# Patient Record
Sex: Female | Born: 1960 | Race: White | Hispanic: No | Marital: Married | Smoking: Current every day smoker
Health system: Southern US, Community
[De-identification: ages and names within clinical notes are randomized; demographics above are authoritative.]

## PROBLEM LIST (undated history)

## (undated) DIAGNOSIS — M47816 Spondylosis without myelopathy or radiculopathy, lumbar region: Secondary | ICD-10-CM

## (undated) DIAGNOSIS — E559 Vitamin D deficiency, unspecified: Secondary | ICD-10-CM

## (undated) DIAGNOSIS — K589 Irritable bowel syndrome without diarrhea: Secondary | ICD-10-CM

## (undated) DIAGNOSIS — E785 Hyperlipidemia, unspecified: Secondary | ICD-10-CM

## (undated) DIAGNOSIS — G589 Mononeuropathy, unspecified: Secondary | ICD-10-CM

## (undated) DIAGNOSIS — K219 Gastro-esophageal reflux disease without esophagitis: Secondary | ICD-10-CM

## (undated) DIAGNOSIS — R1031 Right lower quadrant pain: Secondary | ICD-10-CM

## (undated) DIAGNOSIS — M5430 Sciatica, unspecified side: Secondary | ICD-10-CM

## (undated) DIAGNOSIS — Z9889 Other specified postprocedural states: Secondary | ICD-10-CM

## (undated) DIAGNOSIS — S149XXA Injury of unspecified nerves of neck, initial encounter: Secondary | ICD-10-CM

## (undated) DIAGNOSIS — G43909 Migraine, unspecified, not intractable, without status migrainosus: Secondary | ICD-10-CM

## (undated) DIAGNOSIS — E7800 Pure hypercholesterolemia, unspecified: Secondary | ICD-10-CM

## (undated) DIAGNOSIS — M1990 Unspecified osteoarthritis, unspecified site: Secondary | ICD-10-CM

## (undated) DIAGNOSIS — Z8489 Family history of other specified conditions: Secondary | ICD-10-CM

## (undated) DIAGNOSIS — R112 Nausea with vomiting, unspecified: Secondary | ICD-10-CM

## (undated) HISTORY — DX: Gastro-esophageal reflux disease without esophagitis: K21.9

## (undated) HISTORY — PX: CARPAL TUNNEL RELEASE: SHX101

## (undated) HISTORY — PX: FOOT SURGERY: SHX648

## (undated) HISTORY — DX: Mononeuropathy, unspecified: G58.9

## (undated) HISTORY — PX: SKIN GRAFT: SHX250

## (undated) HISTORY — DX: Migraine, unspecified, not intractable, without status migrainosus: G43.909

## (undated) HISTORY — DX: Irritable bowel syndrome, unspecified: K58.9

## (undated) HISTORY — DX: Pure hypercholesterolemia, unspecified: E78.00

## (undated) HISTORY — DX: Spondylosis without myelopathy or radiculopathy, lumbar region: M47.816

## (undated) HISTORY — DX: Sciatica, unspecified side: M54.30

## (undated) HISTORY — DX: Vitamin D deficiency, unspecified: E55.9

## (undated) HISTORY — PX: DILATION AND CURETTAGE OF UTERUS: SHX78

## (undated) HISTORY — PX: FRACTURE SURGERY: SHX138

## (undated) HISTORY — DX: Hyperlipidemia, unspecified: E78.5

## (undated) HISTORY — DX: Right lower quadrant pain: R10.31

## (undated) HISTORY — PX: CHOLECYSTECTOMY: SHX55

---

## 1998-04-13 ENCOUNTER — Emergency Department (HOSPITAL_COMMUNITY): Admission: EM | Admit: 1998-04-13 | Discharge: 1998-04-13 | Payer: Self-pay | Admitting: Emergency Medicine

## 1998-04-14 ENCOUNTER — Encounter: Payer: Self-pay | Admitting: Emergency Medicine

## 1998-10-07 ENCOUNTER — Inpatient Hospital Stay (HOSPITAL_COMMUNITY): Admission: AD | Admit: 1998-10-07 | Discharge: 1998-10-07 | Payer: Self-pay | Admitting: Obstetrics & Gynecology

## 1998-10-14 ENCOUNTER — Inpatient Hospital Stay (HOSPITAL_COMMUNITY): Admission: AD | Admit: 1998-10-14 | Discharge: 1998-10-14 | Payer: Self-pay | Admitting: Obstetrics

## 1998-10-14 ENCOUNTER — Encounter: Payer: Self-pay | Admitting: Obstetrics

## 1998-10-25 ENCOUNTER — Emergency Department (HOSPITAL_COMMUNITY): Admission: EM | Admit: 1998-10-25 | Discharge: 1998-10-25 | Payer: Self-pay | Admitting: Emergency Medicine

## 1998-10-25 ENCOUNTER — Encounter: Payer: Self-pay | Admitting: Emergency Medicine

## 1998-12-09 ENCOUNTER — Other Ambulatory Visit: Admission: RE | Admit: 1998-12-09 | Discharge: 1998-12-09 | Payer: Self-pay | Admitting: Obstetrics

## 1998-12-16 ENCOUNTER — Encounter (INDEPENDENT_AMBULATORY_CARE_PROVIDER_SITE_OTHER): Payer: Self-pay | Admitting: Specialist

## 1998-12-16 ENCOUNTER — Encounter: Payer: Self-pay | Admitting: Obstetrics

## 1998-12-16 ENCOUNTER — Ambulatory Visit (HOSPITAL_COMMUNITY): Admission: AD | Admit: 1998-12-16 | Discharge: 1998-12-16 | Payer: Self-pay | Admitting: Internal Medicine

## 1999-11-08 ENCOUNTER — Observation Stay (HOSPITAL_COMMUNITY): Admission: RE | Admit: 1999-11-08 | Discharge: 1999-11-09 | Payer: Self-pay | Admitting: *Deleted

## 1999-11-08 ENCOUNTER — Encounter: Payer: Self-pay | Admitting: *Deleted

## 2001-02-05 ENCOUNTER — Emergency Department (HOSPITAL_COMMUNITY): Admission: EM | Admit: 2001-02-05 | Discharge: 2001-02-05 | Payer: Self-pay | Admitting: Emergency Medicine

## 2001-10-28 ENCOUNTER — Ambulatory Visit (HOSPITAL_COMMUNITY): Admission: RE | Admit: 2001-10-28 | Discharge: 2001-10-28 | Payer: Self-pay | Admitting: Obstetrics and Gynecology

## 2001-10-28 ENCOUNTER — Encounter: Payer: Self-pay | Admitting: Obstetrics and Gynecology

## 2002-05-22 ENCOUNTER — Emergency Department (HOSPITAL_COMMUNITY): Admission: EM | Admit: 2002-05-22 | Discharge: 2002-05-22 | Payer: Self-pay | Admitting: Emergency Medicine

## 2002-11-09 ENCOUNTER — Ambulatory Visit (HOSPITAL_COMMUNITY): Admission: RE | Admit: 2002-11-09 | Discharge: 2002-11-09 | Payer: Self-pay | Admitting: Obstetrics and Gynecology

## 2007-09-30 ENCOUNTER — Emergency Department (HOSPITAL_COMMUNITY): Admission: EM | Admit: 2007-09-30 | Discharge: 2007-09-30 | Payer: Self-pay | Admitting: Emergency Medicine

## 2010-08-05 ENCOUNTER — Other Ambulatory Visit: Payer: Self-pay | Admitting: Nurse Practitioner

## 2010-08-05 ENCOUNTER — Other Ambulatory Visit (HOSPITAL_COMMUNITY)
Admission: RE | Admit: 2010-08-05 | Discharge: 2010-08-05 | Disposition: A | Discharge status: Home / Self Care | Payer: Medicaid Other | Source: Ambulatory Visit | Attending: Unknown Physician Specialty | Admitting: Unknown Physician Specialty

## 2010-08-05 ENCOUNTER — Other Ambulatory Visit (HOSPITAL_COMMUNITY)
Admission: RE | Admit: 2010-08-05 | Discharge: 2010-08-05 | Disposition: A | Discharge status: Home / Self Care | Payer: Medicaid Other | Source: Ambulatory Visit | Attending: Nurse Practitioner | Admitting: Nurse Practitioner

## 2010-08-05 DIAGNOSIS — R87610 Atypical squamous cells of undetermined significance on cytologic smear of cervix (ASC-US): Secondary | ICD-10-CM | POA: Insufficient documentation

## 2010-08-05 DIAGNOSIS — N870 Mild cervical dysplasia: Secondary | ICD-10-CM | POA: Insufficient documentation

## 2011-01-15 LAB — COMPREHENSIVE METABOLIC PANEL
AST: 22
BUN: 11
CO2: 29
Calcium: 9.5
Chloride: 105
Creatinine, Ser: 0.8
GFR calc non Af Amer: >60
Glucose, Bld: 100 — ABNORMAL HIGH
Total Bilirubin: 0.7

## 2011-01-15 LAB — TYPE AND SCREEN
ABO/RH(D): A NEG
Antibody Screen: NEGATIVE

## 2011-01-15 LAB — DIFFERENTIAL
Basophils Absolute: 0.1
Eosinophils Relative: 1
Lymphocytes Relative: 21
Lymphs Abs: 2.1
Neutrophils Relative %: 70

## 2011-01-15 LAB — CBC
HCT: 43.8
Hemoglobin: 15.4 — ABNORMAL HIGH
MCHC: 35.2
MCV: 89.8
RBC: 4.89
WBC: 10

## 2013-08-30 ENCOUNTER — Telehealth: Payer: Self-pay | Admitting: *Deleted

## 2013-08-30 NOTE — Telephone Encounter (Signed)
Pt wants to schedule a colonoscopy. Please advise [phone]

## 2013-09-05 NOTE — Telephone Encounter (Signed)
Tried to call with no answer  

## 2013-09-05 NOTE — Telephone Encounter (Signed)
Gastroenterology Pre-Procedure Review  Request Date: Requesting Physician: Robterson  PATIENT REVIEW QUESTIONS: The patient responded to the following health history questions as indicated:    1. Diabetes Melitis: NO 2. Joint replacements in the past 12 months: NO 3. Major health problems in the past 3 months: NO 4. Has an artificial valve or MVP: NO 5. Has a defibrillator: NO 6. Has been advised in past to take antibiotics in advance of a procedure like teeth cleaning: NO 7.Family history: YES,father passed in 2008 from colon cancer 8.[place] :No    MEDICATIONS & ALLERGIES:     Patient reports the following regarding taking any blood thinners:   Plavix? NO Aspirin? NO Coumadin? NO  Patient confirms/reports the following medications:  No current outpatient prescriptions on file.   No current facility-administered medications for this visit.    Patient confirms/reports the following allergies:  Allergies not on file  No orders of the defined types were placed in this encounter.    AUTHORIZATION INFORMATION Primary Insurance: [employer]   ID #: [id number]  Group #:  Pre-Cert / Berkley HarveyAuth required:  Pre-Cert / Auth #:   Secondary Insurance: Medicare  ID #: [id number]: [id number]   Group #:  Pre-Cert / Auth required: Pre-Cert / Auth #:   SCHEDULE INFORMATION: Procedure has been scheduled as follows:  Date: , Time:   Location:   This Gastroenterology Pre-Precedure Review Form is being routed to the following provider(s):

## 2013-09-07 NOTE — Telephone Encounter (Signed)
I called pt to give her appt for the colonoscopy. She said she wants Dr. Karilyn Cotaehman and I gave her his phone number. I am faxing a note to PCP.

## 2013-09-27 ENCOUNTER — Encounter (INDEPENDENT_AMBULATORY_CARE_PROVIDER_SITE_OTHER): Payer: Self-pay | Admitting: *Deleted

## 2013-10-12 ENCOUNTER — Other Ambulatory Visit (INDEPENDENT_AMBULATORY_CARE_PROVIDER_SITE_OTHER): Payer: Self-pay | Admitting: *Deleted

## 2013-10-12 ENCOUNTER — Telehealth (INDEPENDENT_AMBULATORY_CARE_PROVIDER_SITE_OTHER): Payer: Self-pay | Admitting: *Deleted

## 2013-10-12 ENCOUNTER — Ambulatory Visit (INDEPENDENT_AMBULATORY_CARE_PROVIDER_SITE_OTHER): Payer: Medicare HMO | Admitting: Internal Medicine

## 2013-10-12 ENCOUNTER — Encounter (INDEPENDENT_AMBULATORY_CARE_PROVIDER_SITE_OTHER): Payer: Self-pay | Admitting: Internal Medicine

## 2013-10-12 VITALS — BP 120/64 | HR 72 | Temp 97.6°F | Ht 66.0 in | Wt 240.7 lb

## 2013-10-12 DIAGNOSIS — Z1211 Encounter for screening for malignant neoplasm of colon: Secondary | ICD-10-CM

## 2013-10-12 DIAGNOSIS — R1310 Dysphagia, unspecified: Secondary | ICD-10-CM | POA: Diagnosis not present

## 2013-10-12 DIAGNOSIS — E7800 Pure hypercholesterolemia, unspecified: Secondary | ICD-10-CM | POA: Diagnosis not present

## 2013-10-12 MED ORDER — PEG-KCL-NACL-NASULF-NA ASC-C 100 G PO SOLR: 1.0000 | Freq: Once | ORAL | Status: DC

## 2013-10-12 NOTE — Progress Notes (Signed)
Subjective:     Patient ID: Shelly Hanson, female   DOB: 09/14/1960, 53 y.o.   MRN: [id number]  HPI [facility]phagia and screening colonoscopy.  She tells me in 2008 she says she had dysphagia and was dilated by Dr. Karilyn Cotaehman. When she eats, foods are slow to go down. She is having problems swallowing. Symptoms for at least 6 months. When she is lying down, sometimes she cannot catch her breath or swallow.  Meats in particular give her trouble swallowing. She has never undergone a colonoscopy in the past. Appetite for the most part is good. She does have dysphagia. Acid reflux controlled with Omeprazole. No abdominal pain. She usually has a BM every day. No melena. Occasional see blood when she has to strain to have a BM.   05/10/2012 total bili 0.4, ALP 104, AST 18, ALT 19  EGD/ED 01/28/2007: Dr. 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 Small sliding hiatal hernia.   Review of Systems Past Medical History  Diagnosis Date  . High cholesterol     Past Surgical History  Procedure Laterality Date  . Dilation and curettage of uterus      x 2   . Foot surgery      for a fx.   . Cholecystectomy      Allergies  Allergen Reactions  . Codeine   . Flagyl [Metronidazole]   . Sulfa Antibiotics     Current Outpatient Prescriptions on File Prior to Visit  Medication Sig Dispense Refill  . gabapentin (NEURONTIN) 300 MG capsule Take 600 mg by mouth 3 (three) times daily.      . medroxyPROGESTERone (PROVERA) 5 MG tablet Take 5 mg by mouth daily.      . meloxicam (MOBIC) 15 MG tablet Take 15 mg by mouth daily.      Marland Kitchen. omeprazole (PRILOSEC) 40 MG capsule Take 40 mg by mouth daily.      . rosuvastatin (CRESTOR) 20 MG tablet Take 20 mg by mouth daily.      . Vitamin D, Ergocalciferol, (DRISDOL) 50000 UNITS CAPS capsule Take 50,000 Units by mouth every 7 (seven) days.       No current facility-administered  medications on file prior to visit.    Widowed. No children. She is disabled.     Objective:   Physical Exam  Filed Vitals:   10/12/13 0955  BP: 120/64  Pulse: 72  Temp: 97.6 F (36.4 C)  Height: 5\' 6"  (1.676 m)  Weight: 240 lb 11.2 oz (109.181 kg)   Alert and oriented. Skin warm and dry. Oral mucosa is moist.   . Sclera anicteric, conjunctivae is pink. Thyroid not enlarged. No cervical lymphadenopathy. Lungs clear. Heart regular rate and rhythm.  Abdomen is soft. Bowel sounds are positive. No hepatomegaly. No abdominal masses felt. No tenderness.  No edema to lower extremities.        Assessment:    Solid foods dysphagia. Stricture needs to be ruled out. In need of screening colonoscopy.     Plan:     EGD/ED. Screening colonoscopy.The risks and benefits such as perforation, bleeding, and infection were reviewed with the patient and is agreeable.

## 2013-10-12 NOTE — Telephone Encounter (Signed)
Patient needs movi prep 

## 2013-10-12 NOTE — Patient Instructions (Signed)
EGD/ED, Colonoscopy. -The risks and benefits such as perforation, bleeding, and infection were reviewed with the patient and is agreeable. 

## 2013-11-07 ENCOUNTER — Encounter (HOSPITAL_COMMUNITY): Payer: Self-pay | Admitting: Pharmacy Technician

## 2013-11-17 ENCOUNTER — Ambulatory Visit (HOSPITAL_COMMUNITY)
Admission: RE | Admit: 2013-11-17 | Discharge: 2013-11-17 | Disposition: A | Discharge status: Home / Self Care | Payer: Medicare HMO | Source: Ambulatory Visit | Attending: Internal Medicine | Admitting: Internal Medicine

## 2013-11-17 ENCOUNTER — Encounter (HOSPITAL_COMMUNITY)
Admission: RE | Disposition: A | Discharge status: Home / Self Care | Payer: Self-pay | Source: Ambulatory Visit | Attending: Internal Medicine

## 2013-11-17 ENCOUNTER — Encounter (HOSPITAL_COMMUNITY): Payer: Self-pay | Admitting: *Deleted

## 2013-11-17 DIAGNOSIS — Z800 Family history of malignant neoplasm of digestive organs: Secondary | ICD-10-CM | POA: Insufficient documentation

## 2013-11-17 DIAGNOSIS — Z79899 Other long term (current) drug therapy: Secondary | ICD-10-CM | POA: Diagnosis not present

## 2013-11-17 DIAGNOSIS — E7800 Pure hypercholesterolemia, unspecified: Secondary | ICD-10-CM | POA: Diagnosis not present

## 2013-11-17 DIAGNOSIS — K449 Diaphragmatic hernia without obstruction or gangrene: Secondary | ICD-10-CM | POA: Diagnosis not present

## 2013-11-17 DIAGNOSIS — K222 Esophageal obstruction: Secondary | ICD-10-CM | POA: Insufficient documentation

## 2013-11-17 DIAGNOSIS — K219 Gastro-esophageal reflux disease without esophagitis: Secondary | ICD-10-CM | POA: Insufficient documentation

## 2013-11-17 DIAGNOSIS — Z1211 Encounter for screening for malignant neoplasm of colon: Secondary | ICD-10-CM | POA: Diagnosis not present

## 2013-11-17 DIAGNOSIS — K2960 Other gastritis without bleeding: Secondary | ICD-10-CM | POA: Diagnosis not present

## 2013-11-17 DIAGNOSIS — R1310 Dysphagia, unspecified: Secondary | ICD-10-CM

## 2013-11-17 DIAGNOSIS — F17200 Nicotine dependence, unspecified, uncomplicated: Secondary | ICD-10-CM | POA: Insufficient documentation

## 2013-11-17 HISTORY — DX: Mononeuropathy, unspecified: G58.9

## 2013-11-17 HISTORY — PX: BALLOON DILATION: SHX5330

## 2013-11-17 HISTORY — PX: ESOPHAGOGASTRODUODENOSCOPY: SHX5428

## 2013-11-17 HISTORY — PX: MALONEY DILATION: SHX5535

## 2013-11-17 HISTORY — PX: SAVORY DILATION: SHX5439

## 2013-11-17 HISTORY — PX: COLONOSCOPY: SHX5424

## 2013-11-17 HISTORY — DX: Injury of unspecified nerves of neck, initial encounter: S14.9XXA

## 2013-11-17 SURGERY — COLONOSCOPY
Anesthesia: Moderate Sedation

## 2013-11-17 MED ORDER — SIMETHICONE 40 MG/0.6ML PO SUSP
ORAL | Status: DC | PRN
  Administered 2013-11-17: 10:00:00

## 2013-11-17 MED ORDER — MIDAZOLAM HCL 5 MG/5ML IJ SOLN
INTRAMUSCULAR | Status: AC
  Filled 2013-11-17: qty 10

## 2013-11-17 MED ORDER — MEPERIDINE HCL 50 MG/ML IJ SOLN
INTRAMUSCULAR | Status: DC | PRN
  Administered 2013-11-17 (×2): 25 mg via INTRAVENOUS

## 2013-11-17 MED ORDER — SODIUM CHLORIDE 0.9 % IV SOLN
INTRAVENOUS | Status: DC
  Administered 2013-11-17: 10:00:00 via INTRAVENOUS

## 2013-11-17 MED ORDER — MEPERIDINE HCL 50 MG/ML IJ SOLN
INTRAMUSCULAR | Status: AC
  Filled 2013-11-17: qty 1

## 2013-11-17 MED ORDER — MIDAZOLAM HCL 5 MG/5ML IJ SOLN
INTRAMUSCULAR | Status: DC | PRN
  Administered 2013-11-17 (×6): 2 mg via INTRAVENOUS

## 2013-11-17 MED ORDER — BUTAMBEN-TETRACAINE-BENZOCAINE 2-2-14 % EX AERO
INHALATION_SPRAY | CUTANEOUS | Status: DC | PRN
  Administered 2013-11-17: 1 via TOPICAL

## 2013-11-17 MED ORDER — MIDAZOLAM HCL 5 MG/5ML IJ SOLN
INTRAMUSCULAR | Status: AC
  Filled 2013-11-17: qty 5

## 2013-11-17 NOTE — H&P (Signed)
Shelly Hanson is an 53 y.o. female.   Chief Complaint: Patient is here for EGD, ED and colonoscopy. HPI: Patient is 53 year old Caucasian female who has history of erosive reflux esophagitis and esophageal stricture who presents with intermittent solid dysphagia. She has no difficulty with thin liquids. Heart is well controlled with her. Esophageal stricture last dilated in October 2008. She has done well until 2 months ago. She denies abdominal pain melena or rectal bleeding. Family history is positive for coronary carcinoma and father who is 16 at the time of diagnosis and died 34 years later of metastatic disease.  Past Medical History  Diagnosis Date  . High cholesterol   . GERD (gastroesophageal reflux disease)   . Pinched nerve in neck     Past Surgical History  Procedure Laterality Date  . Foot surgery      for a fx.   . Cholecystectomy    . Dilation and curettage of uterus      x 2     Family History  Problem Relation Age of Onset  . Colon cancer Father    Social History:  reports that she has been smoking Cigarettes.  She has a 5 pack-year smoking history. She does not have any smokeless tobacco history on file. She reports that she does not drink alcohol or use illicit drugs.  Allergies:  Allergies  Allergen Reactions  . Codeine   . Flagyl [Metronidazole]   . Sulfa Antibiotics     Medications Prior to Admission  Medication Sig Dispense Refill  . aspirin-acetaminophen-caffeine (EXCEDRIN MIGRAINE) 250-250-65 MG per tablet Take 1 tablet by mouth every 6 (six) hours as needed for headache.       . gabapentin (NEURONTIN) 300 MG capsule Take 600 mg by mouth 3 (three) times daily.      Marland Kitchen GINSENG PO Take 1 tablet by mouth daily.       . medroxyPROGESTERone (PROVERA) 5 MG tablet Take 5 mg by mouth daily.      . meloxicam (MOBIC) 15 MG tablet Take 15 mg by mouth daily.      . Multiple Vitamin (MULTIVITAMIN) tablet Take 1 tablet by mouth daily.      . Multiple  Vitamins-Minerals (PRESERVISION AREDS PO) Take 1 capsule by mouth daily.      . Omega-3 Fatty Acids (FISH OIL) 1000 MG CAPS Take 1 capsule by mouth daily.       Marland Kitchen omeprazole (PRILOSEC) 40 MG capsule Take 40 mg by mouth daily.      . peg 3350 powder (MOVIPREP) 100 G SOLR Take 1 kit (200 g total) by mouth once.  1 kit  0  . rosuvastatin (CRESTOR) 20 MG tablet Take 20 mg by mouth daily.      . Vitamin D, Ergocalciferol, (DRISDOL) 50000 UNITS CAPS capsule Take 50,000 Units by mouth every 7 (seven) days. Takes on Saturdays.        No results found for this or any previous visit (from the past 48 hour(s)). No results found.  ROS  Blood pressure 119/75, pulse 72, temperature 97.8 F (36.6 C), temperature source Oral, resp. rate 18, height '5\' 6"'  (1.676 m), weight 240 lb (108.863 kg), SpO2 96.00%. Physical Exam  Constitutional: She appears well-developed and well-nourished.  HENT:  Mouth/Throat: Oropharynx is clear and moist.  Eyes: Conjunctivae are normal. No scleral icterus.  Neck: No thyromegaly present.  Cardiovascular: Normal rate, regular rhythm and normal heart sounds.   No murmur heard. Respiratory: Effort normal and breath  sounds normal.  GI: Soft. She exhibits no distension and no mass. There is no tenderness.  Musculoskeletal: She exhibits no edema.  Lymphadenopathy:    She has no cervical adenopathy.  Neurological: She is alert.  Skin: Skin is warm and dry.     Assessment/Plan Solid food dysphagia in patient with chronic GERD and esophageal stricture. Family history of colon carcinoma and father at age 79. EGD with ED and screening colonoscopy.   Beckett Hickmon U 11/17/2013, 10:16 AM

## 2013-11-17 NOTE — Op Note (Signed)
EGD PROCEDURE REPORT  PATIENT:  Shelly Hanson  MR#:  [id number] Birthdate:  1960-12-17, 53 y.o., female Endoscopist:  Dr. Malissa Hippo, MD Referred By:  Mr. Quinn Axe, [facility]  Procedure Date: 11/17/2013  Procedure:   EGD, ED & Colonoscopy  Indications:  The patient is a 52 year old Caucasian female with chronic GERD and history of esophageal stricture now presents with solid dysphagia. Heartburn is well controlled with PPI. She is also undergoing screening colonoscopy. Family history significant for CRC in father at age 55 and died of metastatic disease 5 years later.            Informed Consent:  The risks, benefits, alternatives & imponderables which include, but are not limited to, bleeding, infection, perforation, drug reaction and potential missed lesion have been reviewed.  The potential for biopsy, lesion removal, esophageal dilation, etc. have also been discussed.  Questions have been answered.  All parties agreeable.  Please see history & physical in medical record for more information.  Medications:  Demerol 50 mg IV Versed 12 mg IV Cetacaine spray topically for oropharyngeal anesthesia  EGD  Description of procedure:  The endoscope was introduced through the mouth and advanced to the second portion of the duodenum without difficulty or limitations. The mucosal surfaces were surveyed very carefully during advancement of the scope and upon withdrawal.  Findings:  Esophagus:  Mucosa of the esophagus was normal. Soft stricture noted at GE junction without erosions or ulceration. GEJ:  34 cm Hiatus:  38 cm Stomach:  Stomach was empty and distended very well with insufflation. Folds in  the proximal stomach were normal. Examination of mucosa at gastric body was normal. Linear streaks of erythema and erosions noted at antrum. Pyloric channel was patent. Angularis fundus and cardia were unremarkable. Hernia was easily seen on this view. Duodenum:  Normal bulbar and post  bulbar mucosa.  Therapeutic/Diagnostic Maneuvers Performed:   Distal esophageal stricture was dilated with  balloon dilator. Balloon dilator was advanced through the scope. The guidewire was pushed into gastric lumen. Balloon dilator was positioned across the stricture and insufflated to dilator to 15, 16.5 and finally 18 mm. Pressure was maintained for about 30 seconds and then pushed distally. No mucosal disruption induced.  COLONOSCOPY Description of procedure:  After a digital rectal exam was performed, that colonoscope was advanced from the anus through the rectum and colon to the area of the cecum, ileocecal valve and appendiceal orifice. The cecum was deeply intubated. These structures were well-seen and photographed for the record. From the level of the cecum and ileocecal valve, the scope was slowly and cautiously withdrawn. The mucosal surfaces were carefully surveyed utilizing scope tip to flexion to facilitate fold flattening as needed. The scope was pulled down into the rectum where a thorough exam including retroflexion was performed.  Findings:   Prep satisfactory. Normal mucosa of cecum, ileocecal valve, ascending colon, hepatic flexure, transverse colon, splenic flexure, descending and sigmoid colon. Normal mucosa of the rectum and anorectal junction.  Therapeutic/Diagnostic Maneuvers Performed:  None  Complications:  None  Cecal Withdrawal Time:  6 minutes  Impression:   EGD findings; Soft stricture at GE junction without changes of esophagitis. This stricture was dilated with balloon dilator to 18 mm. Moderate size sliding hiatal hernia. Erosive antral gastritis  Colonoscopy findings; Normal colonoscopy.  Comment; Patient was difficult to sedate; will use propofol for subsequent exams.  Recommendations:  Continue anti-reflux measures and omeprazole as before. H. pylori serology. Consider next colonoscopy  in 5 years.  REHMAN,NAJEEB U  11/17/2013 11:15 AM  CC:  Dr. Quinn AxeOBERTSON, ANTHONY T, PA-C & Dr. Bonnetta BarryNo ref. provider found

## 2013-11-17 NOTE — Discharge Instructions (Signed)
Resume usual medications and diet. No driving for 24 hours. Physician will call with the result of blood test. Colonoscopy, Care After Refer to this sheet in the next few weeks. These instructions provide you with information on caring for yourself after your procedure. Your health care provider may also give you more specific instructions. Your treatment has been planned according to current medical practices, but problems sometimes occur. Call your health care provider if you have any problems or questions after your procedure. WHAT TO EXPECT AFTER THE PROCEDURE  After your procedure, it is typical to have the following:  A small amount of blood in your stool.  Moderate amounts of gas and mild abdominal cramping or bloating. HOME CARE INSTRUCTIONS  Do not drive, operate machinery, or sign important documents for 24 hours.  You may shower and resume your regular physical activities, but move at a slower pace for the first 24 hours.  Take frequent rest periods for the first 24 hours.  Walk around or put a warm pack on your abdomen to help reduce abdominal cramping and bloating.  Drink enough fluids to keep your urine clear or pale yellow.  You may resume your normal diet as instructed by your health care provider. Avoid heavy or fried foods that are hard to digest.  Avoid drinking alcohol for 24 hours or as instructed by your health care provider.  Only take over-the-counter or prescription medicines as directed by your health care provider.  If a tissue sample (biopsy) was taken during your procedure:  Do not take aspirin or blood thinners for 7 days, or as instructed by your health care provider.  Do not drink alcohol for 7 days, or as instructed by your health care provider.  Eat soft foods for the first 24 hours. SEEK MEDICAL CARE IF: You have persistent spotting of blood in your stool 2-3 days after the procedure. SEEK IMMEDIATE MEDICAL CARE IF:  You have more than a  small spotting of blood in your stool.  You pass large blood clots in your stool.  Your abdomen is swollen (distended).  You have nausea or vomiting.  You have a fever.  You have increasing abdominal pain that is not relieved with medicine.  Hiatal Hernia A hiatal hernia occurs when part of your stomach slides above the muscle that separates your abdomen from your chest (diaphragm). You can be born with a hiatal hernia (congenital), or it may develop over time. In almost all cases of hiatal hernia, only the top part of the stomach pushes through.  Many people have a hiatal hernia with no symptoms. The larger the hernia, the more likely that you will have symptoms. In some cases, a hiatal hernia allows stomach acid to flow back into the tube that carries food from your mouth to your stomach (esophagus). This may cause heartburn symptoms. Severe heartburn symptoms may mean you have developed a condition called gastroesophageal reflux disease (GERD).  CAUSES  Hiatal hernias are caused by a weakness in the opening (hiatus) where your esophagus passes through your diaphragm to attach to the upper part of your stomach. You may be born with a weakness in your hiatus, or a weakness can develop. RISK FACTORS Older age is a major risk factor for a hiatal hernia. Anything that increases pressure on your diaphragm can also increase your risk of a hiatal hernia. This includes:  Pregnancy.  Excess weight.  Frequent constipation. SIGNS AND SYMPTOMS  People with a hiatal hernia often have no symptoms.  If symptoms develop, they are almost always caused by GERD. They may include:  Heartburn.  Belching.  Indigestion.  Trouble swallowing.  Coughing or wheezing.  Sore throat.  Hoarseness.  Chest pain. DIAGNOSIS  A hiatal hernia is sometimes found during an exam for another problem. Your health care provider may suspect a hiatal hernia if you have symptoms of GERD. Tests may be done to  diagnose GERD. These may include:  X-rays of your stomach or chest.  An upper gastrointestinal (GI) series. This is an X-ray exam of your GI tract involving the use of a chalky liquid that you swallow. The liquid shows up clearly on the X-ray.  Endoscopy. This is a procedure to look into your stomach using a thin, flexible tube that has a tiny camera and light on the end of it. TREATMENT  If you have no symptoms, you may not need treatment. If you have symptoms, treatment may include:  Dietary and lifestyle changes to help reduce GERD symptoms.  Medicines. These may include:  Over-the-counter antacids.  Medicines that make your stomach empty more quickly.  Medicines that block the production of stomach acid (H2 blockers).  Stronger medicines to reduce stomach acid (proton pump inhibitors).  You may need surgery to repair the hernia if other treatments are not helping. HOME CARE INSTRUCTIONS   Take all medicines as directed by your health care provider.  Quit smoking, if you smoke.  Try to achieve and maintain a healthy body weight.  Eat frequent small meals instead of three large meals a day. This keeps your stomach from getting too full.  Eat slowly.  Do not lie down right after eating.  Do noteat 1-2 hours before bed.   Do not drink beverages with caffeine. These include cola, coffee, cocoa, and tea.  Do not drink alcohol.  Avoid foods that can make symptoms of GERD worse. These may include:  Fatty foods.  Citrus fruits.  Other foods and drinks that contain acid.  Avoid putting pressure on your belly. Anything that puts pressure on your belly increases the amount of acid that may be pushed up into your esophagus.   Avoid bending over, especially after eating.  Raise the head of your bed by putting blocks under the legs. This keeps your head and esophagus higher than your stomach.  Do not wear tight clothing around your chest or stomach.  Try not to  strain when having a bowel movement, when urinating, or when lifting heavy objects. SEEK MEDICAL CARE IF:  Your symptoms are not controlled with medicines or lifestyle changes.  You are having trouble swallowing.  You have coughing or wheezing that will not go away. SEEK IMMEDIATE MEDICAL CARE IF:  Your pain is getting worse.  Your pain spreads to your arms, neck, jaw, teeth, or back.  You have shortness of breath.  You sweat for no reason.  You feel sick to your stomach (nauseous) or vomit.  You vomit blood.  You have bright red blood in your stools.  You have black, tarry stools.

## 2013-11-20 LAB — H. PYLORI ANTIBODY, IGG: H Pylori IgG: <0.4 {ISR}

## 2014-02-19 ENCOUNTER — Other Ambulatory Visit (HOSPITAL_COMMUNITY): Payer: Self-pay | Admitting: Respiratory Therapy

## 2014-02-19 DIAGNOSIS — R0681 Apnea, not elsewhere classified: Secondary | ICD-10-CM

## 2014-03-22 ENCOUNTER — Ambulatory Visit: Payer: Medicare HMO | Attending: Physician Assistant | Admitting: Sleep Medicine

## 2014-03-22 DIAGNOSIS — R0681 Apnea, not elsewhere classified: Secondary | ICD-10-CM

## 2014-03-22 DIAGNOSIS — R51 Headache: Secondary | ICD-10-CM | POA: Insufficient documentation

## 2014-03-22 DIAGNOSIS — R0683 Snoring: Secondary | ICD-10-CM | POA: Diagnosis present

## 2014-03-22 DIAGNOSIS — Z7982 Long term (current) use of aspirin: Secondary | ICD-10-CM | POA: Insufficient documentation

## 2014-03-22 DIAGNOSIS — Z79899 Other long term (current) drug therapy: Secondary | ICD-10-CM | POA: Diagnosis not present

## 2014-03-22 DIAGNOSIS — G4733 Obstructive sleep apnea (adult) (pediatric): Secondary | ICD-10-CM | POA: Diagnosis not present

## 2014-03-22 DIAGNOSIS — G4710 Hypersomnia, unspecified: Secondary | ICD-10-CM | POA: Insufficient documentation

## 2014-03-24 NOTE — Sleep Study (Signed)
  [facility] Shelly Limes A. Gerilyn Pilgrimoonquah, MD     [email or web address]        NOCTURNAL POLYSOMNOGRAM    LOCATION: SLEEP LAB FACILITY: [place]   PHYSICIAN: Hula Tasso A. Gerilyn Hanson, M.D.   DATE OF STUDY: 03/22/2014.   REFERRING PHYSICIAN: Ferdie PingAnthony Robertson.   INDICATIONS: The patient is a 53 year old female who presents with hypersomnia, snoring, daytime sleepiness and headaches.  MEDICATIONS:  Prior to Admission medications   Medication Sig Start Date End Date Taking? Authorizing Provider  aspirin-acetaminophen-caffeine (EXCEDRIN MIGRAINE) [id number] MG per tablet Take 1 tablet by mouth every 6 (six) hours as needed for headache.     Historical Provider, MD  gabapentin (NEURONTIN) 300 MG capsule Take 600 mg by mouth 3 (three) times daily.    Historical Provider, MD  GINSENG PO Take 1 tablet by mouth daily.     Historical Provider, MD  medroxyPROGESTERone (PROVERA) 5 MG tablet Take 5 mg by mouth daily.    Historical Provider, MD  meloxicam (MOBIC) 15 MG tablet Take 15 mg by mouth daily.    Historical Provider, MD  Multiple Vitamin (MULTIVITAMIN) tablet Take 1 tablet by mouth daily.    Historical Provider, MD  Multiple Vitamins-Minerals (PRESERVISION AREDS PO) Take 1 capsule by mouth daily.    Historical Provider, MD  Omega-3 Fatty Acids (FISH OIL) 1000 MG CAPS Take 1 capsule by mouth daily.     Historical Provider, MD  omeprazole (PRILOSEC) 40 MG capsule Take 40 mg by mouth daily.    Historical Provider, MD  rosuvastatin (CRESTOR) 20 MG tablet Take 20 mg by mouth daily.    Historical Provider, MD  Vitamin D, Ergocalciferol, (DRISDOL) 50000 UNITS CAPS capsule Take 50,000 Units by mouth every 7 (seven) days. Takes on Saturdays.    Historical Provider, MD      EPWORTH SLEEPINESS SCALE: 9.   BMI: 40.   ARCHITECTURAL SUMMARY: Total recording time was 484 minutes. Sleep efficiency 77 %. Sleep latency 15 minutes. REM latency 98 minutes. Stage NI 5 %, N2 56 % and N3 22 % and REM sleep 17 %.      RESPIRATORY DATA:  Baseline oxygen saturation is 96 %. The lowest saturation is 82 %. The diagnostic AHI is 10. The RDI is 11. The REM AHI is 24.  LIMB MOVEMENT SUMMARY: PLM index 0.   ELECTROCARDIOGRAM SUMMARY: Average heart rate is 71 with no significant dysrhythmias observed.   IMPRESSION:  1. 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 A formal CPAP titration recording is suggested.  Thanks for this referral.  Shavonna Corella A. Gerilyn Hanson, M.D. Diplomat, Biomedical engineerAmerican Board of Sleep Medicine.

## 2014-04-05 ENCOUNTER — Encounter (INDEPENDENT_AMBULATORY_CARE_PROVIDER_SITE_OTHER): Payer: Self-pay

## 2014-05-31 ENCOUNTER — Encounter (INDEPENDENT_AMBULATORY_CARE_PROVIDER_SITE_OTHER): Payer: Self-pay | Admitting: *Deleted

## 2014-06-14 ENCOUNTER — Other Ambulatory Visit (HOSPITAL_COMMUNITY): Payer: Self-pay | Admitting: Radiology

## 2014-06-14 DIAGNOSIS — G4730 Sleep apnea, unspecified: Secondary | ICD-10-CM

## 2014-08-27 ENCOUNTER — Encounter: Payer: Self-pay | Admitting: *Deleted

## 2014-09-03 ENCOUNTER — Encounter: Payer: Self-pay | Admitting: Obstetrics and Gynecology

## 2014-09-03 ENCOUNTER — Ambulatory Visit (INDEPENDENT_AMBULATORY_CARE_PROVIDER_SITE_OTHER): Payer: Medicare HMO | Admitting: Obstetrics and Gynecology

## 2014-09-03 VITALS — BP 120/82 | Ht 65.0 in | Wt 233.0 lb

## 2014-09-03 DIAGNOSIS — R1031 Right lower quadrant pain: Secondary | ICD-10-CM

## 2014-09-03 NOTE — Progress Notes (Signed)
Patient ID: Shelly GuessDebra Holley Hanson, female   DOB: 01/30/1961, 54 y.o.   MRN: [id number] Pt here today for abdominal and pain on her right side. Pt states that the pain in her right side comes and goes. Pt states that with certain positions during sex the pain in her right side is worse. Pt also wants to discuss the medroxyprogesterone and her hot flashes. Pt wants to dicsuss what to do about the pain and what her options are.
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
## 2014-09-04 DIAGNOSIS — R1031 Right lower quadrant pain: Secondary | ICD-10-CM | POA: Insufficient documentation

## 2014-09-04 HISTORY — DX: Right lower quadrant pain: R10.31

## 2014-09-10 ENCOUNTER — Other Ambulatory Visit (HOSPITAL_COMMUNITY): Payer: Self-pay | Admitting: Physician Assistant

## 2014-09-10 DIAGNOSIS — M5440 Lumbago with sciatica, unspecified side: Secondary | ICD-10-CM

## 2014-09-19 ENCOUNTER — Ambulatory Visit (HOSPITAL_COMMUNITY)
Admission: RE | Admit: 2014-09-19 | Discharge: 2014-09-19 | Disposition: A | Discharge status: Home / Self Care | Payer: Medicare HMO | Source: Ambulatory Visit | Attending: Physician Assistant | Admitting: Physician Assistant

## 2014-09-19 DIAGNOSIS — M5440 Lumbago with sciatica, unspecified side: Secondary | ICD-10-CM | POA: Diagnosis not present

## 2014-10-04 ENCOUNTER — Ambulatory Visit (INDEPENDENT_AMBULATORY_CARE_PROVIDER_SITE_OTHER): Payer: Medicare HMO | Admitting: Obstetrics and Gynecology

## 2014-10-04 ENCOUNTER — Encounter: Payer: Self-pay | Admitting: Obstetrics and Gynecology

## 2014-10-04 VITALS — BP 112/70 | Ht 65.0 in | Wt 230.0 lb

## 2014-10-04 DIAGNOSIS — N951 Menopausal and female climacteric states: Secondary | ICD-10-CM | POA: Diagnosis not present
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
## 2014-10-04 NOTE — Patient Instructions (Signed)
PLEASE SIGN UP FOR MYCHART !!!!  [facility] really wants you to be able to see the results of tests that you have taken in their offices and facilities. YOU CAN HELP.  Please sign up for [facility], the way we nurses, doctors, and other care providers can get information to you quickly and efficiently. At each visit to a [facility] facility, you are given a password that will allow you, for 30 days, to sign up for [facility]. Please use it to sign up,( or get your favorite teenager with computer skills to help you), and then medical results can be sent to you once the doctor or nurse reviews them and release the results to you.  You will also be able to send notes to your provider offices, when questions arise.  This is a tremendous opportunity for you to BECOME MORE INVOLVED IN YOUR OWN CARE.  After all, no one can do as much for your health and well being as you can do. This is one of the best ways you can become a partner with your health care team, and feel like you are more in charge of your own health.

## 2014-10-05 ENCOUNTER — Ambulatory Visit: Payer: Medicare HMO | Admitting: Obstetrics and Gynecology

## 2014-10-05 LAB — ESTRADIOL: Estradiol: 8.6 pg/mL

## 2014-10-16 ENCOUNTER — Ambulatory Visit (INDEPENDENT_AMBULATORY_CARE_PROVIDER_SITE_OTHER): Payer: Medicare HMO | Admitting: Internal Medicine

## 2014-10-16 ENCOUNTER — Telehealth: Payer: Self-pay | Admitting: Obstetrics and Gynecology

## 2014-10-18 ENCOUNTER — Telehealth: Payer: Self-pay | Admitting: Obstetrics and Gynecology

## 2014-10-18 NOTE — Telephone Encounter (Signed)
Pt states had labs at last visit for menopause, has not heard back in regards to labs and what she needs to take for hot flashes. Please advise.

## 2014-10-19 NOTE — Telephone Encounter (Signed)
Estradiol levels low, FSH not in record yet. Plan: will ask chrystal to confirm that lab was collected.

## 2014-10-24 NOTE — Telephone Encounter (Signed)
[facility]

## 2014-10-25 ENCOUNTER — Encounter (INDEPENDENT_AMBULATORY_CARE_PROVIDER_SITE_OTHER): Payer: Self-pay | Admitting: Internal Medicine

## 2014-10-25 ENCOUNTER — Telehealth: Payer: Self-pay | Admitting: *Deleted

## 2014-10-25 ENCOUNTER — Ambulatory Visit (INDEPENDENT_AMBULATORY_CARE_PROVIDER_SITE_OTHER): Payer: Medicare HMO | Admitting: Internal Medicine

## 2014-10-25 VITALS — BP 126/64 | HR 64 | Temp 97.9°F | Ht 65.5 in | Wt 227.8 lb

## 2014-10-25 DIAGNOSIS — R1314 Dysphagia, pharyngoesophageal phase: Secondary | ICD-10-CM

## 2014-10-25 DIAGNOSIS — Z800 Family history of malignant neoplasm of digestive organs: Secondary | ICD-10-CM | POA: Diagnosis not present

## 2014-10-25 DIAGNOSIS — K648 Other hemorrhoids: Secondary | ICD-10-CM | POA: Diagnosis not present

## 2014-10-25 DIAGNOSIS — N951 Menopausal and female climacteric states: Secondary | ICD-10-CM

## 2014-10-25 MED ORDER — HYDROCORTISONE 2.5 % RE CREA: 1.0000 "application " | TOPICAL_CREAM | Freq: Two times a day (BID) | RECTAL | Status: DC

## 2014-10-25 NOTE — Progress Notes (Signed)
Subjective:    Patient ID: Shelly Hanson, female    DOB: 09/16/1960, 54 y.o.   MRN: [id number]  HPI  Here today for f/u. Hx of dysphagia and underwent and EGD/ED last year. She also underwent a colonoscopy for family hx of colon cancer in father who died at age 56.  She tells me today her swallowing is good. Occasionally her hernia gives her trouble.  She says her hemorrhoids are bothering her at times when she strains to have a BM.  Appetite is good. She has had some weight loss which was intentional. Her acid reflux is controlled with PPI.  She usually has a BM daily. She has seen some blood with her BM.          11/17/2013 EGD, ED & Colonoscopy  Indications: The patient is a 54 year old Caucasian female with chronic GERD and history of esophageal stricture now presents with solid dysphagia. Heartburn is well controlled with PPI. She is also undergoing screening colonoscopy. Family history significant for CRC in father at age 56 and died of metastatic disease 5 years later.  EGD findings; Soft stricture at GE junction without changes of esophagitis. This stricture was dilated with balloon dilator to 18 mm. Moderate size sliding hiatal hernia. Erosive antral gastritis  Colonoscopy findings; Normal colonoscopy.  Review of Systems Past Medical History  Diagnosis Date  . High cholesterol   . GERD (gastroesophageal reflux disease)   . Pinched nerve in neck   . Vitamin D deficiency disease   . Hyperlipemia   . Sciatic leg pain   . Pinched nerve     pinched nerve in back     Past Surgical History  Procedure Laterality Date  . Foot surgery      for a fx.   . Cholecystectomy    . Dilation and curettage of uterus      x 2   . Colonoscopy N/A 11/17/2013    Procedure: COLONOSCOPY;  Surgeon: Malissa HippoNajeeb U Rehman, MD;  [facility];  Service:  Endoscopy;  Laterality: N/A;  1030  . Esophagogastroduodenoscopy N/A 11/17/2013    Procedure: ESOPHAGOGASTRODUODENOSCOPY (EGD);  Surgeon: Malissa HippoNajeeb U Rehman, MD;  [facility];  Service: Endoscopy;  Laterality: N/A;  . Balloon dilation N/A 11/17/2013    Procedure: BALLOON DILATION;  Surgeon: Malissa HippoNajeeb U Rehman, MD;  [facility];  Service: Endoscopy;  Laterality: N/A;  [name]. [name] N/A 11/17/2013    Procedure: Elease HashimotoMALONEY DILATION;  Surgeon: Malissa HippoNajeeb U Rehman, MD;  [facility];  Service: Endoscopy;  Laterality: N/A;  . Savory dilation N/A 11/17/2013    Procedure: SAVORY DILATION;  Surgeon: Malissa HippoNajeeb U Rehman, MD;  [facility];  Service: Endoscopy;  Laterality: N/A;    Allergies  Allergen Reactions  . Codeine   . Flagyl [Metronidazole]   . Sulfa Antibiotics     Current Outpatient Prescriptions on File Prior to Visit  Medication Sig Dispense Refill  . aspirin-acetaminophen-caffeine (EXCEDRIN MIGRAINE) 250-250-65 MG per tablet Take 1 tablet by mouth every 6 (six) hours as needed for headache.     . docusate sodium (COLACE) 100 MG capsule Take 100 mg by mouth daily.    . fluticasone (VERAMYST) 27.5 MCG/SPRAY nasal spray Place 2 sprays into the nose daily.    Marland Kitchen. gabapentin (NEURONTIN) 300 MG capsule Take 600 mg by mouth 3 (three) times daily.    Marland Kitchen. GINSENG PO Take 1 tablet by mouth daily.     Marland Kitchen. omeprazole (  PRILOSEC) 40 MG capsule Take 40 mg by mouth daily.    . ranitidine (ZANTAC) 150 MG tablet Take 150 mg by mouth 2 (two) times daily.    . rosuvastatin (CRESTOR) 20 MG tablet Take 20 mg by mouth daily.    . Vitamin D, Ergocalciferol, (DRISDOL) 50000 UNITS CAPS capsule Take 50,000 Units by mouth every 7 (seven) days. Takes on Saturdays.     No current facility-administered medications on file prior to visit.        Objective:   Physical Exam Blood pressure 126/64, pulse 64, temperature 97.9 F (36.6 C), height 5' 5.5" (1.664 m), weight 227 lb 12.8 oz  (103.329 kg). Alert and oriented. Skin warm and dry. Oral mucosa is moist.   . Sclera anicteric, conjunctivae is pink. Thyroid not enlarged. No cervical lymphadenopathy. Lungs clear. Heart regular rate and rhythm.  Abdomen is soft. Bowel sounds are positive. No hepatomegaly. No abdominal masses felt. No tenderness.  No edema to lower extremities.          Assessment & Plan:  Dysphaigia. Continue PPI Family hx of colon cancer. Next colonoscopy 4 yrs from now. Hemorrhoids: Anusol cream twice a day. Please call with a PR report next week.  OV in 1 year.

## 2014-10-25 NOTE — Patient Instructions (Addendum)
Continue Protonix OV in1 year. Take Anusol cream twice a day for your hemorrhoids

## 2014-10-25 NOTE — Telephone Encounter (Signed)
Pt aware that she needs to go by [facility] to have FSH drawn. Pt has another appointment today and will go by there after that.

## 2014-10-26 ENCOUNTER — Encounter (INDEPENDENT_AMBULATORY_CARE_PROVIDER_SITE_OTHER): Payer: Self-pay | Admitting: Internal Medicine

## 2014-10-26 LAB — FOLLICLE STIMULATING HORMONE: FSH: 45.8 m[IU]/mL

## 2014-10-29 ENCOUNTER — Telehealth: Payer: Self-pay | Admitting: Obstetrics and Gynecology

## 2014-10-29 NOTE — Telephone Encounter (Signed)
Pt wanted blood work results. Pt aware of lab results. Pt wanted to know if Dr. Emelda FearFerguson could change her medication for her hot flashes. The pt has a follow up appointment on the 29th with Dr. Emelda FearFerguson. I advised the pt that I would send this message to D.r Emelda FearFerguson and see if he wants to go ahead and give her medication to try.

## 2014-11-12 ENCOUNTER — Telehealth: Payer: Self-pay | Admitting: *Deleted

## 2014-11-12 MED ORDER — MEDROXYPROGESTERONE ACETATE 5 MG PO TABS: 5.0000 mg | ORAL_TABLET | Freq: Every day | ORAL | Status: DC

## 2014-11-12 MED ORDER — ESTRADIOL 1 MG PO TABS: 1.0000 mg | ORAL_TABLET | Freq: Every day | ORAL | Status: DC

## 2014-11-12 NOTE — Telephone Encounter (Signed)
Rx estradiol 1 mg daily continusous ly   Rx Provera   x 14 d q 3 months.

## 2014-11-12 NOTE — Telephone Encounter (Signed)
Pt states that her hot flashes are getting worse and now she is experiencing nausea. Pt states that she was taking meloxicam and Dr. Emelda Fear told her to stop taking it because it wasn't helping. Pt wants to know what she can do. Pt has appointment on this Friday.

## 2014-11-16 ENCOUNTER — Ambulatory Visit: Payer: Medicare HMO | Admitting: Obstetrics and Gynecology

## 2014-11-22 ENCOUNTER — Encounter: Payer: Self-pay | Admitting: Obstetrics and Gynecology

## 2014-11-22 ENCOUNTER — Ambulatory Visit (INDEPENDENT_AMBULATORY_CARE_PROVIDER_SITE_OTHER): Payer: Medicare HMO | Admitting: Obstetrics and Gynecology

## 2014-11-22 VITALS — BP 110/80 | HR 76 | Ht 65.0 in | Wt 225.0 lb

## 2014-11-22 DIAGNOSIS — N951 Menopausal and female climacteric states: Secondary | ICD-10-CM

## 2014-11-22 MED ORDER — ESTRADIOL 1 MG PO TABS: 1.0000 mg | ORAL_TABLET | Freq: Every day | ORAL | Status: DC

## 2014-11-22 NOTE — Progress Notes (Signed)
Patient ID: Shelly Hanson, female   DOB: 05-Jul-1960, 54 y.o.   MRN: [id number]    [facility]  Patient name: Shelly Hanson MRN [id number]  Date of birth: 1960/12/02  CC & HPI:  Shelly Hanson is a 54 y.o. post-menopausal female presenting today for continued, intermittent hot flashes and medication consultation. Pt was seen on 6/16 for the same, was taken off Meloxicam and was started on estradiol-1 mg daily and Provera-5 mg for 14 days every 3 months. She states that her hot flashes have become worse, and are now started on nausea, since starting on the new medications. Her last menses was 4 years ago. Pt denies dyspareunia.   ROS:  A complete 10 system review of systems was obtained and all systems are negative except as noted in the HPI and PMH.   Pertinent History Reviewed:   Reviewed. Medical         Past Medical History  Diagnosis Date   High cholesterol    GERD (gastroesophageal reflux disease)    Pinched nerve in neck    Vitamin D deficiency disease    Hyperlipemia    Sciatic leg pain    Pinched nerve     pinched nerve in back                               Surgical Hx:    Past Surgical History  Procedure Laterality Date   Foot surgery      for a fx.    Cholecystectomy     Dilation and curettage of uterus      x 2    Colonoscopy N/A 11/17/2013    Procedure: COLONOSCOPY;  Surgeon: Malissa Hippo, MD;  [facility];  Service: Endoscopy;  Laterality: N/A;  1030   Esophagogastroduodenoscopy N/A 11/17/2013    Procedure: ESOPHAGOGASTRODUODENOSCOPY (EGD);  Surgeon: Malissa Hippo, MD;  [facility];  Service: Endoscopy;  Laterality: N/A;   Balloon dilation N/A 11/17/2013    Procedure: BALLOON DILATION;  Surgeon: Malissa Hippo, MD;  [facility];  Service: Endoscopy;  Laterality: N/AElease Hashimoto dilation N/A 11/17/2013    Procedure: [name] DILATION;  Surgeon: Malissa Hippo, MD;  [facility];  Service: Endoscopy;  Laterality: N/A;   Savory dilation N/A 11/17/2013    Procedure: SAVORY DILATION;  Surgeon: Malissa Hippo, MD;  [facility];  Service: Endoscopy;  Laterality: N/A;   Medications: Reviewed & Updated - see associated section                       Current outpatient prescriptions:    aspirin-acetaminophen-caffeine (EXCEDRIN MIGRAINE) 250-250-65 MG per tablet, Take 1 tablet by mouth every 6 (six) hours as needed for headache. , Disp: , Rfl:    docusate sodium (COLACE) 100 MG capsule, Take 100 mg by mouth daily., Disp: , Rfl:    estradiol (ESTRACE) 1 MG tablet, Take 1 tablet (1 mg total) by mouth daily., Disp: 30 tablet, Rfl: 11   fluticasone (VERAMYST) 27.5 MCG/SPRAY nasal spray, Place 2 sprays into the nose daily., Disp: , Rfl:    gabapentin (NEURONTIN) 300 MG capsule, Take 600 mg by mouth 3 (three) times daily., Disp: , Rfl:    GINSENG PO, Take 1 tablet by mouth daily. , Disp: , Rfl:    hydrocortisone (ANUSOL-HC) 2.5 % rectal  cream, Place 1 application rectally 2 (two) times daily., Disp: 30 g, Rfl: 1   omeprazole (PRILOSEC) 40 MG capsule, Take 40 mg by mouth daily., Disp: , Rfl:    ranitidine (ZANTAC) 150 MG tablet, Take 150 mg by mouth 2 (two) times daily., Disp: , Rfl:    rosuvastatin (CRESTOR) 20 MG tablet, Take 20 mg by mouth daily., Disp: , Rfl:    Vitamin D, Ergocalciferol, (DRISDOL) 50000 UNITS CAPS capsule, Take 50,000 Units by mouth every 7 (seven) days. Takes on Saturdays., Disp: , Rfl:    medroxyPROGESTERone (PROVERA) 5 MG tablet, Take 1 tablet (5 mg total) by mouth daily. One tablet daily x 2 weeks. Repeat as directed every third month. (Patient not taking: Reported on 11/22/2014), Disp: 14 tablet, Rfl: 3   Social History: Reviewed -  reports that she has been smoking Cigarettes.  She has a 2.5 pack-year smoking history. She has never used smokeless tobacco.  Objective Findings:  Vitals: Blood pressure 110/80, pulse 76, height 5'  5" (1.651 m), weight 225 lb (102.059 kg).  Physical Examination: General appearance - alert, well appearing, and in no distress and oriented to person, place, and time Mental status - alert, oriented to person, place, and time, normal mood, behavior, speech, dress, motor activity, and thought processes  Discussion only 20 minutes regarding treatment plan, side effects and risks.   Assessment & Plan:   A:  1. Post-menopausal female 2. Intermittent hot flashes  P:  1. Pt to continue estradiol 1 mg and Provera  5mg  x 14 d q 3months  2. Follow-up, as needed or annually.    This chart was scribed for Tilda Burrow, MD by Gwenyth Ober, Medical Scribe. This patient was seen in room 1 and the patient's care was started at 1:49 PM.   I personally performed the services described in this documentation, which was SCRIBED in my presence. The recorded information has been reviewed and considered accurate. It has been edited as necessary during review. Tilda Burrow, MD

## 2014-12-13 ENCOUNTER — Telehealth: Payer: Self-pay | Admitting: Obstetrics and Gynecology

## 2014-12-13 NOTE — Telephone Encounter (Signed)
Pt states that the new medication that she was given for hot flashes has given her migraines, and makes her nauseated. Pt states that the hot flashes are better but still there. Pt states that she has taken excedrin migraine and that has not helped.

## 2014-12-17 ENCOUNTER — Telehealth: Payer: Self-pay | Admitting: Obstetrics and Gynecology

## 2014-12-18 ENCOUNTER — Telehealth: Payer: Self-pay | Admitting: Obstetrics and Gynecology

## 2014-12-18 DIAGNOSIS — G43909 Migraine, unspecified, not intractable, without status migrainosus: Secondary | ICD-10-CM

## 2014-12-18 DIAGNOSIS — G43009 Migraine without aura, not intractable, without status migrainosus: Secondary | ICD-10-CM

## 2014-12-18 HISTORY — DX: Migraine, unspecified, not intractable, without status migrainosus: G43.909

## 2014-12-18 MED ORDER — BUTALBITAL-APAP-CAFFEINE 50-325-40 MG PO TABS: 1.0000 | ORAL_TABLET | Freq: Four times a day (QID) | ORAL | Status: DC | PRN

## 2014-12-18 NOTE — Telephone Encounter (Signed)
I spoke with the pt and she stated that she still has the same headache from last week. Pt states that she really need something for the headache and would really like for Dr. Emelda Fear to give her a call about the problems she is having. I offered the pt an appointment but the pt refused and stated that she doesn't have any money or gas until Thursday. I advised the pt that Dr.Ferguson is not in the office until this afternoon and that I would try my best to get him to call her this afternoon. Pt verbalized understanding.

## 2014-12-18 NOTE — Telephone Encounter (Signed)
Chronic persistent headache x 5day, no vision changes, + dizzy today,   Has had infrequent headache had headache "not that many, , not relieved by excedrin Migraine this time. Having photophobia, and pain in eyes.  Will send rx for Fioricet to [place] [facility].,

## 2015-01-14 ENCOUNTER — Telehealth: Payer: Self-pay | Admitting: Obstetrics and Gynecology

## 2015-01-14 NOTE — Telephone Encounter (Signed)
Pt states that she is still having the headaches and they are lasting a few days at a time. Pt states that the hot flashes have not bad anymore but the headaches are still bad. Pt was advised she would need an appointment to discuss medications. Pt verbalized understanding. Phone call was switched to front office for appointment.

## 2015-01-18 ENCOUNTER — Ambulatory Visit (INDEPENDENT_AMBULATORY_CARE_PROVIDER_SITE_OTHER): Payer: Medicare HMO | Admitting: Obstetrics and Gynecology

## 2015-01-18 ENCOUNTER — Encounter: Payer: Self-pay | Admitting: Obstetrics and Gynecology

## 2015-01-18 VITALS — BP 118/70 | Ht 65.0 in | Wt 224.0 lb

## 2015-01-18 DIAGNOSIS — G44209 Tension-type headache, unspecified, not intractable: Secondary | ICD-10-CM | POA: Diagnosis not present

## 2015-01-18 MED ORDER — TRAMADOL HCL 50 MG PO TABS: 50.0000 mg | ORAL_TABLET | Freq: Four times a day (QID) | ORAL | Status: DC | PRN

## 2015-01-18 MED ORDER — BUTALBITAL-APAP-CAFFEINE 50-325-40 MG PO CAPS: 1.0000 | ORAL_CAPSULE | Freq: Four times a day (QID) | ORAL | Status: DC | PRN

## 2015-01-18 NOTE — Progress Notes (Signed)
Patient ID: Shelly Hanson, female   DOB: 04-19-1961, 54 y.o.   MRN: [id number]   [facility] Visit  Patient name: Shelly Hanson MRN [id number]  Date of birth: [birth year] CC & HPI:  Shelly Hanson is a 54 y.o. female, with last menses 4 years ago, presenting today for f/u on Medroxyprogest (  1x daily), Butalb-Acetamin (1-2 tabs daily), and Estradiol (  daily). Pt was started on said meds to control her hot flashes. Pt reports headaches with associated photophobia after starting said meds-- pt notes, however, she has been under increased stress lately due to family issues. Pt notes pulling sensation/pain to base of neck as well as scalp. At baseline, pt reports headaches approximately 2x per month. t reports using Excedrin Migraine at home without relief. Pt denies visual changes or visual disturbance. Pt further denies sleep disturbance due to her hot flashes. Pt reports her BP is normal.   Pt also complains of an ongoing circular, red bruise to LLE.   ROS:  10 Systems reviewed and all are negative for acute change except as noted in the HPI. Pertinent History Reviewed:   Reviewed: Significant for dilation and curettage of uterus,  Medical         Past Medical History  Diagnosis Date   High cholesterol    GERD (gastroesophageal reflux disease)    Pinched nerve in neck    Vitamin D deficiency disease    Hyperlipemia    Sciatic leg pain    Pinched nerve     pinched nerve in back                               Surgical Hx:    Past Surgical History  Procedure Laterality Date   Foot surgery      for a fx.    Cholecystectomy     Dilation and curettage of uterus      x 2    Colonoscopy N/A 11/17/2013    Procedure: COLONOSCOPY;  Surgeon: Malissa Hippo, MD;  [facility];  Service: Endoscopy;  Laterality: N/A;  1030   Esophagogastroduodenoscopy N/A 11/17/2013    Procedure: ESOPHAGOGASTRODUODENOSCOPY (EGD);  Surgeon: Malissa Hippo, MD;   [facility];  Service: Endoscopy;  Laterality: N/A;   Balloon dilation N/A 11/17/2013    Procedure: BALLOON DILATION;  Surgeon: Malissa Hippo, MD;  [facility];  Service: Endoscopy;  Laterality: N/AElease Hanson dilation N/A 11/17/2013    Procedure: Shelly Hanson DILATION;  Surgeon: Malissa Hippo, MD;  [facility];  Service: Endoscopy;  Laterality: N/A;   Savory dilation N/A 11/17/2013    Procedure: SAVORY DILATION;  Surgeon: Malissa Hippo, MD;  [facility];  Service: Endoscopy;  Laterality: N/A;   Medications: Reviewed & Updated - see associated section                       Current outpatient prescriptions:    aspirin-acetaminophen-caffeine (EXCEDRIN MIGRAINE) 250-250-65 MG per tablet, Take 1 tablet by mouth every 6 (six) hours as needed for headache. , Disp: , Rfl:    butalbital-acetaminophen-caffeine (FIORICET) 50-325-40 MG per tablet, Take 1-2 tablets by mouth every 6 (six) hours as needed for headache or migraine., Disp: 40 tablet, Rfl: 0   Butalbital-APAP-Caffeine (CAPACET) 50-325-40 MG capsule, Take 1 capsule by mouth every 6 (six) hours as needed for pain  or headache., Disp: 90 capsule, Rfl: 2   docusate sodium (COLACE) 100 MG capsule, Take 100 mg by mouth daily., Disp: , Rfl:    estradiol (ESTRACE) 1 MG tablet, Take 1 tablet (1 mg total) by mouth daily., Disp: 30 tablet, Rfl: 11   fluticasone (VERAMYST) 27.5 MCG/SPRAY nasal spray, Place 2 sprays into the nose daily., Disp: , Rfl:    gabapentin (NEURONTIN) 300 MG capsule, Take 600 mg by mouth 3 (three) times daily., Disp: , Rfl:    GINSENG PO, Take 1 tablet by mouth daily. , Disp: , Rfl:    hydrocortisone (ANUSOL-HC) 2.5 % rectal cream, Place 1 application rectally 2 (two) times daily., Disp: 30 g, Rfl: 1   medroxyPROGESTERone (PROVERA) 5 MG tablet, Take 1 tablet (5 mg total) by mouth daily. One tablet daily x 2 weeks. Repeat as directed every third month. (Patient not taking:  Reported on 11/22/2014), Disp: 14 tablet, Rfl: 3   omeprazole (PRILOSEC) 40 MG capsule, Take 40 mg by mouth daily., Disp: , Rfl:    ranitidine (ZANTAC) 150 MG tablet, Take 150 mg by mouth 2 (two) times daily., Disp: , Rfl:    rosuvastatin (CRESTOR) 20 MG tablet, Take 20 mg by mouth daily., Disp: , Rfl:    traMADol (ULTRAM) 50 MG tablet, Take 1 tablet (50 mg total) by mouth every 6 (six) hours as needed for moderate pain or severe pain., Disp: 30 tablet, Rfl: 0   Vitamin D, Ergocalciferol, (DRISDOL) 50000 UNITS CAPS capsule, Take 50,000 Units by mouth every 7 (seven) days. Takes on Saturdays., Disp: , Rfl:   Social History: Reviewed -  reports that she has been smoking Cigarettes.  She has a 2.5 pack-year smoking history. She has never used smokeless tobacco.  Objective Findings:  Vitals: Blood pressure 118/70, height  (1.651 m), weight 224 lb (101.606 kg).  Physical Examination: Office Visit for Discussion Only   Assessment & Plan:   A:  1. 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   P:  1. Continue Butalb-Acetamin  2. Rx for Fioricet  3. Tramadol for severe headaches when Fioricet does not work.  4. Continue estrogen  5. F/U in three months   By signing my name below, I, Marica Otter, attest that this documentation has been prepared under the direction and in the presence of Christin Bach, MD. Electronically Signed: Marica Otter, ED Scribe. 01/18/2015. 12:47 PM.   I personally performed the services described in this documentation, which was SCRIBED in my presence. The recorded information has been reviewed and considered accurate. It has been edited as necessary during review. Tilda Burrow, MD

## 2015-01-18 NOTE — Progress Notes (Signed)
Patient ID: Shelly Hanson, female   DOB: June 03, 1960, 54 y.o.   MRN: [id number] Pt here today for follow up on medications. Pt states that she is still having headaches.

## 2015-01-26 NOTE — Progress Notes (Signed)
Patient ID: Shelly Hanson, female   DOB: 1960-05-31, 54 y.o.   MRN: [id number]   [facility] Visit  Patient name: Shelly Hanson MRN [id number]  Date of birth: Sep 16, 1960 CC & HPI:  Shelly Hanson is a 54 y.o. female, with last menses 4 years ago, presenting today for f/u on Medroxyprogest (  1x daily), Butalb-Acetamin (1-2 tabs daily), and Estradiol (  daily). Pt was started on said meds to control her hot flashes. Pt reports headaches with associated photophobia after starting said meds-- pt notes, however, she has been under increased stress lately due to family issues. Pt notes pulling sensation/pain to base of neck as well as scalp. At baseline, pt reports headaches approximately 2x per month. t reports using Excedrin Migraine at home without relief. Pt denies visual changes or visual disturbance. Pt further denies sleep disturbance due to her hot flashes. Pt reports her BP is normal.   Pt also complains of an ongoing circular, red bruise to LLE.   ROS:  10 Systems reviewed and all are negative for acute change except as noted in the HPI. Pertinent History Reviewed:   Reviewed: Significant for dilation and curettage of uterus,  Medical         Past Medical History  Diagnosis Date  . High cholesterol   . GERD (gastroesophageal reflux disease)   . Pinched nerve in neck   . Vitamin D deficiency disease   . Hyperlipemia   . Sciatic leg pain   . Pinched nerve     pinched nerve in back                               Surgical Hx:    Past Surgical History  Procedure Laterality Date  . Foot surgery      for a fx.   . Cholecystectomy    . Dilation and curettage of uterus      x 2   . Colonoscopy N/A 11/17/2013    Procedure: COLONOSCOPY;  Surgeon: Malissa Hippo, MD;  [facility];  Service: Endoscopy;  Laterality: N/A;  1030  . Esophagogastroduodenoscopy N/A 11/17/2013    Procedure: ESOPHAGOGASTRODUODENOSCOPY (EGD);  Surgeon: Malissa Hippo, MD;   [facility];  Service: Endoscopy;  Laterality: N/A;  . Balloon dilation N/A 11/17/2013    Procedure: BALLOON DILATION;  Surgeon: Malissa Hippo, MD;  [facility];  Service: Endoscopy;  Laterality: N/A;  [name] dilation N/A 11/17/2013    Procedure: [name] DILATION;  Surgeon: Malissa Hippo, MD;  [facility];  Service: Endoscopy;  Laterality: N/A;  . Savory dilation N/A 11/17/2013    Procedure: SAVORY DILATION;  Surgeon: Malissa Hippo, MD;  [facility];  Service: Endoscopy;  Laterality: N/A;   Medications: Reviewed & Updated - see associated section                       Current outpatient prescriptions:  .  aspirin-acetaminophen-caffeine (EXCEDRIN MIGRAINE) 250-250-65 MG per tablet, Take 1 tablet by mouth every 6 (six) hours as needed for headache. , Disp: , Rfl:  .  butalbital-acetaminophen-caffeine (FIORICET) 50-325-40 MG per tablet, Take 1-2 tablets by mouth every 6 (six) hours as needed for headache or migraine., Disp: 40 tablet, Rfl: 0 .  Butalbital-APAP-Caffeine (CAPACET) 50-325-40 MG capsule, Take 1 capsule by mouth every 6 (six) hours as needed for pain  or headache., Disp: 90 capsule, Rfl: 2 °•  docusate sodium (COLACE) 100 MG capsule, Take 100 mg by mouth daily., Disp: , Rfl:  °•  estradiol (ESTRACE) 1 MG tablet, Take 1 tablet (1 mg total) by mouth daily., Disp: 30 tablet, Rfl: 11 °•  fluticasone (VERAMYST) 27.5 MCG/SPRAY nasal spray, Place 2 sprays into the nose daily., Disp: , Rfl:  °•  gabapentin (NEURONTIN) 300 MG capsule, Take 600 mg by mouth 3 (three) times daily., Disp: , Rfl:  °•  GINSENG PO, Take 1 tablet by mouth daily. , Disp: , Rfl:  °•  hydrocortisone (ANUSOL-HC) 2.5 % rectal cream, Place 1 application rectally 2 (two) times daily., Disp: 30 g, Rfl: 1 °•  medroxyPROGESTERone (PROVERA) 5 MG tablet, Take 1 tablet (5 mg total) by mouth daily. One tablet daily x 2 weeks. Repeat as directed every third month. (Patient not taking:  Reported on 11/22/2014), Disp: 14 tablet, Rfl: 3 °•  omeprazole (PRILOSEC) 40 MG capsule, Take 40 mg by mouth daily., Disp: , Rfl:  °•  ranitidine (ZANTAC) 150 MG tablet, Take 150 mg by mouth 2 (two) times daily., Disp: , Rfl:  °•  rosuvastatin (CRESTOR) 20 MG tablet, Take 20 mg by mouth daily., Disp: , Rfl:  °•  traMADol (ULTRAM) 50 MG tablet, Take 1 tablet (50 mg total) by mouth every 6 (six) hours as needed for moderate pain or severe pain., Disp: 30 tablet, Rfl: 0 °•  Vitamin D, Ergocalciferol, (DRISDOL) 50000 UNITS CAPS capsule, Take 50,000 Units by mouth every 7 (seven) days. Takes on Saturdays., Disp: , Rfl:  ° °Social History: Reviewed -  reports that she has been smoking Cigarettes.  She has a 2.5 pack-year smoking history. She has never used smokeless tobacco. ° °Objective Findings:  °Vitals: Blood pressure 118/70, height 5' 5" (1.651 m), weight 224 lb (101.606 kg). ° °Physical Examination: Office Visit for Discussion Only   °Assessment & Plan:  ° °A:  °1. 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  ° °P:  °1. Continue Butalb-Acetamin  °2. Rx for Fioricet  °3. Tramadol for severe headaches when Fioricet does not work.  °4. Continue estrogen  °5. F/U in three months  ° °By signing my name below, I, Nusrat Rahman, attest that this documentation has been prepared under the direction and in the presence of Suhey Radford, MD. °Electronically Signed: Nusrat Rahman, ED Scribe. 01/18/2015. 12:47 PM. ° ° °I personally performed the services described in this documentation, which was SCRIBED in my presence. The recorded information has been reviewed and considered accurate. It has been edited as necessary during review. °Jeanpierre Thebeau V, MD ° °  ° °

## 2015-02-04 ENCOUNTER — Other Ambulatory Visit: Payer: Self-pay | Admitting: Obstetrics and Gynecology

## 2015-02-04 MED ORDER — ESTRADIOL 1 MG PO TABS
1.0000 mg | ORAL_TABLET | Freq: Every day | ORAL | Status: DC
Start: 2015-02-04 — End: 2015-10-18

## 2015-02-04 MED ORDER — BUTALBITAL-APAP-CAFFEINE 50-325-40 MG PO CAPS: 1.0000 | ORAL_CAPSULE | Freq: Four times a day (QID) | ORAL | Status: DC | PRN

## 2015-02-04 MED ORDER — TRAMADOL HCL 50 MG PO TABS: 50.0000 mg | ORAL_TABLET | Freq: Four times a day (QID) | ORAL | Status: DC | PRN

## 2015-04-24 ENCOUNTER — Ambulatory Visit: Payer: Medicare HMO | Admitting: Obstetrics and Gynecology

## 2015-05-27 ENCOUNTER — Other Ambulatory Visit: Payer: Self-pay | Admitting: Obstetrics and Gynecology

## 2015-05-27 DIAGNOSIS — Z1231 Encounter for screening mammogram for malignant neoplasm of breast: Secondary | ICD-10-CM

## 2015-05-29 ENCOUNTER — Ambulatory Visit (INDEPENDENT_AMBULATORY_CARE_PROVIDER_SITE_OTHER): Payer: Medicare HMO | Admitting: Obstetrics and Gynecology

## 2015-05-29 ENCOUNTER — Encounter: Payer: Self-pay | Admitting: Obstetrics and Gynecology

## 2015-05-29 ENCOUNTER — Other Ambulatory Visit (HOSPITAL_COMMUNITY)
Admission: RE | Admit: 2015-05-29 | Discharge: 2015-05-29 | Disposition: A | Discharge status: Home / Self Care | Payer: Medicare HMO | Source: Ambulatory Visit | Attending: Obstetrics and Gynecology | Admitting: Obstetrics and Gynecology

## 2015-05-29 VITALS — BP 110/76 | Ht 66.0 in | Wt 219.5 lb

## 2015-05-29 DIAGNOSIS — Z1151 Encounter for screening for human papillomavirus (HPV): Secondary | ICD-10-CM | POA: Insufficient documentation

## 2015-05-29 DIAGNOSIS — Z01419 Encounter for gynecological examination (general) (routine) without abnormal findings: Secondary | ICD-10-CM | POA: Diagnosis present

## 2015-05-29 DIAGNOSIS — Z124 Encounter for screening for malignant neoplasm of cervix: Secondary | ICD-10-CM

## 2015-05-29 NOTE — Progress Notes (Signed)
Patient ID: Shelly Hanson, female   DOB: 28-Mar-1961, 55 y.o.   MRN: [id number] Pt here today for pap only.

## 2015-05-29 NOTE — Progress Notes (Signed)
Patient ID: Shelly Hanson, female   DOB: 06-14-1960, 55 y.o.   MRN: [id number]  Assessment:  Annual Gyn Exam 1. Normal GYN exam   Plan:  1. pap smear done, next pap due in 3 years.  2. return annually or prn 3    Annual mammogram advised Subjective:  Shelly Hanson is a 55 y.o. female G2P0020 who presents for annual exam. No LMP recorded. Patient is postmenopausal. The patient complains of intermittent episodes of frequent urge to urinate which is worsened with intercourse. Pt denies any other Sx at this time.   The date of pt's last pap smear is unknown.The patient reports she is also having a mammogram completed on Monday.   The following portions of the patient's history were reviewed and updated as appropriate: allergies, current medications, past family history, past medical history, past social history, past surgical history and problem list. Past Medical History  Diagnosis Date  . High cholesterol   . GERD (gastroesophageal reflux disease)   . Pinched nerve in neck   . Vitamin D deficiency disease   . Hyperlipemia   . Sciatic leg pain   . Pinched nerve     pinched nerve in back     Past Surgical History  Procedure Laterality Date  . Foot surgery      for a fx.   . Cholecystectomy    . Dilation and curettage of uterus      x 2   . Colonoscopy N/A 11/17/2013    Procedure: COLONOSCOPY;  Surgeon: Malissa Hippo, MD;  [facility];  Service: Endoscopy;  Laterality: N/A;  1030  . Esophagogastroduodenoscopy N/A 11/17/2013    Procedure: ESOPHAGOGASTRODUODENOSCOPY (EGD);  Surgeon: Malissa Hippo, MD;  [facility];  Service: Endoscopy;  Laterality: N/A;  . Balloon dilation N/A 11/17/2013    Procedure: BALLOON DILATION;  Surgeon: Malissa Hippo, MD;  [facility];  Service: Endoscopy;  Laterality: N/A;  [name] dilation N/A 11/17/2013    Procedure: [name] DILATION;  Surgeon: Malissa Hippo, MD;  [facility];  Service:  Endoscopy;  Laterality: N/A;  . Savory dilation N/A 11/17/2013    Procedure: SAVORY DILATION;  Surgeon: Malissa Hippo, MD;  [facility];  Service: Endoscopy;  Laterality: N/A;     Current outpatient prescriptions:  .  Butalbital-APAP-Caffeine (CAPACET) 50-325-40 MG capsule, Take 1 capsule by mouth every 6 (six) hours as needed for pain or headache., Disp: 90 capsule, Rfl: 2 .  docusate sodium (COLACE) 100 MG capsule, Take 100 mg by mouth daily., Disp: , Rfl:  .  estradiol (ESTRACE) 1 MG tablet, Take 1 tablet (1 mg total) by mouth daily., Disp: 30 tablet, Rfl: 11 .  fluticasone (VERAMYST) 27.5 MCG/SPRAY nasal spray, Place 2 sprays into the nose daily., Disp: , Rfl:  .  gabapentin (NEURONTIN) 300 MG capsule, Take 600 mg by mouth 3 (three) times daily., Disp: , Rfl:  .  GINSENG PO, Take 1 tablet by mouth daily. , Disp: , Rfl:  .  hydrocortisone (ANUSOL-HC) 2.5 % rectal cream, Place 1 application rectally 2 (two) times daily., Disp: 30 g, Rfl: 1 .  medroxyPROGESTERone (PROVERA) 5 MG tablet, Take 1 tablet (5 mg total) by mouth daily. One tablet daily x 2 weeks. Repeat as directed every third month., Disp: 14 tablet, Rfl: 3 .  omeprazole (PRILOSEC) 40 MG capsule, Take 40 mg by mouth daily., Disp: , Rfl:  .  ranitidine (ZANTAC)  150 MG tablet, Take 150 mg by mouth 2 (two) times daily., Disp: , Rfl:  .  rosuvastatin (CRESTOR) 20 MG tablet, Take 20 mg by mouth daily., Disp: , Rfl:  .  traMADol (ULTRAM) 50 MG tablet, Take 1 tablet (50 mg total) by mouth every 6 (six) hours as needed for moderate pain or severe pain., Disp: 30 tablet, Rfl: 0 .  Vitamin D, Ergocalciferol, (DRISDOL) 50000 UNITS CAPS capsule, Take 50,000 Units by mouth every 7 (seven) days. Takes on Saturdays., Disp: , Rfl:  .  aspirin-acetaminophen-caffeine (EXCEDRIN MIGRAINE) 250-250-65 MG per tablet, Take 1 tablet by mouth every 6 (six) hours as needed for headache. , Disp: , Rfl:  .  meloxicam (MOBIC) 15 MG tablet, , Disp: ,  Rfl:   Review of Systems Constitutional: negative  Gastrointestinal: negative Genitourinary: frequent urge to urinate   Objective:  BP 110/76 mmHg  Ht  (1.676 m)  Wt 219 lb 8 oz (99.565 kg)  BMI 35.45 kg/m2   BMI: Body mass index is 35.45 kg/(m^2).  General Appearance: Alert, appropriate appearance for age. No acute distress HEENT: Grossly normal Neck / Thyroid:  Cardiovascular: RRR; normal S1, S2, no murmur Lungs: CTA bilaterally Back: No CVAT Gastrointestinal: Soft, non-tender, no masses or organomegaly Pelvic Exam: Vulva and vagina appear normal. Bimanual exam reveals normal uterus and adnexa. External genitalia: normal general appearance Urinary system: urethral meatus normal Vaginal: normal mucosa without prolapse or lesions, normal without tenderness, induration or masses and normal rugae Cervix: normal appearance Adnexa: normal bimanual exam Uterus: normal single, nontender, mid-position and tiny and well supported Rectal: good sphincter tone, no masses and guaiac negative Rectovaginal: normal rectal, no masses Lymphatic Exam: Non-palpable nodes in neck, clavicular, axillary, or inguinal regions  Skin: no rash or abnormalities Neurologic: Normal gait and speech, no tremor  Psychiatric: Alert and oriented, appropriate affect.  Urinalysis:Not done  Christin Bach. MD Pgr [phone] 2:29 PM    By signing my name below, I, Marica Otter, attest that this documentation has been prepared under the direction and in the presence of Christin Bach, MD. Electronically Signed: Marica Otter, ED Scribe. 05/29/2015. 2:29 PM.   I personally performed the services described in this documentation, which was SCRIBED in my presence. The recorded information has been reviewed and considered accurate. It has been edited as necessary during review. Tilda Burrow, MD

## 2015-06-03 ENCOUNTER — Telehealth: Payer: Self-pay | Admitting: *Deleted

## 2015-06-03 ENCOUNTER — Ambulatory Visit (HOSPITAL_COMMUNITY)
Admission: RE | Admit: 2015-06-03 | Discharge: 2015-06-03 | Disposition: A | Discharge status: Home / Self Care | Payer: Medicare HMO | Source: Ambulatory Visit | Attending: Obstetrics and Gynecology | Admitting: Obstetrics and Gynecology

## 2015-06-03 DIAGNOSIS — Z1231 Encounter for screening mammogram for malignant neoplasm of breast: Secondary | ICD-10-CM | POA: Insufficient documentation

## 2015-06-03 LAB — CYTOLOGY - PAP

## 2015-06-03 NOTE — Telephone Encounter (Signed)
Pt aware of her results 

## 2015-06-10 ENCOUNTER — Telehealth: Payer: Self-pay | Admitting: Obstetrics and Gynecology

## 2015-06-11 NOTE — Telephone Encounter (Signed)
Pt informed of normal mammogram results

## 2015-07-12 ENCOUNTER — Emergency Department (HOSPITAL_COMMUNITY): Payer: Medicare HMO

## 2015-07-12 ENCOUNTER — Encounter (HOSPITAL_COMMUNITY): Payer: Self-pay | Admitting: Emergency Medicine

## 2015-07-12 ENCOUNTER — Emergency Department (HOSPITAL_COMMUNITY)
Admission: EM | Admit: 2015-07-12 | Discharge: 2015-07-12 | Disposition: A | Discharge status: Home / Self Care | Payer: Medicare HMO | Attending: Emergency Medicine | Admitting: Emergency Medicine

## 2015-07-12 DIAGNOSIS — R197 Diarrhea, unspecified: Secondary | ICD-10-CM | POA: Insufficient documentation

## 2015-07-12 DIAGNOSIS — F17210 Nicotine dependence, cigarettes, uncomplicated: Secondary | ICD-10-CM | POA: Insufficient documentation

## 2015-07-12 DIAGNOSIS — K2970 Gastritis, unspecified, without bleeding: Secondary | ICD-10-CM | POA: Insufficient documentation

## 2015-07-12 DIAGNOSIS — Z79899 Other long term (current) drug therapy: Secondary | ICD-10-CM | POA: Diagnosis not present

## 2015-07-12 DIAGNOSIS — R1013 Epigastric pain: Secondary | ICD-10-CM | POA: Diagnosis present

## 2015-07-12 DIAGNOSIS — E785 Hyperlipidemia, unspecified: Secondary | ICD-10-CM | POA: Diagnosis not present

## 2015-07-12 LAB — COMPREHENSIVE METABOLIC PANEL WITH GFR
ALT: 21 U/L (ref 14–54)
AST: 26 U/L (ref 15–41)
Albumin: 4.4 g/dL (ref 3.5–5.0)
Alkaline Phosphatase: 83 U/L (ref 38–126)
Anion gap: 9 (ref 5–15)
BUN: 24 mg/dL — ABNORMAL HIGH (ref 6–20)
CO2: 26 mmol/L (ref 22–32)
Calcium: 9.1 mg/dL (ref 8.9–10.3)
Chloride: 104 mmol/L (ref 101–111)
Creatinine, Ser: 0.68 mg/dL (ref 0.44–1.00)
GFR calc Af Amer: >60 mL/min
GFR calc non Af Amer: >60 mL/min
Glucose, Bld: 109 mg/dL — ABNORMAL HIGH (ref 65–99)
Potassium: 4 mmol/L (ref 3.5–5.1)
Sodium: 139 mmol/L (ref 135–145)
Total Bilirubin: 0.8 mg/dL (ref 0.3–1.2)
Total Protein: 7.4 g/dL (ref 6.5–8.1)

## 2015-07-12 LAB — CBC WITH DIFFERENTIAL/PLATELET
Basophils Absolute: 0 10*3/uL (ref 0.0–0.1)
Basophils Relative: 1 %
Eosinophils Absolute: 0.1 10*3/uL (ref 0.0–0.7)
Eosinophils Relative: 2 %
HCT: 45.4 % (ref 36.0–46.0)
Hemoglobin: 15.5 g/dL — ABNORMAL HIGH (ref 12.0–15.0)
Lymphocytes Relative: 27 %
Lymphs Abs: 2 10*3/uL (ref 0.7–4.0)
MCH: 31.6 pg (ref 26.0–34.0)
MCHC: 34.1 g/dL (ref 30.0–36.0)
MCV: 92.5 fL (ref 78.0–100.0)
Monocytes Absolute: 0.5 10*3/uL (ref 0.1–1.0)
Monocytes Relative: 7 %
Neutro Abs: 4.9 10*3/uL (ref 1.7–7.7)
Neutrophils Relative %: 63 %
Platelets: 193 10*3/uL (ref 150–400)
RBC: 4.91 MIL/uL (ref 3.87–5.11)
RDW: 13 % (ref 11.5–15.5)
WBC: 7.6 10*3/uL (ref 4.0–10.5)

## 2015-07-12 LAB — URINALYSIS, ROUTINE W REFLEX MICROSCOPIC
BILIRUBIN URINE: NEGATIVE
Glucose, UA: NEGATIVE mg/dL
HGB URINE DIPSTICK: NEGATIVE
Ketones, ur: 15 mg/dL — AB
Leukocytes, UA: NEGATIVE
NITRITE: NEGATIVE
Protein, ur: NEGATIVE mg/dL
SPECIFIC GRAVITY, URINE: 1.01 (ref 1.005–1.030)
pH: 8.5 — ABNORMAL HIGH (ref 5.0–8.0)

## 2015-07-12 LAB — LIPASE, BLOOD: LIPASE: 20 U/L (ref 11–51)

## 2015-07-12 MED ORDER — IOHEXOL 300 MG/ML  SOLN
100.0000 mL | Freq: Once | INTRAMUSCULAR | Status: AC | PRN
  Administered 2015-07-12: 100 mL via INTRAVENOUS

## 2015-07-12 MED ORDER — IOHEXOL 300 MG/ML  SOLN
25.0000 mL | Freq: Once | INTRAMUSCULAR | Status: AC | PRN
  Administered 2015-07-12: 25 mL via ORAL

## 2015-07-12 MED ORDER — SODIUM CHLORIDE 0.9 % IV BOLUS (SEPSIS)
1000.0000 mL | Freq: Once | INTRAVENOUS | Status: AC
  Administered 2015-07-12: 1000 mL via INTRAVENOUS

## 2015-07-12 MED ORDER — PANTOPRAZOLE SODIUM 40 MG IV SOLR
40.0000 mg | Freq: Once | INTRAVENOUS | Status: AC
  Administered 2015-07-12: 40 mg via INTRAVENOUS
  Filled 2015-07-12: qty 40

## 2015-07-12 MED ORDER — ONDANSETRON HCL 4 MG/2ML IJ SOLN
4.0000 mg | Freq: Once | INTRAMUSCULAR | Status: AC
  Administered 2015-07-12: 4 mg via INTRAVENOUS
  Filled 2015-07-12: qty 2

## 2015-07-12 MED ORDER — HYDROMORPHONE HCL 1 MG/ML IJ SOLN
1.0000 mg | Freq: Once | INTRAMUSCULAR | Status: AC
  Administered 2015-07-12: 1 mg via INTRAVENOUS
  Filled 2015-07-12: qty 1

## 2015-07-12 NOTE — Discharge Instructions (Signed)
Take your Ultram for pain for your belly as needed. Increase her Prilosec to twice a day. Follow-up with her family doctor next week

## 2015-07-12 NOTE — ED Provider Notes (Addendum)
CSN: [id number]     Arrival date & time 07/12/15  1102 History  By signing my name below, I, Shelly Hanson, attest that this documentation has been prepared under the direction and in the presence of Shelly Berkshire, MD. Electronically Signed: [name], ED Scribe. 07/12/2015. 12:12 PM.   Chief Complaint  Patient presents with  . Abdominal Pain   Patient is a 55 y.o. female presenting with abdominal pain. The history is provided by the patient. No language interpreter was used.  Abdominal Pain Pain location:  Epigastric and LUQ Pain quality: aching and sharp   Pain severity:  Severe Timing:  Intermittent Progression:  Worsening Chronicity:  Chronic Associated symptoms: diarrhea   Associated symptoms: no chest pain, no cough, no fatigue and no hematuria    PCP: [facility] HPI Comments: Shelly Hanson is a 55 y.o. female, with  Hx of hiatal hernia, who presents to the Emergency Department complaining of abd pain. Associated Sx include diarrhea and urinary frequency onset three days ago.  Past Medical History  Diagnosis Date  . High cholesterol   . GERD (gastroesophageal reflux disease)   . Pinched nerve in neck   . Vitamin D deficiency disease   . Hyperlipemia   . Sciatic leg pain   . Pinched nerve     pinched nerve in back    Past Surgical History  Procedure Laterality Date  . Foot surgery      for a fx.   . Cholecystectomy    . Dilation and curettage of uterus      x 2   . Colonoscopy N/A 11/17/2013    Procedure: COLONOSCOPY;  Surgeon: Shelly Hippo, MD;  [facility];  Service: Endoscopy;  Laterality: N/A;  1030  . Esophagogastroduodenoscopy N/A 11/17/2013    Procedure: ESOPHAGOGASTRODUODENOSCOPY (EGD);  Surgeon: Shelly Hippo, MD;  [facility];  Service: Endoscopy;  Laterality: N/A;  . Balloon dilation N/A 11/17/2013    Procedure: BALLOON DILATION;  Surgeon: Shelly Hippo, MD;  [facility];  Service:  Endoscopy;  Laterality: N/A;  Shelly Hanson dilation N/A 11/17/2013    Procedure: Shelly Hanson DILATION;  Surgeon: Shelly Hippo, MD;  [facility];  Service: Endoscopy;  Laterality: N/A;  . Savory dilation N/A 11/17/2013    Procedure: SAVORY DILATION;  Surgeon: Shelly Hippo, MD;  [facility];  Service: Endoscopy;  Laterality: N/A;   Family History  Problem Relation Age of Onset  . Colon cancer Father   . Hypertension Father   . Seizures Father   . Cancer Father   . Depression Mother   . Diabetes Mother   . Hyperlipidemia Mother   . Hyperlipidemia Brother   . Heart disease Brother   . Hyperlipidemia Brother    Social History  Substance Use Topics  . Smoking status: Current Every Day Smoker -- 0.25 packs/day for 10 years    Types: Cigarettes  . Smokeless tobacco: Never Used     Comment: 1 pack every 3 days greater than 10 yrs  . Alcohol Use: No   OB History    Gravida Para Term Preterm AB TAB SAB Ectopic Multiple Living   Review of Systems  Constitutional: Negative for appetite change and fatigue.  HENT: Negative for congestion, ear discharge and sinus pressure.   Eyes: Negative for discharge.  Respiratory: Negative for  cough.   Cardiovascular: Negative for chest pain.  Gastrointestinal: Positive for abdominal pain and diarrhea.  Genitourinary: Positive for frequency. Negative for hematuria.  Musculoskeletal: Negative for back pain.  Skin: Negative for rash.  Neurological: Negative for seizures and headaches.  Psychiatric/Behavioral: Negative for hallucinations.   Allergies  Codeine; Flagyl; Naproxen; and Sulfa antibiotics  Home Medications   Prior to Admission medications   Medication Sig Start Date End Date Taking? Authorizing Provider  aspirin-acetaminophen-caffeine (EXCEDRIN MIGRAINE) [id number] MG per tablet Take 1 tablet by mouth every 6 (six) hours as needed for headache.     Historical Provider, MD  Butalbital-APAP-Caffeine  (CAPACET) 50-325-40 MG capsule Take 1 capsule by mouth every 6 (six) hours as needed for pain or headache. 02/04/15   Shelly BurrowJohn Hanson V, MD  docusate sodium (COLACE) 100 MG capsule Take 100 mg by mouth daily.    Historical Provider, MD  estradiol (ESTRACE) 1 MG tablet Take 1 tablet (1 mg total) by mouth daily. 02/04/15 02/04/16  Shelly BurrowJohn Hanson V, MD  fluticasone (VERAMYST) 27.5 MCG/SPRAY nasal spray Place 2 sprays into the nose daily.    Historical Provider, MD  gabapentin (NEURONTIN) 300 MG capsule Take 600 mg by mouth 3 (three) times daily.    Historical Provider, MD  GINSENG PO Take 1 tablet by mouth daily.     Historical Provider, MD  hydrocortisone (ANUSOL-HC) 2.5 % rectal cream Place 1 application rectally 2 (two) times daily. 10/25/14   Shelly Blalockerri L Setzer, NP  medroxyPROGESTERone (PROVERA) 5 MG tablet Take 1 tablet (5 mg total) by mouth daily. One tablet daily x 2 weeks. Repeat as directed every third month. 11/12/14   Shelly BurrowJohn Hanson V, MD  meloxicam [facility](MOBIC) 15 MG tablet  05/16/15   Historical Provider, MD  omeprazole (PRILOSEC) 40 MG capsule Take 40 mg by mouth daily.    Historical Provider, MD  ranitidine (ZANTAC) 150 MG tablet Take 150 mg by mouth 2 (two) times daily.    Historical Provider, MD  rosuvastatin (CRESTOR) 20 MG tablet Take 20 mg by mouth daily.    Historical Provider, MD  traMADol (ULTRAM) 50 MG tablet Take 1 tablet (50 mg total) by mouth every 6 (six) hours as needed for moderate pain or severe pain. 02/04/15   Shelly BurrowJohn Hanson V, MD  Vitamin D, Ergocalciferol, (DRISDOL) 50000 UNITS CAPS capsule Take 50,000 Units by mouth every 7 (seven) days. Takes on Saturdays.    Historical Provider, MD   Triage Vitals: BP 112/64 mmHg  Pulse 60  Temp(Src) 97.7 F (36.5 C) (Oral)  Resp 18  Ht 5\' 5"  (1.651 m)  Wt 217 lb (98.431 kg)  BMI 36.11 kg/m2  SpO2 100% Physical Exam  Constitutional: She is oriented to person, place, and time. She appears well-developed.  HENT:  Head: Normocephalic.  Eyes:  Conjunctivae and EOM are normal. No scleral icterus.  Neck: Neck supple. No thyromegaly present.  Cardiovascular: Normal rate and regular rhythm.  Exam reveals no gallop and no friction rub.   No murmur heard. Pulmonary/Chest: No stridor. She has no wheezes. She has no rales. She exhibits no tenderness.  Abdominal: She exhibits no distension. There is tenderness in the epigastric area. There is no rebound.  Musculoskeletal: Normal range of motion. She exhibits no edema.  Lymphadenopathy:    She has no cervical adenopathy.  Neurological: She is oriented to person, place, and time. She exhibits normal muscle tone. Coordination normal.  Skin: No rash noted. No erythema.  Psychiatric: She has a normal mood and  affect. Her behavior is normal.    ED Course  Procedures (including critical care time) DIAGNOSTIC STUDIES: Oxygen Saturation is 100% on ra, nl by my interpretation.    COORDINATION OF CARE: 12:11 PM: Discussed treatment plan which includes labs with pt at bedside; patient verbalizes understanding and agrees with treatment plan.  Labs Review Labs Reviewed  CBC WITH DIFFERENTIAL/PLATELET - Abnormal; Notable for the following:    Hemoglobin 15.5 (*)    All other components within normal limits  COMPREHENSIVE METABOLIC PANEL - Abnormal; Notable for the following:    Glucose, Bld 109 (*)    BUN 24 (*)    All other components within normal limits  URINALYSIS, ROUTINE W REFLEX MICROSCOPIC (NOT AT [facility]) - Abnormal; Notable for the following:    pH 8.5 (*)    Ketones, ur 15 (*)    All other components within normal limits  LIPASE, BLOOD  I have personally reviewed and evaluated these lab results as part of my medical decision-making.  MDM   Final diagnoses:  None    Patient with abdominal pain. She has a history of a hiatal hernia. CT scan unremarkable labs unremarkable. Will increase Prilosec to twice a day have her take her Ultram if needed occasionally for abdominal pain and  follow-up with her PCP next week  The chart was scribed for me under my direct supervision.  I personally performed the history, physical, and medical decision making and all procedures in the evaluation of this patient.Shelly Berkshire, MD 07/12/15 1410  Shelly Berkshire, MD 07/12/15 [phone]

## 2015-07-12 NOTE — ED Notes (Signed)
PT c/o upper abdominal pain and states "I think I have a hernia." PT also c/o nausea and and some diarrhea x3 days as well, and also increased urinary frequency.

## 2015-07-15 ENCOUNTER — Telehealth: Payer: Self-pay | Admitting: Obstetrics and Gynecology

## 2015-07-15 NOTE — Telephone Encounter (Signed)
Pt states that she had to go to the ED the other night. Pt states that she is having a lot of hot flashes. Pt wants to know if there is a medication that she can be given other than the estradiol 1mg . Pt states that when she takes the provera sex is not as painful, when she is not on the provera she has more pain with sex. Pt would like to know Dr. Rayna SextonFerguson's opinion of what she should take and do.

## 2015-07-15 NOTE — Telephone Encounter (Signed)
This is an appropriate appt issue

## 2015-07-17 NOTE — Telephone Encounter (Signed)
LMOM for pt to call back and make an appointment.

## 2015-07-22 ENCOUNTER — Encounter: Payer: Self-pay | Admitting: Obstetrics and Gynecology

## 2015-07-22 ENCOUNTER — Ambulatory Visit (INDEPENDENT_AMBULATORY_CARE_PROVIDER_SITE_OTHER): Payer: Medicare HMO | Admitting: Obstetrics and Gynecology

## 2015-07-22 VITALS — BP 100/60 | Ht 65.0 in | Wt 213.0 lb

## 2015-07-22 DIAGNOSIS — N951 Menopausal and female climacteric states: Secondary | ICD-10-CM

## 2015-07-22 MED ORDER — VENLAFAXINE HCL ER 37.5 MG PO CP24: 75.0000 mg | ORAL_CAPSULE | Freq: Every day | ORAL | Status: DC

## 2015-07-22 NOTE — Progress Notes (Signed)
Patient ID: Shelly Hanson, female   DOB: September 22, 1960, 55 y.o.   MRN: [id number]   [facility] Visit  Patient name: Shelly Hanson MRN [id number]  Date of birth: 07/23/1960  CC & HPI:  [name] is a 55 y.o. female presenting today for chronic, worsening hot flashes onset one year ago. Pt reports taking daily estradiol  and provera for 2 weeks every three months. Pt reports the meds are not helping her hot flashes. Pt denies any periods. Pt denies any sleep disturbances.    ROS:  Review of Systems  Constitutional:       Positive for hot flashes   All other systems reviewed and are negative.  Pertinent History Reviewed:   Reviewed: Significant for  Medical         Past Medical History  Diagnosis Date  . High cholesterol   . GERD (gastroesophageal reflux disease)   . Pinched nerve in neck   . Vitamin D deficiency disease   . Hyperlipemia   . Sciatic leg pain   . Pinched nerve     pinched nerve in back                               Surgical Hx:    Past Surgical History  Procedure Laterality Date  . Foot surgery      for a fx.   . Cholecystectomy    . Dilation and curettage of uterus      x 2   . Colonoscopy N/A 11/17/2013    Procedure: COLONOSCOPY;  Surgeon: Malissa Hippo, MD;  [facility];  Service: Endoscopy;  Laterality: N/A;  1030  . Esophagogastroduodenoscopy N/A 11/17/2013    Procedure: ESOPHAGOGASTRODUODENOSCOPY (EGD);  Surgeon: Malissa Hippo, MD;  [facility];  Service: Endoscopy;  Laterality: N/A;  . Balloon dilation N/A 11/17/2013    Procedure: BALLOON DILATION;  Surgeon: Malissa Hippo, MD;  [facility];  Service: Endoscopy;  Laterality: N/A;  [name] dilation N/A 11/17/2013    Procedure: [name] DILATION;  Surgeon: Malissa Hippo, MD;  [facility];  Service: Endoscopy;  Laterality: N/A;  . Savory dilation N/A 11/17/2013    Procedure: SAVORY DILATION;  Surgeon: Malissa Hippo, MD;   [facility];  Service: Endoscopy;  Laterality: N/A;   Medications: Reviewed & Updated - see associated section                       Current outpatient prescriptions:  .  Butalbital-APAP-Caffeine (CAPACET) 50-325-40 MG capsule, Take 1 capsule by mouth every 6 (six) hours as needed for pain or headache., Disp: 90 capsule, Rfl: 2 .  docusate sodium (COLACE) 100 MG capsule, Take 100 mg by mouth daily., Disp: , Rfl:  .  estradiol (ESTRACE) 1 MG tablet, Take 1 tablet (1 mg total) by mouth daily., Disp: 30 tablet, Rfl: 11 .  fluticasone (VERAMYST) 27.5 MCG/SPRAY nasal spray, Place 2 sprays into the nose daily., Disp: , Rfl:  .  gabapentin (NEURONTIN) 300 MG capsule, Take 600 mg by mouth 3 (three) times daily., Disp: , Rfl:  .  medroxyPROGESTERone (PROVERA) 5 MG tablet, Take 1 tablet (5 mg total) by mouth daily. One tablet daily x 2 weeks. Repeat as directed every third month., Disp: 14 tablet, Rfl: 3 .  meloxicam (MOBIC) 15 MG tablet, , Disp: , Rfl:  .  omeprazole (PRILOSEC) 40 MG capsule, Take 40 mg by mouth daily., Disp: , Rfl:  .  ranitidine (ZANTAC) 150 MG tablet, Take 150 mg by mouth 2 (two) times daily., Disp: , Rfl:  .  rosuvastatin (CRESTOR) 20 MG tablet, Take 20 mg by mouth daily., Disp: , Rfl:  .  traMADol (ULTRAM) 50 MG tablet, Take 1 tablet (50 mg total) by mouth every 6 (six) hours as needed for moderate pain or severe pain., Disp: 30 tablet, Rfl: 0 .  Vitamin D, Ergocalciferol, (DRISDOL) 50000 UNITS CAPS capsule, Take 50,000 Units by mouth every 7 (seven) days. Takes on Saturdays., Disp: , Rfl:  .  hydrocortisone (ANUSOL-HC) 2.5 % rectal cream, Place 1 application rectally 2 (two) times daily. (Patient not taking: Reported on 07/22/2015), Disp: 30 g, Rfl: 1   Social History: Reviewed -  reports that she has been smoking Cigarettes.  She has a 2.5 pack-year smoking history. She has never used smokeless tobacco.  Objective Findings:  Vitals: Blood pressure 100/60, height 5\' 5"   (1.651 m), weight 213 lb (96.616 kg).  Physical Examination: Presents for discussion only.  Discussed with pt medicine changes to address worsening hot flashes. At end of discussion, pt had opportunity to ask questions and has no further questions at this time.  Greater than 50% was spent in counseling and coordination of care with the patient. Total time greater than: 10 minutes   Assessment & Plan:   A:  1. 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   P:  1. Rx for effexor.37.5 mg. 2. Continue daily estradiol 1 mg  3. Continue provera 5 mg /d x 2weeks every 3 months.      By signing my name below, I, Shelly Hanson, attest that this documentation has been prepared under the direction and in the presence of Christin BachJohn Malkie Wille, MD. Electronically Signed: Marica OtterNusrat Hanson, ED Scribe. 07/22/2015. 2:59 PM.   I personally performed the services described in this documentation, which was SCRIBED in my presence. The recorded information has been reviewed and considered accurate. It has been edited as necessary during review. Tilda BurrowFERGUSON,Liahm Grivas V, MD

## 2015-08-16 ENCOUNTER — Encounter (INDEPENDENT_AMBULATORY_CARE_PROVIDER_SITE_OTHER): Payer: Self-pay | Admitting: *Deleted

## 2015-09-20 ENCOUNTER — Other Ambulatory Visit: Payer: Self-pay | Admitting: Obstetrics and Gynecology

## 2015-10-07 ENCOUNTER — Telehealth (INDEPENDENT_AMBULATORY_CARE_PROVIDER_SITE_OTHER): Payer: Self-pay | Admitting: Internal Medicine

## 2015-10-07 ENCOUNTER — Other Ambulatory Visit (INDEPENDENT_AMBULATORY_CARE_PROVIDER_SITE_OTHER): Payer: Self-pay | Admitting: *Deleted

## 2015-10-07 DIAGNOSIS — R110 Nausea: Secondary | ICD-10-CM

## 2015-10-07 MED ORDER — ONDANSETRON HCL 4 MG PO TABS: 4.0000 mg | ORAL_TABLET | Freq: Two times a day (BID) | ORAL | Status: DC

## 2015-10-07 NOTE — Telephone Encounter (Signed)
I have tried calling the patient at the number provided, the line is continuously busy. Rx was sent to the local Pharmacy on the patients's chart.

## 2015-10-07 NOTE — Telephone Encounter (Signed)
Per Dr.Rehman - May call in Ondansetron 4 mg  - Take 1 by mouth Twice daily prn Nausea and vomiting. #20 no refills . Patient was called and made aware. This was sent to the patient's pharmacy.

## 2015-10-07 NOTE — Telephone Encounter (Signed)
Dr.Rehman gave an order to call in the Ondansetron to pharmacy. This was sent by [facility].

## 2015-10-07 NOTE — Telephone Encounter (Signed)
Patient called, has an appointment 11/01/15.  Needing something for nausea and would like to talk to someone.  [phone]

## 2015-10-10 ENCOUNTER — Encounter: Payer: Self-pay | Admitting: Obstetrics and Gynecology

## 2015-10-10 ENCOUNTER — Ambulatory Visit (INDEPENDENT_AMBULATORY_CARE_PROVIDER_SITE_OTHER): Payer: Medicare HMO | Admitting: Obstetrics and Gynecology

## 2015-10-10 VITALS — BP 120/80 | Ht 65.0 in

## 2015-10-10 DIAGNOSIS — G8929 Other chronic pain: Secondary | ICD-10-CM | POA: Diagnosis not present

## 2015-10-10 DIAGNOSIS — N951 Menopausal and female climacteric states: Secondary | ICD-10-CM | POA: Diagnosis not present

## 2015-10-10 DIAGNOSIS — R1031 Right lower quadrant pain: Secondary | ICD-10-CM | POA: Diagnosis not present

## 2015-10-10 NOTE — Progress Notes (Signed)
Patient ID: Shelly GuessDebra Holley Laabs, female   DOB: 05/23/1960, 55 y.o.   MRN: [id number]   [facility]  @DATE @            Patient name: Shelly Hanson MRN [id number]  Date of birth: 11/19/1960  CC & HPI:  Shelly Hanson is a 55 y.o. female presenting today for heavy PMB onset 3 days ago with associated RLQ throbbing pain. Pt was seen in the office on 07/22/15 for evaluation of vasomotor menopausal symptoms, unrelieved on provera. She was started on 37.5 mg effexor at this time, instructed to continue qd 1 mg estradiol and qd 5 mg provera x 2 weeks q3 months. Pt states that she tried 2 doses of effexor and discontinued the medication because it caused nausea. Pt states she has been taking provera and estradiol at night and finished taking the most recent course of provera yesterday.   Pt states her throbbing RLQ pain has been ongoing and intermittent since age 55 and has worsened in the past 2 weeks to be severe. She also reports many life stressors contributing to her overall well being, including her mother's recent decline in health. She and siblings collaborate in care for mom, who's now in [place] [facility]    ROS:  Review of Systems  Gastrointestinal: Positive for abdominal pain (RLQ).  Genitourinary:       +PMB  All other systems reviewed and are negative.    Pertinent History Reviewed:   Reviewed: Significant for D&C Medical         Past Medical History  Diagnosis Date  . High cholesterol   . GERD (gastroesophageal reflux disease)   . Pinched nerve in neck   . Vitamin D deficiency disease   . Hyperlipemia   . Sciatic leg pain   . Pinched nerve     pinched nerve in back                               Surgical Hx:    Past Surgical History  Procedure Laterality Date  . Foot surgery      for a fx.   . Cholecystectomy    . Dilation and curettage of uterus      x 2   . Colonoscopy N/A 11/17/2013    Procedure: COLONOSCOPY;  Surgeon: Malissa HippoNajeeb U Rehman, MD;   [facility];  Service: Endoscopy;  Laterality: N/A;  1030  . Esophagogastroduodenoscopy N/A 11/17/2013    Procedure: ESOPHAGOGASTRODUODENOSCOPY (EGD);  Surgeon: Malissa HippoNajeeb U Rehman, MD;  [facility];  Service: Endoscopy;  Laterality: N/A;  . Balloon dilation N/A 11/17/2013    Procedure: BALLOON DILATION;  Surgeon: Malissa HippoNajeeb U Rehman, MD;  [facility];  Service: Endoscopy;  Laterality: N/A;  [name]. [name] N/A 11/17/2013    Procedure: Elease HashimotoMALONEY DILATION;  Surgeon: Malissa HippoNajeeb U Rehman, MD;  [facility];  Service: Endoscopy;  Laterality: N/A;  . Savory dilation N/A 11/17/2013    Procedure: SAVORY DILATION;  Surgeon: Malissa HippoNajeeb U Rehman, MD;  [facility];  Service: Endoscopy;  Laterality: N/A;   Medications: Reviewed & Updated - see associated section                       Current outpatient prescriptions:  .  Butalbital-APAP-Caffeine (CAPACET) 50-325-40 MG capsule, Take 1 capsule by mouth every 6 (six) hours as needed for pain or  headache., Disp: 90 capsule, Rfl: 2 .  docusate sodium (COLACE) 100 MG capsule, Take 100 mg by mouth daily., Disp: , Rfl:  .  estradiol (ESTRACE) 1 MG tablet, Take 1 tablet (1 mg total) by mouth daily., Disp: 30 tablet, Rfl: 11 .  fluticasone (VERAMYST) 27.5 MCG/SPRAY nasal spray, Place 2 sprays into the nose daily., Disp: , Rfl:  .  gabapentin (NEURONTIN) 300 MG capsule, Take 600 mg by mouth 3 (three) times daily., Disp: , Rfl:  .  medroxyPROGESTERone (PROVERA) 5 MG tablet, TAKE 1 TABLET DAILY FOR 2 WEEKS. REPEAT AS DIRECTED EVERY THIRD MONTH., Disp: 14 tablet, Rfl: 3 .  meloxicam (MOBIC) 15 MG tablet, , Disp: , Rfl:  .  omeprazole (PRILOSEC) 40 MG capsule, Take 40 mg by mouth daily., Disp: , Rfl:  .  ranitidine (ZANTAC) 150 MG tablet, Take 150 mg by mouth 2 (two) times daily., Disp: , Rfl:  .  rosuvastatin (CRESTOR) 20 MG tablet, Take 20 mg by mouth daily., Disp: , Rfl:  .  traMADol (ULTRAM) 50 MG tablet, Take 1 tablet (50 mg  total) by mouth every 6 (six) hours as needed for moderate pain or severe pain., Disp: 30 tablet, Rfl: 0 .  venlafaxine XR (EFFEXOR-XR) 37.5 MG 24 hr capsule, Take 2 capsules (75 mg total) by mouth daily., Disp: 30 capsule, Rfl: 11 .  Vitamin D, Ergocalciferol, (DRISDOL) 50000 UNITS CAPS capsule, Take 50,000 Units by mouth every 7 (seven) days. Takes on Saturdays., Disp: , Rfl:    Social History: Reviewed -  reports that she has been smoking Cigarettes.  She has a 2.5 pack-year smoking history. She has never used smokeless tobacco.  Objective Findings:  Vitals: Blood pressure 120/80, height 5\' 5"  (1.651 m).  Physical Examination: discussion only   Discussed with pt hormone use and ongoing RLQ pain. At end of discussion, pt had opportunity to ask questions and has no further questions at this time.   Greater than 50% was spent in counseling and coordination of care with the patient. Total time greater than: 15 minutes    Assessment & Plan:   A:  1. Heavy PMB for 3 days  2. Ongoing RLQ pain for years, significantly worsened in the past 2 weeks   P:  1. F/u for US in 1 week for further evaluation of RLQ pain  2. Continue estradiol and  Restart effexor at night     By signing my name below, I, Doreatha MartinEva Mathews, attest that this documentation has been prepared under the direction and in the presence of Tilda BurrowJohn V Gaylene Moylan, MD. Electronically Signed: Doreatha MartinEva Mathews, ED Scribe. 10/10/2015. 3:58 PM.  I personally performed the services described in this documentation, which was SCRIBED in my presence. The recorded information has been reviewed and considered accurate. It has been edited as necessary during review. Tilda BurrowFERGUSON,Tevis Conger V, MD

## 2015-10-16 ENCOUNTER — Other Ambulatory Visit: Payer: Self-pay | Admitting: Obstetrics and Gynecology

## 2015-10-16 DIAGNOSIS — R1031 Right lower quadrant pain: Secondary | ICD-10-CM

## 2015-10-16 DIAGNOSIS — N950 Postmenopausal bleeding: Secondary | ICD-10-CM

## 2015-10-18 ENCOUNTER — Ambulatory Visit (INDEPENDENT_AMBULATORY_CARE_PROVIDER_SITE_OTHER): Payer: Medicare HMO

## 2015-10-18 ENCOUNTER — Encounter: Payer: Self-pay | Admitting: Obstetrics and Gynecology

## 2015-10-18 ENCOUNTER — Ambulatory Visit (INDEPENDENT_AMBULATORY_CARE_PROVIDER_SITE_OTHER): Payer: Medicare HMO | Admitting: Obstetrics and Gynecology

## 2015-10-18 VITALS — BP 122/80 | Wt 214.0 lb

## 2015-10-18 DIAGNOSIS — N938 Other specified abnormal uterine and vaginal bleeding: Secondary | ICD-10-CM

## 2015-10-18 DIAGNOSIS — N950 Postmenopausal bleeding: Secondary | ICD-10-CM

## 2015-10-18 DIAGNOSIS — R1031 Right lower quadrant pain: Secondary | ICD-10-CM | POA: Diagnosis not present

## 2015-10-18 DIAGNOSIS — N951 Menopausal and female climacteric states: Secondary | ICD-10-CM | POA: Diagnosis not present

## 2015-10-18 MED ORDER — ESTRADIOL 0.5 MG PO TABS: 0.5000 mg | ORAL_TABLET | Freq: Every day | ORAL | Status: DC

## 2015-10-18 MED ORDER — ONDANSETRON 4 MG PO TBDP: 4.0000 mg | ORAL_TABLET | Freq: Four times a day (QID) | ORAL | Status: DC | PRN

## 2015-10-18 NOTE — Progress Notes (Signed)
PELVIC US TA/TV: homogenous anteverted uterus, anterior non shadowing echogenic foci w/in the myometrium 2.7 x 2.46mm ,normal ov's bilat (mobile),EEC 1.9 mm,no free fluid seen,bilat adnexal pain during ultrasound

## 2015-10-18 NOTE — Progress Notes (Signed)
[facility] Visit  10/18/2015          Patient name: Shelly Hanson MRN [id number]  Date of birth: 02/10/1961  CC & HPI:  Shelly Hanson is a 55 y.o. female presenting today for follow up of lab results and imaging results. Pt reports that she had a transvaginal and pelvic US completed today due to her RLQ pain. Pt notes that she was seen 8 days ago for PMB. Pt reports that on her visit to the office on 07/22/15 she was placed on provera x 2 weeks q 3 months. Pt reports that her first episode with vaginal bleeding while on provera. Pt states that her bleeding episode ended 5 days ago. Pt has associated symptoms of RLQ pain. Denies vaginal bleeding and any other symptoms.    ROS:  ROS -vaginal bleeding +RLQ pain A complete 10 system review of systems was obtained and all systems are negative except as noted in the HPI and PMH.   Pertinent History Reviewed:   Reviewed: Significant for  Medical         Past Medical History  Diagnosis Date  . High cholesterol   . GERD (gastroesophageal reflux disease)   . Pinched nerve in neck   . Vitamin D deficiency disease   . Hyperlipemia   . Sciatic leg pain   . Pinched nerve     pinched nerve in back                               Surgical Hx:    Past Surgical History  Procedure Laterality Date  . Foot surgery      for a fx.   . Cholecystectomy    . Dilation and curettage of uterus      x 2   . Colonoscopy N/A 11/17/2013    Procedure: COLONOSCOPY;  Surgeon: Malissa HippoNajeeb U Rehman, MD;  [facility];  Service: Endoscopy;  Laterality: N/A;  1030  . Esophagogastroduodenoscopy N/A 11/17/2013    Procedure: ESOPHAGOGASTRODUODENOSCOPY (EGD);  Surgeon: Malissa HippoNajeeb U Rehman, MD;  [facility];  Service: Endoscopy;  Laterality: N/A;  . Balloon dilation N/A 11/17/2013    Procedure: BALLOON DILATION;  Surgeon: Malissa HippoNajeeb U Rehman, MD;  [facility];  Service: Endoscopy;  Laterality: N/A;  [name]. [name] N/A  11/17/2013    Procedure: Elease HashimotoMALONEY DILATION;  Surgeon: Malissa HippoNajeeb U Rehman, MD;  [facility];  Service: Endoscopy;  Laterality: N/A;  . Savory dilation N/A 11/17/2013    Procedure: SAVORY DILATION;  Surgeon: Malissa HippoNajeeb U Rehman, MD;  [facility];  Service: Endoscopy;  Laterality: N/A;   Medications: Reviewed & Updated - see associated section                       Current outpatient prescriptions:  .  Butalbital-APAP-Caffeine (CAPACET) 50-325-40 MG capsule, Take 1 capsule by mouth every 6 (six) hours as needed for pain or headache., Disp: 90 capsule, Rfl: 2 .  docusate sodium (COLACE) 100 MG capsule, Take 100 mg by mouth daily., Disp: , Rfl:  .  estradiol (ESTRACE) 1 MG tablet, Take 1 tablet (1 mg total) by mouth daily., Disp: 30 tablet, Rfl: 11 .  fluticasone (VERAMYST) 27.5 MCG/SPRAY nasal spray, Place 2 sprays into the nose daily., Disp: , Rfl:  .  gabapentin (NEURONTIN) 300 MG capsule, Take 600 mg by mouth 3 (three)  times daily., Disp: , Rfl:  .  medroxyPROGESTERone (PROVERA) 5 MG tablet, TAKE 1 TABLET DAILY FOR 2 WEEKS. REPEAT AS DIRECTED EVERY THIRD MONTH., Disp: 14 tablet, Rfl: 3 .  meloxicam (MOBIC) 15 MG tablet, , Disp: , Rfl:  .  omeprazole (PRILOSEC) 40 MG capsule, Take 40 mg by mouth daily., Disp: , Rfl:  .  ranitidine (ZANTAC) 150 MG tablet, Take 150 mg by mouth 2 (two) times daily., Disp: , Rfl:  .  rosuvastatin (CRESTOR) 20 MG tablet, Take 20 mg by mouth daily., Disp: , Rfl:  .  traMADol (ULTRAM) 50 MG tablet, Take 1 tablet (50 mg total) by mouth every 6 (six) hours as needed for moderate pain or severe pain., Disp: 30 tablet, Rfl: 0 .  venlafaxine XR (EFFEXOR-XR) 37.5 MG 24 hr capsule, Take 2 capsules (75 mg total) by mouth daily., Disp: 30 capsule, Rfl: 11 .  Vitamin D, Ergocalciferol, (DRISDOL) 50000 UNITS CAPS capsule, Take 50,000 Units by mouth every 7 (seven) days. Takes on Saturdays., Disp: , Rfl:    Social History: Reviewed -  reports that she has been  smoking Cigarettes.  She has a 2.5 pack-year smoking history. She has never used smokeless tobacco.  Objective Findings:  Vitals: Blood pressure 122/80, weight 214 lb (97.07 kg).  Physical Examination: discussion only Results of ultrasound reviewed with the patient completely normal ultrasound Discussed with pt risks and benefits of lab/imaging results. At end of discussion, pt had opportunity to ask questions and has no further questions at this time.   Greater than 50% was spent in counseling and coordination of care with the patient. Total time greater than: 15 minutes   Assessment & Plan:   A:  1. Withdrawal bleeding with hormone replacement therapy  P:  1. Reduced estrace to 0.5 mg  2. Continue effexor 3. zofran OTD Rx PRN due to nausea from effexor   By signing my name below, I, Soijett Blue, attest that this documentation has been prepared under the direction and in the presence of Tilda BurrowJohn V Jolita Haefner, MD. Electronically Signed: Soijett Blue, ED Scribe. 10/18/2015. 11:26 AM.  I personally performed the services described in this documentation, which was SCRIBED in my presence. The recorded information has been reviewed and considered accurate. It has been edited as necessary during review. Tilda BurrowFERGUSON,Suvan Stcyr V, MD

## 2015-10-18 NOTE — Progress Notes (Signed)
Patient ID: Shelly GuessDebra Holley Adderly, female   DOB: 05/19/1960, 55 y.o.   MRN: [id number] Pt here today for results and discuss with Dr. Emelda FearFerguson.

## 2015-11-01 ENCOUNTER — Ambulatory Visit (INDEPENDENT_AMBULATORY_CARE_PROVIDER_SITE_OTHER): Payer: Medicare HMO | Admitting: Internal Medicine

## 2015-11-01 ENCOUNTER — Encounter (INDEPENDENT_AMBULATORY_CARE_PROVIDER_SITE_OTHER): Payer: Self-pay | Admitting: Internal Medicine

## 2015-11-01 VITALS — BP 110/74 | HR 80 | Temp 97.5°F | Ht 65.5 in | Wt 206.0 lb

## 2015-11-01 DIAGNOSIS — Z8719 Personal history of other diseases of the digestive system: Secondary | ICD-10-CM

## 2015-11-01 DIAGNOSIS — K921 Melena: Secondary | ICD-10-CM | POA: Diagnosis not present

## 2015-11-01 LAB — CBC
HCT: 42.9 % (ref 35.0–45.0)
Hemoglobin: 14.3 g/dL (ref 11.7–15.5)
MCH: 30.8 pg (ref 27.0–33.0)
MCHC: 33.3 g/dL (ref 32.0–36.0)
MCV: 92.5 fL (ref 80.0–100.0)
MPV: 9.9 fL (ref 7.5–12.5)
PLATELETS: 222 10*3/uL (ref 140–400)
RBC: 4.64 MIL/uL (ref 3.80–5.10)
RDW: 13.7 % (ref 11.0–15.0)
WBC: 7.2 10*3/uL (ref 3.8–10.8)

## 2015-11-01 MED ORDER — DICYCLOMINE HCL 10 MG PO CAPS: 10.0000 mg | ORAL_CAPSULE | Freq: Three times a day (TID) | ORAL | Status: DC

## 2015-11-01 NOTE — Progress Notes (Signed)
Subjective:    Patient ID: Shelly Hanson, female    DOB: Nov 11, 1960, 55 y.o.   MRN: [id number]  HPIHPI Here today for f/u. Hx of dysphagia and underwent and EGD/ED 2015r. She also underwent a colonoscopy for family hx of colon cancer in father who died at age 43.   Today she tells me she has hiccups, nausea and vomiting. She says everything smell bad.  She will have symptoms for 3-4 days.  She says she eats a couple of times a day. She says she has had some black stools but not in last few weeks. She has been taking Pepto Bismol.  No diarrhea. Weight loss has been intentional.  Weight loss of 21 pounds. No NSAIDS.  Stools softener every M-W-F.  Hx of cholecystectomy.  No recent diarrhea.  Exercises daily by walking.  No NSAIDS.           11/17/2013 EGD, ED & Colonoscopy  Indications: The patient is a 55 year old Caucasian female with chronic GERD and history of esophageal stricture now presents with solid dysphagia. Heartburn is well controlled with PPI. She is also undergoing screening colonoscopy. Family history significant for CRC in father at age 90 and died of metastatic disease 5 years later.  EGD findings; Soft stricture at GE junction without changes of esophagitis. This stricture was dilated with balloon dilator to 18 mm. Moderate size sliding hiatal hernia. Erosive antral gastritis  Colonoscopy findings; Normal colonoscopy.  Review of Systems Past Medical History  Diagnosis Date  . High cholesterol   . GERD (gastroesophageal reflux disease)   . Pinched nerve in neck   . Vitamin D deficiency disease   . Hyperlipemia   . Sciatic leg pain   . Pinched nerve     pinched nerve in back     Past Surgical History  Procedure Laterality Date  . Foot surgery      for a fx.   . Cholecystectomy    . Dilation and curettage of uterus      x  2   . Colonoscopy N/A 11/17/2013    Procedure: COLONOSCOPY;  Surgeon: Malissa Hippo, MD;  [facility];  Service: Endoscopy;  Laterality: N/A;  1030  . Esophagogastroduodenoscopy N/A 11/17/2013    Procedure: ESOPHAGOGASTRODUODENOSCOPY (EGD);  Surgeon: Malissa Hippo, MD;  [facility];  Service: Endoscopy;  Laterality: N/A;  . Balloon dilation N/A 11/17/2013    Procedure: BALLOON DILATION;  Surgeon: Malissa Hippo, MD;  [facility];  Service: Endoscopy;  Laterality: N/A;  [name] dilation N/A 11/17/2013    Procedure: [name] DILATION;  Surgeon: Malissa Hippo, MD;  [facility];  Service: Endoscopy;  Laterality: N/A;  . Savory dilation N/A 11/17/2013    Procedure: SAVORY DILATION;  Surgeon: Malissa Hippo, MD;  [facility];  Service: Endoscopy;  Laterality: N/A;    Allergies  Allergen Reactions  . Codeine   . Flagyl [Metronidazole]   . Naproxen   . Sulfa Antibiotics     Current Outpatient Prescriptions on File Prior to Visit  Medication Sig Dispense Refill  . Butalbital-APAP-Caffeine (CAPACET) 50-325-40 MG capsule Take 1 capsule by mouth every 6 (six) hours as needed for pain or headache. 90 capsule 2  . docusate sodium (COLACE) 100 MG capsule Take 100 mg by mouth daily.    Marland Kitchen estradiol (ESTRACE) 0.5 MG tablet Take 1 tablet (0.5 mg total) by mouth daily. 30 tablet 11  . fluticasone (  VERAMYST) 27.5 MCG/SPRAY nasal spray Place 2 sprays into the nose daily.    Marland Kitchen. gabapentin (NEURONTIN) 300 MG capsule Take 600 mg by mouth 3 (three) times daily.    . medroxyPROGESTERone (PROVERA) 5 MG tablet TAKE 1 TABLET DAILY FOR 2 WEEKS. REPEAT AS DIRECTED EVERY THIRD MONTH. 14 tablet 3  . meloxicam (MOBIC) 15 MG tablet     . omeprazole (PRILOSEC) 40 MG capsule Take 40 mg by mouth daily.    . ondansetron (ZOFRAN ODT) 4 MG disintegrating tablet Take 1 tablet (4 mg total) by mouth every 6 (six) hours as needed for nausea. 20 tablet 0  . ranitidine  (ZANTAC) 150 MG tablet Take 150 mg by mouth 2 (two) times daily.    . rosuvastatin (CRESTOR) 20 MG tablet Take 20 mg by mouth daily.    . traMADol (ULTRAM) 50 MG tablet Take 1 tablet (50 mg total) by mouth every 6 (six) hours as needed for moderate pain or severe pain. 30 tablet 0  . venlafaxine XR (EFFEXOR-XR) 37.5 MG 24 hr capsule Take 2 capsules (75 mg total) by mouth daily. 30 capsule 11  . Vitamin D, Ergocalciferol, (DRISDOL) 50000 UNITS CAPS capsule Take 50,000 Units by mouth every 7 (seven) days. Takes on Saturdays.     No current facility-administered medications on file prior to visit.        Objective:   Physical ExamBlood pressure 110/74, pulse 80, temperature 97.5 F (36.4 C), height 5' 5.5" (1.664 m), weight 206 lb (93.441 kg). Alert and oriented. Skin warm and dry. Oral mucosa is moist.   . Sclera anicteric, conjunctivae is pink. Thyroid not enlarged. No cervical lymphadenopathy. Lungs clear. Heart regular rate and rhythm.  Abdomen is soft. Bowel sounds are positive. No hepatomegaly. No abdominal masses felt. No tenderness.  No edema to lower extremities. Stool brown and guaiac negative.        Assessment & Plan:  Possible melena. Stool brown today.  Plan, CBC, 3 stool cards sent home with patient.   Possible IBS: Dicyclomine 10mg  TID

## 2015-11-01 NOTE — Patient Instructions (Signed)
Rx for Dicyclomine 10mg TID OV in 1 year 

## 2015-11-04 ENCOUNTER — Other Ambulatory Visit: Payer: Self-pay | Admitting: Obstetrics and Gynecology

## 2015-11-04 NOTE — Telephone Encounter (Signed)
Refill tramadol 30 tablets, refills

## 2015-11-05 ENCOUNTER — Other Ambulatory Visit: Payer: Self-pay | Admitting: *Deleted

## 2015-11-26 ENCOUNTER — Other Ambulatory Visit: Payer: Self-pay | Admitting: Obstetrics and Gynecology

## 2015-12-02 ENCOUNTER — Telehealth: Payer: Self-pay | Admitting: Obstetrics and Gynecology

## 2015-12-02 MED ORDER — ESTRADIOL 0.5 MG PO TABS: 0.5000 mg | ORAL_TABLET | Freq: Every day | ORAL | 11 refills | Status: DC

## 2015-12-02 NOTE — Telephone Encounter (Signed)
Pt aware that the Rx would [facility] and to call them back and let them know we have sent it in.

## 2015-12-06 ENCOUNTER — Telehealth: Payer: Self-pay | Admitting: Obstetrics and Gynecology

## 2015-12-06 NOTE — Telephone Encounter (Signed)
Estradiol 0.5 mg clarification completed by Dr. Emelda FearFerguson.

## 2015-12-19 ENCOUNTER — Ambulatory Visit: Payer: Medicare HMO | Admitting: Pain Medicine

## 2015-12-19 DIAGNOSIS — M47816 Spondylosis without myelopathy or radiculopathy, lumbar region: Secondary | ICD-10-CM | POA: Insufficient documentation

## 2015-12-19 DIAGNOSIS — G8929 Other chronic pain: Secondary | ICD-10-CM | POA: Insufficient documentation

## 2015-12-19 DIAGNOSIS — M542 Cervicalgia: Secondary | ICD-10-CM

## 2015-12-19 DIAGNOSIS — F1190 Opioid use, unspecified, uncomplicated: Secondary | ICD-10-CM | POA: Insufficient documentation

## 2015-12-19 DIAGNOSIS — Z0189 Encounter for other specified special examinations: Secondary | ICD-10-CM | POA: Insufficient documentation

## 2015-12-19 DIAGNOSIS — M48061 Spinal stenosis, lumbar region without neurogenic claudication: Secondary | ICD-10-CM | POA: Insufficient documentation

## 2015-12-19 DIAGNOSIS — M5442 Lumbago with sciatica, left side: Secondary | ICD-10-CM

## 2015-12-19 DIAGNOSIS — K5730 Diverticulosis of large intestine without perforation or abscess without bleeding: Secondary | ICD-10-CM | POA: Insufficient documentation

## 2015-12-19 DIAGNOSIS — Z79891 Long term (current) use of opiate analgesic: Secondary | ICD-10-CM | POA: Insufficient documentation

## 2015-12-19 DIAGNOSIS — M79603 Pain in arm, unspecified: Secondary | ICD-10-CM

## 2015-12-19 DIAGNOSIS — Z5181 Encounter for therapeutic drug level monitoring: Secondary | ICD-10-CM | POA: Insufficient documentation

## 2015-12-19 DIAGNOSIS — M5441 Lumbago with sciatica, right side: Secondary | ICD-10-CM

## 2015-12-19 DIAGNOSIS — M4310 Spondylolisthesis, site unspecified: Secondary | ICD-10-CM | POA: Insufficient documentation

## 2015-12-19 DIAGNOSIS — M79604 Pain in right leg: Secondary | ICD-10-CM

## 2015-12-31 ENCOUNTER — Other Ambulatory Visit (INDEPENDENT_AMBULATORY_CARE_PROVIDER_SITE_OTHER): Payer: Self-pay | Admitting: Internal Medicine

## 2015-12-31 ENCOUNTER — Telehealth (INDEPENDENT_AMBULATORY_CARE_PROVIDER_SITE_OTHER): Payer: Self-pay | Admitting: Internal Medicine

## 2015-12-31 ENCOUNTER — Other Ambulatory Visit: Payer: Self-pay | Admitting: Obstetrics and Gynecology

## 2015-12-31 DIAGNOSIS — Z8719 Personal history of other diseases of the digestive system: Secondary | ICD-10-CM

## 2015-12-31 MED ORDER — DICYCLOMINE HCL 10 MG PO CAPS
10.0000 mg | ORAL_CAPSULE | Freq: Three times a day (TID) | ORAL | 5 refills | Status: DC
Start: 2015-12-31 — End: 2016-05-04

## 2015-12-31 NOTE — Telephone Encounter (Signed)
error 

## 2015-12-31 NOTE — Telephone Encounter (Signed)
Rx for Dicyclomine reordered 

## 2015-12-31 NOTE — Telephone Encounter (Signed)
Rx for Dicyclomine 10mg  QID faxed to her pharmacy;

## 2016-01-01 ENCOUNTER — Telehealth (INDEPENDENT_AMBULATORY_CARE_PROVIDER_SITE_OTHER): Payer: Self-pay | Admitting: Internal Medicine

## 2016-01-01 NOTE — Telephone Encounter (Signed)
Patient called, stated that you had prescribed a something for IBS.  Stated that [facility] was faxing a request to have it filled.  She wants to know if we have gotten it.  [phone]

## 2016-01-01 NOTE — Telephone Encounter (Signed)
I have not seen anything on this patient from [facility].

## 2016-01-02 NOTE — Telephone Encounter (Signed)
I called the patient and lmoam °

## 2016-01-02 NOTE — Telephone Encounter (Signed)
Let patient know I have reordered

## 2016-01-02 NOTE — Telephone Encounter (Signed)
Patient was called and a message was left that ,Shelly Hanson had reordered her medication.

## 2016-01-06 ENCOUNTER — Other Ambulatory Visit: Payer: Self-pay | Admitting: Obstetrics and Gynecology

## 2016-01-07 ENCOUNTER — Other Ambulatory Visit: Payer: Self-pay | Admitting: Obstetrics & Gynecology

## 2016-01-07 ENCOUNTER — Telehealth: Payer: Self-pay | Admitting: Obstetrics and Gynecology

## 2016-01-07 ENCOUNTER — Telehealth: Payer: Self-pay | Admitting: *Deleted

## 2016-01-07 MED ORDER — ONDANSETRON 8 MG PO TBDP: 8.0000 mg | ORAL_TABLET | Freq: Three times a day (TID) | ORAL | 0 refills | Status: DC | PRN

## 2016-01-07 MED ORDER — TRAMADOL HCL 50 MG PO TABS: 50.0000 mg | ORAL_TABLET | Freq: Four times a day (QID) | ORAL | 0 refills | Status: DC | PRN

## 2016-01-07 NOTE — Telephone Encounter (Signed)
Pt requesting a refill on Tr[facility]ad[facility]scribed to [facility]

## 2016-01-09 ENCOUNTER — Telehealth: Payer: Self-pay | Admitting: Obstetrics and Gynecology

## 2016-01-09 NOTE — Telephone Encounter (Signed)
Pharmacy changed in pt chart per her request [facility]

## 2016-01-13 ENCOUNTER — Telehealth: Payer: Self-pay | Admitting: Obstetrics and Gynecology

## 2016-01-13 NOTE — Telephone Encounter (Signed)
Pt states she needs a new Rx for Tramad[facility]. Pt states [facility] is now closed and that was were the original Rx for Tramadol was sent.

## 2016-01-15 ENCOUNTER — Telehealth (INDEPENDENT_AMBULATORY_CARE_PROVIDER_SITE_OTHER): Payer: Self-pay | Admitting: Internal Medicine

## 2016-01-15 NOTE — Telephone Encounter (Signed)
Written Rx for Dicyclomine sent to her pharmacy

## 2016-01-17 ENCOUNTER — Telehealth: Payer: Self-pay | Admitting: Obstetrics and Gynecology

## 2016-01-17 NOTE — Telephone Encounter (Signed)
Spoke with Pharmacist from [facility] and [facility] has closed and Rx have been transferred. [name], pharmacy tech states they do not have the Rx for Tramadol. Gave a verbal order per Rx for Tramadol in [facility] from 01/07/2016. Pt informed.

## 2016-02-03 NOTE — Telephone Encounter (Signed)
PT will have f/u appt and can have this addressed at those appts.

## 2016-02-19 ENCOUNTER — Other Ambulatory Visit: Payer: Self-pay | Admitting: Obstetrics and Gynecology

## 2016-03-16 ENCOUNTER — Telehealth: Payer: Self-pay | Admitting: Obstetrics and Gynecology

## 2016-03-16 ENCOUNTER — Telehealth: Payer: Self-pay | Admitting: *Deleted

## 2016-03-16 ENCOUNTER — Other Ambulatory Visit: Payer: Self-pay | Admitting: Obstetrics & Gynecology

## 2016-03-16 NOTE — Telephone Encounter (Signed)
Informed patient she needs to make appt to have prescription refilled.

## 2016-03-16 NOTE — Telephone Encounter (Signed)
Patient would like tramadol prescription refilled.

## 2016-03-25 ENCOUNTER — Encounter: Payer: Self-pay | Admitting: Obstetrics and Gynecology

## 2016-03-25 ENCOUNTER — Ambulatory Visit (INDEPENDENT_AMBULATORY_CARE_PROVIDER_SITE_OTHER): Payer: Medicare HMO | Admitting: Obstetrics and Gynecology

## 2016-03-25 VITALS — BP 110/86 | HR 81 | Ht 65.0 in | Wt 218.4 lb

## 2016-03-25 DIAGNOSIS — G44229 Chronic tension-type headache, not intractable: Secondary | ICD-10-CM

## 2016-03-25 MED ORDER — ONDANSETRON 8 MG PO TBDP: 8.0000 mg | ORAL_TABLET | Freq: Three times a day (TID) | ORAL | 2 refills | Status: DC | PRN

## 2016-03-25 MED ORDER — TRAMADOL HCL 50 MG PO TABS: 50.0000 mg | ORAL_TABLET | Freq: Four times a day (QID) | ORAL | 0 refills | Status: DC | PRN

## 2016-03-25 NOTE — Progress Notes (Signed)
[facility] Visit  03/25/2016         Patient name: Shelly Hanson MRN [id number]  Date of birth: 05/16/1960  CC & HPI:  Shelly Hanson is a 55 y.o. female presenting today for medication refill; requesting Rx for Tramadol and Zofran. She was started on the tramadol as supplemental medication for migraine HA. She takes it when her Fioricet is not providing enough relief. Pt uses 20 pills in a 6 month period. She has also been taking Zofran for her nausea with relief. At this time pt has no acute complaints.   ROS:  ROS  Otherwise negative for acute change except as noted in the HPI.  Pertinent History Reviewed:   Reviewed: Significant for Migraines Medical         Past Medical History:  Diagnosis Date  . GERD (gastroesophageal reflux disease)   . High cholesterol   . Hyperlipemia   . IBS (irritable bowel syndrome)   . Pinched nerve    pinched nerve in back   . Pinched nerve in neck   . Sciatic leg pain   . Vitamin D deficiency disease                               Surgical Hx:    Past Surgical History:  Procedure Laterality Date  . BALLOON DILATION N/A 11/17/2013   Procedure: BALLOON DILATION;  Surgeon: Malissa HippoNajeeb U Rehman, MD;  [facility];  Service: Endoscopy;  Laterality: N/A;  . CHOLECYSTECTOMY    . COLONOSCOPY N/A 11/17/2013   Procedure: COLONOSCOPY;  Surgeon: Malissa HippoNajeeb U Rehman, MD;  [facility];  Service: Endoscopy;  Laterality: N/A;  1030  . DILATION AND CURETTAGE OF UTERUS     x 2   . ESOPHAGOGASTRODUODENOSCOPY N/A 11/17/2013   Procedure: ESOPHAGOGASTRODUODENOSCOPY (EGD);  Surgeon: Malissa HippoNajeeb U Rehman, MD;  [facility];  Service: Endoscopy;  Laterality: N/A;  . FOOT SURGERY     for a fx.   Marland Kitchen. [name] N/A 11/17/2013   Procedure: Elease HashimotoMALONEY DILATION;  Surgeon: Malissa HippoNajeeb U Rehman, MD;  [facility];  Service: Endoscopy;  Laterality: N/A;  . SAVORY DILATION N/A 11/17/2013   Procedure: SAVORY DILATION;  Surgeon:  Malissa HippoNajeeb U Rehman, MD;  [facility];  Service: Endoscopy;  Laterality: N/A;   Medications: Reviewed & Updated - see associated section                       Current Outpatient Prescriptions:  .  Butalbital-APAP-Caffeine (CAPACET) 50-325-40 MG capsule, Take 1 capsule by mouth every 6 (six) hours as needed for pain or headache., Disp: 90 capsule, Rfl: 2 .  dicyclomine (BENTYL) 10 MG capsule, Take 1 capsule (10 mg total) by mouth 4 (four) times daily -  before meals and at bedtime., Disp: 120 capsule, Rfl: 5 .  docusate sodium (COLACE) 100 MG capsule, Take 100 mg by mouth daily., Disp: , Rfl:  .  estradiol (ESTRACE) 0.5 MG tablet, Take 1 tablet (0.5 mg total) by mouth daily., Disp: 30 tablet, Rfl: 11 .  fluticasone (VERAMYST) 27.5 MCG/SPRAY nasal spray, Place 2 sprays into the nose daily., Disp: , Rfl:  .  gabapentin (NEURONTIN) 300 MG capsule, Take 600 mg by mouth 3 (three) times daily., Disp: , Rfl:  .  medroxyPROGESTERone (PROVERA) 5 MG tablet, TAKE 1 TABLET DAILY FOR 2 WEEKS. REPEAT AS  DIRECTED EVERY THIRD MONTH., Disp: 14 tablet, Rfl: 3 .  meloxicam (MOBIC) 15 MG tablet, , Disp: , Rfl:  .  Omega-3 Fatty Acids (FISH OIL) 1000 MG CAPS, Take by mouth., Disp: , Rfl:  .  omeprazole (PRILOSEC) 40 MG capsule, Take 40 mg by mouth daily., Disp: , Rfl:  .  ondansetron (ZOFRAN ODT) 4 MG disintegrating tablet, Take 1 tablet (4 mg total) by mouth every 6 (six) hours as needed for nausea., Disp: 20 tablet, Rfl: 0 .  ondansetron (ZOFRAN ODT) 8 MG disintegrating tablet, Take 1 tablet (8 mg total) by mouth every 8 (eight) hours as needed for nausea or vomiting., Disp: 20 tablet, Rfl: 0 .  ranitidine (ZANTAC) 150 MG tablet, Take 150 mg by mouth 2 (two) times daily., Disp: , Rfl:  .  rosuvastatin (CRESTOR) 20 MG tablet, Take 20 mg by mouth daily., Disp: , Rfl:  .  traMADol (ULTRAM) 50 MG tablet, TAKE 1 TABLET EVERY 6 HOURS AS NEEDED FOR MODERATE OR SEVERE PAIN, Disp: 30 tablet, Rfl: 0 .  traMADol  (ULTRAM) 50 MG tablet, Take 1 tablet (50 mg total) by mouth every 6 (six) hours as needed., Disp: 20 tablet, Rfl: 0 .  venlafaxine XR (EFFEXOR-XR) 37.5 MG 24 hr capsule, Take 2 capsules (75 mg total) by mouth daily., Disp: 30 capsule, Rfl: 11 .  Vitamin D, Ergocalciferol, (DRISDOL) 50000 UNITS CAPS capsule, Take 50,000 Units by mouth every 7 (seven) days. Takes on Saturdays., Disp: , Rfl:    Social History: Reviewed -  reports that she has been smoking Cigarettes.  She has a 2.50 pack-year smoking history. She has never used smokeless tobacco.  Objective Findings:  Vitals: Blood pressure 110/86, pulse 81, height  (1.651 m), weight 218 lb 6.4 oz (99.1 kg).  Physical Examination: General appearance - alert, well appearing, and in no distress Mental status - alert, oriented to person, place, and time Pelvic - examination not indicated   Assessment & Plan:   A:  1.  Migraine HA's stable on fioricet and occasional tramadol, zofran prn   P:  1. Refill tramadol and Zofran    By signing my name below, I, Freida Busman, attest that this documentation has been prepared under the direction and in the presence of Tilda Burrow, MD . Electronically Signed: Freida Busman, Scribe. 03/25/2016. 3:26 PM. I personally performed the services described in this documentation, which was SCRIBED in my presence. The recorded information has been reviewed and considered accurate. It has been edited as necessary during review. Tilda Burrow, MD

## 2016-03-25 NOTE — Patient Instructions (Signed)
Continue to use the Butalbital as your main medication for headaches. Save the Tramadol for severe headaches unresponsive to butalbital.

## 2016-03-31 DIAGNOSIS — G44229 Chronic tension-type headache, not intractable: Secondary | ICD-10-CM | POA: Insufficient documentation

## 2016-05-04 ENCOUNTER — Other Ambulatory Visit (INDEPENDENT_AMBULATORY_CARE_PROVIDER_SITE_OTHER): Payer: Self-pay | Admitting: Internal Medicine

## 2016-05-04 DIAGNOSIS — Z8719 Personal history of other diseases of the digestive system: Secondary | ICD-10-CM

## 2016-05-11 DIAGNOSIS — G894 Chronic pain syndrome: Secondary | ICD-10-CM | POA: Insufficient documentation

## 2016-05-11 NOTE — Progress Notes (Signed)
Patient's Name: Shelly Hanson  MRN: [id number]  Referring Provider: Vesta Mixer  DOB: 1960/04/29  PCP: [facility]  DOS: 05/12/2016  Note by: Kathlen Brunswick. Dossie Arbour, MD  Service setting: Ambulatory outpatient  Specialty: Interventional Pain Management  Location: [facility] (AMB) Pain Management Facility    Patient type: New Patient   Primary Reason(s) for Visit: Initial Patient Evaluation CC: Back Pain (lower) and Neck Pain  HPI  Shelly Hanson is a 56 y.o. year old, female patient, who comes today for an initial evaluation. She has Dysphagia, unspecified(787.20); High cholesterol; Perimenopausal vasomotor symptoms; Long term current use of opiate analgesic; Long term prescription opiate use; Opiate use; Encounter for therapeutic drug level monitoring; Encounter for pain management planning; Chronic low back pain (Location of Primary Source of Pain) (Midline) (Bilateral) (R>L); Chronic lower extremity pain (Location of Secondary source of pain) (Right); Chronic neck pain (Location of Tertiary source of pain) (Right); Chronic upper extremity pain (Bilateral) (R>L); Diverticulosis of colon; Anterolisthesis (L3 over L4 and L4 over L5); Lumbar facet hypertrophy (multilevel); Lumbar facet syndrome (Bilateral) (R>L); Lumbar foraminal stenosis (L4) (Left); Chronic tension-type headache, not intractable; Chronic pain syndrome; and Chronic lumbar radicular pain (Location of Secondary source of pain) (Right) (L5) on her problem list.. Her primarily concern today is the Back Pain (lower) and Neck Pain  Pain Assessment: Self-Reported Pain Score: 5 /10             Reported level is compatible with observation.       Pain Type: Chronic pain Pain Location: Back Pain Orientation: Lower Pain Descriptors / Indicators: Dull Pain Frequency: Constant  Onset and Duration: Gradual Cause of pain: Unknown Severity: Getting worse Timing: Morning, Afternoon, Evening and After activity or  exercise Aggravating Factors: Intercourse (sex), Kneeling, Lifiting, Nerve blocks, Prolonged sitting, Prolonged standing, Twisting and Walking Alleviating Factors: Medications Associated Problems: Nausea, Sweating, Pain that wakes patient up and Pain that does not allow patient to sleep Quality of Pain: Agonizing, Cramping, Sharp and Stabbing Previous Examinations or Tests: CT scan, MRI scan and X-rays Previous Treatments: Epidural steroid injections  The patient comes into the clinics today for the first time for a chronic pain management evaluation. According to the patient the worst pain is that of the lower back starting in the midline and going towards the right side. The patient denies any prior surgeries physical therapy but does admit to having had some lumbar epidural steroid injections done by Dr. Francesco Runner in [facility]. Her next worst pain is that of the right lower extremity. Again she denies any prior surgeries, nerve blocks, or physical therapy. The pain is described to go down from the buttocks area to the top of the foot and the big toe following an L5 dermatomal distribution. She does admit to weakness and numbness as well as the pain. Next is her neck pain which is located in the posterior aspect of the neck and going towards the right side. Again she denies any surgeries, nerve blocks, or physical therapy. This neck pain is associated with bilateral upper extremity pain and numbness most down into all of her fingers especially when she raises her arms above her head. The patient indicates currently taking gabapentin 2 tablets by mouth 3 times a day, which does seem to help but it occasionally will give her blurred vision.  Today I took the time to provide the patient with information regarding my pain practice. The patient was informed that my practice is  divided into two sections: an interventional pain management section, as well as a completely separate and distinct medication  management section. The interventional portion of my practice takes place on Tuesdays and Thursdays, while the medication management is conducted on Mondays and Wednesdays. Because of the amount of documentation required on both them, they are kept separated. This means that there is the possibility that the patient may be scheduled for a procedure on Tuesday, while also having a medication management appointment on Wednesday. I have also informed the patient that because of current staffing and facility limitations, I no longer take patients for medication management only. To illustrate the reasons for this, I gave the patient the example of a surgeon and how inappropriate it would be to refer a patient to his/her practice so that they write for the post-procedure antibiotics on a surgery done by someone else.   The patient was informed that joining my practice means that they are open to any and all interventional therapies. I clarified for the patient that this does not mean that they will be forced to have any procedures done. What it means is that patients looking for a practitioner to simply write for their pain medications and not take advantage of other interventional techniques will be better served by a different practitioner, other than myself. I made it clear that I prefer to spend my time providing those services that I specialize in.  The patient was also made aware of my Comprehensive Pain Management Safety Guidelines where by joining my practice, they limit all of their nerve blocks and joint injections to those done by our practice, for as long as we are retained to manage their care.   Historic Controlled Substance Pharmacotherapy Review  PMP and historical list of controlled substances: Tramadol 50 mg 4 times a day, butalbital/APAP/caffeine 50/325/40 Highest analgesic regimen found: Tramadol 50 mg 1 tablet by mouth 4 times a day. (200 mg/day) Most recent analgesic: Tramadol 50 mg 1 tablet  by mouth 4 times a day. (200 mg/day) Highest recorded MME/day: 20 mg/day MME/day: 20 mg/day Medications: The patient did not bring the medication(s) to the appointment, as requested in our "New Patient Package" Pharmacodynamics: Desired effects: Analgesia: The patient reports >50% benefit. Reported improvement in function: The patient reports medication allows her to accomplish basic ADLs. Clinically meaningful improvement in function (CMIF): Sustained CMIF goals met Perceived effectiveness: Described as relatively effective, allowing for increase in activities of daily living (ADL) Undesirable effects: Side-effects or Adverse reactions: None reported Historical Monitoring: The patient  reports that she does not use drugs.. No results found for: MDMA, COCAINSCRNUR, PCPSCRNUR, THCU, ETH Historical Background Evaluation: [place] PDMP: Six (6) year initial data search conducted.             [place] Department of public safety, offender search: Editor, commissioning Information) Non-contributory Risk Assessment Profile: Aberrant behavior: None observed or detected today Risk factors for fatal opioid overdose: None identified today Fatal overdose hazard ratio (HR): Calculation deferred Non-fatal overdose hazard ratio (HR): Calculation deferred Risk of opioid abuse or dependence: 0.7-3.0% with doses ? 36 MME/day and 6.1-26% with doses ? 120 MME/day. Substance use disorder (SUD) risk level: Pending results of Medical Psychology Evaluation for SUD Opioid risk tool (ORT) (Total Score): 1  ORT Scoring interpretation table:  Score <3 = Low Risk for SUD  Score between 4-7 = Moderate Risk for SUD  Score >8 = High Risk for Opioid Abuse   PHQ-2 Depression Scale:  Total score: 0  PHQ-2 Scoring interpretation table: (Score and probability of major depressive disorder)  Score 0 = No depression  Score 1 = 15.4% Probability  Score 2 = 21.1% Probability  Score 3 = 38.4% Probability  Score 4 = 45.5% Probability  Score 5 =  56.4% Probability  Score 6 = 78.6% Probability   PHQ-9 Depression Scale:  Total score: 0  PHQ-9 Scoring interpretation table:  Score 0-4 = No depression  Score 5-9 = Mild depression  Score 10-14 = Moderate depression  Score 15-19 = Moderately severe depression  Score 20-27 = Severe depression (2.4 times higher risk of SUD and 2.89 times higher risk of overuse)   Pharmacologic Plan: Pending ordered tests and/or consults  Meds  The patient has a current medication list which includes the following prescription(s): butalbital-apap-caffeine, dicyclomine, docusate sodium, estradiol, fluticasone, gabapentin, medroxyprogesterone, meloxicam, fish oil, omeprazole, ondansetron, ranitidine, rosuvastatin, tramadol, venlafaxine xr, and vitamin d (ergocalciferol).  Current Outpatient Prescriptions on File Prior to Visit  Medication Sig  . Butalbital-APAP-Caffeine (CAPACET) 50-325-40 MG capsule Take 1 capsule by mouth every 6 (six) hours as needed for pain or headache.  . dicyclomine (BENTYL) 10 MG capsule TAKE 1 CAPSULE FOUR TIMES DAILY BEFORE MEALS  AND AT BEDTIME  . docusate sodium (COLACE) 100 MG capsule Take 100 mg by mouth daily.  Marland Kitchen estradiol (ESTRACE) 0.5 MG tablet Take 1 tablet (0.5 mg total) by mouth daily.  . fluticasone (VERAMYST) 27.5 MCG/SPRAY nasal spray Place 2 sprays into the nose daily.  Marland Kitchen gabapentin (NEURONTIN) 300 MG capsule Take 600 mg by mouth 3 (three) times daily.  . medroxyPROGESTERone (PROVERA) 5 MG tablet TAKE 1 TABLET DAILY FOR 2 WEEKS. REPEAT AS DIRECTED EVERY THIRD MONTH.  . meloxicam (MOBIC) 15 MG tablet   . Omega-3 Fatty Acids (FISH OIL) 1000 MG CAPS Take by mouth.  Marland Kitchen omeprazole (PRILOSEC) 40 MG capsule Take 40 mg by mouth daily.  . ondansetron (ZOFRAN ODT) 4 MG disintegrating tablet Take 1 tablet (4 mg total) by mouth every 6 (six) hours as needed for nausea.  . ranitidine (ZANTAC) 150 MG tablet Take 150 mg by mouth 2 (two) times daily.  . rosuvastatin (CRESTOR) 20 MG  tablet Take 20 mg by mouth daily.  . traMADol (ULTRAM) 50 MG tablet TAKE 1 TABLET EVERY 6 HOURS AS NEEDED FOR MODERATE OR SEVERE PAIN  . venlafaxine XR (EFFEXOR-XR) 37.5 MG 24 hr capsule Take 2 capsules (75 mg total) by mouth daily.  . Vitamin D, Ergocalciferol, (DRISDOL) 50000 UNITS CAPS capsule Take 50,000 Units by mouth every 7 (seven) days. Takes on Saturdays.   No current facility-administered medications on file prior to visit.    Imaging Review  Lumbosacral Imaging: Lumbar MR wo contrast:  Results for orders placed during the hospital encounter of 09/19/14  MR Lumbar Spine Wo Contrast   Narrative CLINICAL DATA:  56 year old female with lumbar back pain radiating to both lower extremities. Right greater than left extremity numbness. Intermittent symptoms for 2 years. Initial encounter.  EXAM: MRI LUMBAR SPINE WITHOUT CONTRAST  TECHNIQUE: Multiplanar, multisequence MR imaging of the lumbar spine was performed. No intravenous contrast was administered.  COMPARISON:  Abdomen radiographs 09/30/2007.  FINDINGS: Normal lumbar segmentation demonstrated on the comparison. Trace anterolisthesis at L3-L4 and L4-L5. Otherwise normal vertebral height and alignment. No marrow edema or evidence of acute osseous abnormality.  Visualized lower thoracic spinal cord is normal with conus medularis at L1.  Visualized abdominal viscera and paraspinal soft tissues are within normal limits.  T11-T12:  Negative.  T12-L1:  Negative.  L1-L2:  Negative.  L2-L3: Mild disc desiccation, disc space loss, and circumferential disc bulge. Mild facet hypertrophy. Trace facet joint fluid. No stenosis.  L3-L4: Trace anterolisthesis. Moderate facet hypertrophy. Trace facet joint fluid. Negative disc. No stenosis.  L4-L5: Trace anterolisthesis. Mild disc desiccation and circumferential disc bulge. Small left foraminal level annular fissure (series 6, image 29). Moderate facet and ligament  flavum hypertrophy. Mild left L4 foraminal stenosis.  L5-S1: Disc space loss. Circumferential (mostly far lateral and anterior) disc osteophyte complex. Chronic degenerative endplate changes. No stenosis.  IMPRESSION: 1. Advanced appearing chronic disc degeneration at L5-S1, but without associated stenosis. 2. Otherwise the dominant finding is moderate lumbar facet arthropathy at L3-L4 and L4-L5 in this setting of trace anterolisthesis. Still, no significant spinal stenosis but there is mild left L4 foraminal stenosis.   Electronically Signed   By: Genevie Ann M.D.   On: 09/19/2014 15:57    Note: Available results from prior imaging studies were reviewed.        ROS  Cardiovascular History: Negative for hypertension, coronary artery diseas, myocardial infraction, anticoagulant therapy or heart failure Pulmonary or Respiratory History: Smoker Neurological History: Negative for epilepsy, stroke, urinary or fecal inontinence, spina bifida or tethered cord syndrome Review of Past Neurological Studies: No results found for this or any previous visit. Psychological-Psychiatric History: Panic Attacks Gastrointestinal History: Hiatal hernia, Reflux or heatburn and Irritable Bowel Syndrome (IBS) Genitourinary History: Negative for nephrolithiasis, hematuria, renal failure or chronic kidney disease Hematological History: Brusing easily Endocrine History: Negative for diabetes or thyroid disease Rheumatologic History: Negative for lupus, osteoarthritis, rheumatoid arthritis, myositis, polymyositis or fibromyagia Musculoskeletal History: Negative for myasthenia gravis, muscular dystrophy, multiple sclerosis or malignant hyperthermia Work History: Disabled  Allergies  Ms. Treptow is allergic to codeine; flagyl [metronidazole]; naproxen; and sulfa antibiotics.  Laboratory Chemistry  Inflammation Markers No results found for: ESRSEDRATE, CRP Renal Function Lab Results  Component Value Date    BUN 24 (H) 07/12/2015   CREATININE 0.68 07/12/2015   GFRAA >60 07/12/2015   GFRNONAA >60 07/12/2015   Hepatic Function Lab Results  Component Value Date   AST 26 07/12/2015   ALT 21 07/12/2015   ALBUMIN 4.4 07/12/2015   Electrolytes Lab Results  Component Value Date   NA 139 07/12/2015   K 4.0 07/12/2015   CL 104 07/12/2015   CALCIUM 9.1 07/12/2015   Pain Modulating Vitamins No results found for: Maralyn Sago [id number], [id number], [id number], 25OHVITD1, 25OHVITD2, 25OHVITD3, VITAMINB12 Coagulation Parameters Lab Results  Component Value Date   PLT 222 11/01/2015   Cardiovascular Lab Results  Component Value Date   HGB 14.3 11/01/2015   HCT 42.9 11/01/2015   Note: Lab results reviewed.  [place]  Drug: Ms. Verley  reports that she does not use drugs. Alcohol:  reports that she does not drink alcohol. Tobacco:  reports that she has been smoking Cigarettes.  She has a 2.50 pack-year smoking history. She has never used smokeless tobacco. Medical:  has a past medical history of Abdominal wall pain in right lower quadrant (09/04/2014); GERD (gastroesophageal reflux disease); High cholesterol; Hyperlipemia; IBS (irritable bowel syndrome); Migraine headache (12/18/2014); Pinched nerve; Pinched nerve in neck; Sciatic leg pain; and Vitamin D deficiency disease. Family: family history includes Cancer in her father; Colon cancer in her father; Depression in her mother; Diabetes in her mother; Heart disease in her brother; Hyperlipidemia in her brother, brother, and mother; Hypertension in her father; Seizures in her father.  Past Surgical  History:  Procedure Laterality Date  . BALLOON DILATION N/A 11/17/2013   Procedure: BALLOON DILATION;  Surgeon: Rogene Houston, MD;  [facility]: [facility];  Service: Endoscopy;  Laterality: N/A;  . CHOLECYSTECTOMY    . COLONOSCOPY N/A 11/17/2013   Procedure: COLONOSCOPY;  Surgeon: Rogene Houston, MD;  Location: [facility];  Service: Endoscopy;   Laterality: N/A;  1030  . DILATION AND CURETTAGE OF UTERUS     x 2   . ESOPHAGOGASTRODUODENOSCOPY N/A 11/17/2013   Procedure: ESOPHAGOGASTRODUODENOSCOPY (EGD);  Surgeon: Rogene Houston, MD;  Location: [facility];  Service: Endoscopy;  Laterality: N/A;  . FOOT SURGERY     for a fx.   Marland Kitchen [name] N/A 11/17/2013   Procedure: Venia Minks DILATION;  Surgeon: Rogene Houston, MD;  Location: [facility];  Service: Endoscopy;  Laterality: N/A;  . SAVORY DILATION N/A 11/17/2013   Procedure: SAVORY DILATION;  Surgeon: Rogene Houston, MD;  Location: [facility];  Service: Endoscopy;  Laterality: N/A;   Active Ambulatory Problems    Diagnosis Date Noted  . Dysphagia, unspecified(787.20) 10/12/2013  . High cholesterol 10/12/2013  . Perimenopausal vasomotor symptoms 07/22/2015  . Long term current use of opiate analgesic 12/19/2015  . Long term prescription opiate use 12/19/2015  . Opiate use 12/19/2015  . Encounter for therapeutic drug level monitoring 12/19/2015  . Encounter for pain management planning 12/19/2015  . Chronic low back pain (Location of Primary Source of Pain) (Midline) (Bilateral) (R>L) 12/19/2015  . Chronic lower extremity pain (Location of Secondary source of pain) (Right) 12/19/2015  . Chronic neck pain (Location of Tertiary source of pain) (Right) 12/19/2015  . Chronic upper extremity pain (Bilateral) (R>L) 12/19/2015  . Diverticulosis of colon 12/19/2015  . Anterolisthesis (L3 over L4 and L4 over L5) 12/19/2015  . Lumbar facet hypertrophy (multilevel) 12/19/2015  . Lumbar facet syndrome (Bilateral) (R>L) 12/19/2015  . Lumbar foraminal stenosis (L4) (Left) 12/19/2015  . Chronic tension-type headache, not intractable 03/31/2016  . Chronic pain syndrome 05/11/2016  . Chronic lumbar radicular pain (Location of Secondary source of pain) (Right) (L5) 05/12/2016   Resolved Ambulatory Problems    Diagnosis Date Noted  . Abdominal wall pain in right lower quadrant  09/04/2014   Past Medical History:  Diagnosis Date  . Abdominal wall pain in right lower quadrant 09/04/2014  . GERD (gastroesophageal reflux disease)   . High cholesterol   . Hyperlipemia   . IBS (irritable bowel syndrome)   . Migraine headache 12/18/2014  . Pinched nerve   . Pinched nerve in neck   . Sciatic leg pain   . Vitamin D deficiency disease    Constitutional Exam  General appearance: Well nourished, well developed, and well hydrated. In no apparent acute distress Vitals:   05/12/16 1308  BP: 118/78  Pulse: 83  Resp: 16  Temp: 98.4 F (36.9 C)  TempSrc: Oral  SpO2: 96%  Weight: 210 lb (95.3 kg)  Height: '5\' 5"'  (1.651 m)   BMI Assessment: Estimated body mass index is 34.95 kg/m as calculated from the following:   Height as of this encounter: '5\' 5"'  (1.651 m).   Weight as of this encounter: 210 lb (95.3 kg).  BMI interpretation table: BMI level Category Range association with higher incidence of chronic pain  <18 kg/m2 Underweight   18.5-24.9 kg/m2 Ideal body weight   25-29.9 kg/m2 Overweight Increased incidence by 20%  30-34.9 kg/m2 Obese (Class I) Increased incidence by 68%  35-39.9 kg/m2  Severe obesity (Class II) Increased incidence by 136%  >40 kg/m2 Extreme obesity (Class III) Increased incidence by 254%   BMI Readings from Last 4 Encounters:  05/12/16 34.95 kg/m  03/25/16 36.34 kg/m  11/01/15 33.76 kg/m  10/18/15 35.61 kg/m   Wt Readings from Last 4 Encounters:  05/12/16 210 lb (95.3 kg)  03/25/16 218 lb 6.4 oz (99.1 kg)  11/01/15 206 lb (93.4 kg)  10/18/15 214 lb (97.1 kg)  Psych/Mental status: Alert, oriented x 3 (person, place, & time)       Eyes: PERLA Respiratory: No evidence of acute respiratory distress  Cervical Spine Exam  Inspection: No masses, redness, or swelling Alignment: Symmetrical Functional ROM: Unrestricted ROM Stability: No instability detected Muscle strength & Tone: Functionally intact Sensory: Unimpaired Palpation:  Non-contributory  Upper Extremity (UE) Exam    Side: Right upper extremity  Side: Left upper extremity  Inspection: No masses, redness, swelling, or asymmetry  Inspection: No masses, redness, swelling, or asymmetry  Functional ROM: Unrestricted ROM          Functional ROM: Unrestricted ROM          Muscle strength & Tone: Functionally intact  Muscle strength & Tone: Functionally intact  Sensory: Unimpaired  Sensory: Unimpaired  Palpation: Non-contributory  Palpation: Non-contributory   Thoracic Spine Exam  Inspection: No masses, redness, or swelling Alignment: Symmetrical Functional ROM: Unrestricted ROM Stability: No instability detected Sensory: Unimpaired Muscle strength & Tone: Functionally intact Palpation: Non-contributory  Lumbar Spine Exam  Inspection: No masses, redness, or swelling Alignment: Symmetrical Functional ROM: Unrestricted ROM Stability: No instability detected Muscle strength & Tone: Functionally intact Sensory: Unimpaired Palpation: Non-contributory Provocative Tests: Lumbar Hyperextension and rotation test: evaluation deferred today       Patrick's Maneuver: evaluation deferred today              Gait & Posture Assessment  Ambulation: Unassisted Gait: Relatively normal for age and body habitus Posture: WNL   Lower Extremity Exam    Side: Right lower extremity  Side: Left lower extremity  Inspection: No masses, redness, swelling, or asymmetry  Inspection: No masses, redness, swelling, or asymmetry  Functional ROM: Unrestricted ROM          Functional ROM: Unrestricted ROM          Muscle strength & Tone: Functionally intact  Muscle strength & Tone: Functionally intact  Sensory: Unimpaired  Sensory: Unimpaired  Palpation: Non-contributory  Palpation: Non-contributory   Assessment  Primary Diagnosis & Pertinent Problem List: The primary encounter diagnosis was Chronic pain syndrome. Diagnoses of Chronic low back pain (Bilateral) (R>L), Chronic lower  extremity pain (Bilateral) (R>L), Chronic upper extremity pain, unspecified laterality, Lumbar facet syndrome (Bilateral), Lumbar facet hypertrophy (multilevel), Lumbar foraminal stenosis (L4) (Left), Anterolisthesis (L3 over L4 and L4 over L5), Chronic neck pain, Long term current use of opiate analgesic, Opiate use, and Chronic lumbar radicular pain (Location of Secondary source of pain) (Right) (L5) were also pertinent to this visit.  Visit Diagnosis: 1. Chronic pain syndrome   2. Chronic low back pain (Bilateral) (R>L)   3. Chronic lower extremity pain (Bilateral) (R>L)   4. Chronic upper extremity pain, unspecified laterality   5. Lumbar facet syndrome (Bilateral)   6. Lumbar facet hypertrophy (multilevel)   7. Lumbar foraminal stenosis (L4) (Left)   8. Anterolisthesis (L3 over L4 and L4 over L5)   9. Chronic neck pain   10. Long term current use of opiate analgesic   11. Opiate use  12. Chronic lumbar radicular pain (Location of Secondary source of pain) (Right) (L5)    Plan of Care  Initial treatment plan:  Please be advised that as per protocol, today's visit has been an evaluation only. We have not taken over the patient's controlled substance management.  Problem-specific plan: No problem-specific Assessment & Plan notes found for this encounter.  Ordered Lab-work, Procedure(s), Referral(s), & Consult(s): Orders Placed This Encounter  Procedures  . DG Cervical Spine Complete  . DG Lumbar Spine Complete W/Bend  . Compliance Drug Analysis, Ur  . Comprehensive metabolic panel  . C-reactive protein  . Magnesium  . Sedimentation rate  . Vitamin B12  . 25-Hydroxyvitamin D Lcms D2+D3  . Ambulatory referral to Psychology   Pharmacotherapy: Medications ordered:  No orders of the defined types were placed in this encounter.  Medications administered during this visit: Ms. Colton had no medications administered during this visit.   Pharmacotherapy under consideration:   Opioid Analgesics: The patient was informed that there is no guarantee that she would be a candidate for opioid analgesics. The decision will be made following CDC guidelines. This decision will be based on the results of diagnostic studies, as well as Ms. Debold's risk profile.  Membrane stabilizer: To be determined at a later time Muscle relaxant: To be determined at a later time NSAID: To be determined at a later time Other analgesic(s): To be determined at a later time   Interventional therapies under consideration: Ms. Shaheed was informed that there is no guarantee that she would be a candidate for interventional therapies. The decision will be based on the results of diagnostic studies, as well as Ms. Barajas's risk profile.  Possible procedure(s): Diagnostic right-sided lumbar facet block under fluoroscopic guidance and IV sedation  Possible right-sided lumbar facet RFA  Right L4-5 lumbar epidural steroid injection under fluoroscopic guidance  Right cervical epidural steroid injection under fluoroscopic guidance    Provider-requested follow-up: Return for 2nd Visit, after [facility] evaluation.  No future appointments.  Primary Care Physician: [facility] Location: [facility] Note by: Kathlen Brunswick. Dossie Arbour, M.D, DABA, DABAPM, DABPM, DABIPP, FIPP Date: 05/12/2016; Time: 3:07 PM  Pain Score Disclaimer: We use the NRS-11 scale. This is a self-reported, subjective measurement of pain severity with only modest accuracy. It is used primarily to identify changes within a particular patient. It must be understood that outpatient pain scales are significantly less accurate that those used for research, where they can be applied under ideal controlled circumstances with minimal exposure to variables. In reality, the score is likely to be a combination of pain intensity and pain affect, where pain affect describes the degree of emotional  arousal or changes in action readiness caused by the sensory experience of pain. Factors such as social and work situation, setting, emotional state, anxiety levels, expectation, and prior pain experience may influence pain perception and show large inter-individual differences that may also be affected by time variables.  Patient instructions provided during this appointment: Patient Instructions  Pain Management Discharge Instructions  General Discharge Instructions :  If you need to reach your doctor call: Monday-Friday 8:00 am - 4:00 pm at [phone] or toll free [phone].  After clinic hours [phone] to have operator reach doctor.  Bring all of your medication bottles to all your appointments in the pain clinic.  To cancel or reschedule your appointment with Pain Management please remember to call 24 hours in advance to avoid a fee.  Refer  to the educational materials which you have been given on: General Risks, I had my Procedure. Discharge Instructions, Post Sedation.  Post Procedure Instructions:  The drugs you were given will stay in your system until tomorrow, so for the next 24 hours you should not drive, make any legal decisions or drink any alcoholic beverages.  You may eat anything you prefer, but it is better to start with liquids then soups and crackers, and gradually work up to solid foods.  Please notify your doctor immediately if you have any unusual bleeding, trouble breathing or pain that is not related to your normal pain.  Depending on the type of procedure that was done, some parts of your body may feel week and/or numb.  This usually clears up by tonight or the next day.  Walk with the use of an assistive device or accompanied by an adult for the 24 hours.  You may use ice on the affected area for the first 24 hours.  Put ice in a Ziploc bag and cover with a towel and place against area 15 minutes on 15 minutes off.  You may switch to heat after 24  hours.

## 2016-05-12 ENCOUNTER — Ambulatory Visit: Payer: Medicare HMO | Attending: Pain Medicine | Admitting: Pain Medicine

## 2016-05-12 ENCOUNTER — Other Ambulatory Visit
Admission: RE | Admit: 2016-05-12 | Discharge: 2016-05-12 | Disposition: A | Discharge status: Home / Self Care | Payer: Medicare HMO | Source: Ambulatory Visit | Attending: Pain Medicine | Admitting: Pain Medicine

## 2016-05-12 ENCOUNTER — Encounter: Payer: Self-pay | Admitting: Pain Medicine

## 2016-05-12 ENCOUNTER — Ambulatory Visit
Admission: RE | Admit: 2016-05-12 | Discharge: 2016-05-12 | Disposition: A | Discharge status: Home / Self Care | Payer: Medicare HMO | Source: Ambulatory Visit | Attending: Pain Medicine | Admitting: Pain Medicine

## 2016-05-12 VITALS — BP 118/78 | HR 83 | Temp 98.4°F | Resp 16 | Ht 65.0 in | Wt 210.0 lb

## 2016-05-12 DIAGNOSIS — K219 Gastro-esophageal reflux disease without esophagitis: Secondary | ICD-10-CM | POA: Diagnosis not present

## 2016-05-12 DIAGNOSIS — M5116 Intervertebral disc disorders with radiculopathy, lumbar region: Secondary | ICD-10-CM | POA: Diagnosis not present

## 2016-05-12 DIAGNOSIS — M1288 Other specific arthropathies, not elsewhere classified, other specified site: Secondary | ICD-10-CM

## 2016-05-12 DIAGNOSIS — M9983 Other biomechanical lesions of lumbar region: Secondary | ICD-10-CM | POA: Insufficient documentation

## 2016-05-12 DIAGNOSIS — M4802 Spinal stenosis, cervical region: Secondary | ICD-10-CM | POA: Insufficient documentation

## 2016-05-12 DIAGNOSIS — H538 Other visual disturbances: Secondary | ICD-10-CM | POA: Insufficient documentation

## 2016-05-12 DIAGNOSIS — K589 Irritable bowel syndrome without diarrhea: Secondary | ICD-10-CM | POA: Diagnosis not present

## 2016-05-12 DIAGNOSIS — M79605 Pain in left leg: Secondary | ICD-10-CM

## 2016-05-12 DIAGNOSIS — G8929 Other chronic pain: Secondary | ICD-10-CM

## 2016-05-12 DIAGNOSIS — M47896 Other spondylosis, lumbar region: Secondary | ICD-10-CM | POA: Diagnosis not present

## 2016-05-12 DIAGNOSIS — M4316 Spondylolisthesis, lumbar region: Secondary | ICD-10-CM | POA: Insufficient documentation

## 2016-05-12 DIAGNOSIS — Z79899 Other long term (current) drug therapy: Secondary | ICD-10-CM | POA: Insufficient documentation

## 2016-05-12 DIAGNOSIS — M5442 Lumbago with sciatica, left side: Secondary | ICD-10-CM

## 2016-05-12 DIAGNOSIS — M79603 Pain in arm, unspecified: Secondary | ICD-10-CM | POA: Insufficient documentation

## 2016-05-12 DIAGNOSIS — F17210 Nicotine dependence, cigarettes, uncomplicated: Secondary | ICD-10-CM | POA: Insufficient documentation

## 2016-05-12 DIAGNOSIS — M50322 Other cervical disc degeneration at C5-C6 level: Secondary | ICD-10-CM | POA: Insufficient documentation

## 2016-05-12 DIAGNOSIS — M79601 Pain in right arm: Secondary | ICD-10-CM | POA: Diagnosis not present

## 2016-05-12 DIAGNOSIS — M5410 Radiculopathy, site unspecified: Secondary | ICD-10-CM

## 2016-05-12 DIAGNOSIS — M79604 Pain in right leg: Secondary | ICD-10-CM

## 2016-05-12 DIAGNOSIS — R110 Nausea: Secondary | ICD-10-CM | POA: Diagnosis not present

## 2016-05-12 DIAGNOSIS — Z79891 Long term (current) use of opiate analgesic: Secondary | ICD-10-CM

## 2016-05-12 DIAGNOSIS — M48061 Spinal stenosis, lumbar region without neurogenic claudication: Secondary | ICD-10-CM | POA: Insufficient documentation

## 2016-05-12 DIAGNOSIS — M542 Cervicalgia: Secondary | ICD-10-CM | POA: Diagnosis not present

## 2016-05-12 DIAGNOSIS — G44229 Chronic tension-type headache, not intractable: Secondary | ICD-10-CM | POA: Diagnosis not present

## 2016-05-12 DIAGNOSIS — M5137 Other intervertebral disc degeneration, lumbosacral region: Secondary | ICD-10-CM | POA: Diagnosis not present

## 2016-05-12 DIAGNOSIS — G894 Chronic pain syndrome: Secondary | ICD-10-CM | POA: Insufficient documentation

## 2016-05-12 DIAGNOSIS — M79602 Pain in left arm: Secondary | ICD-10-CM | POA: Diagnosis not present

## 2016-05-12 DIAGNOSIS — R531 Weakness: Secondary | ICD-10-CM | POA: Diagnosis not present

## 2016-05-12 DIAGNOSIS — M4310 Spondylolisthesis, site unspecified: Secondary | ICD-10-CM | POA: Insufficient documentation

## 2016-05-12 DIAGNOSIS — Z9049 Acquired absence of other specified parts of digestive tract: Secondary | ICD-10-CM | POA: Insufficient documentation

## 2016-05-12 DIAGNOSIS — M5441 Lumbago with sciatica, right side: Secondary | ICD-10-CM | POA: Insufficient documentation

## 2016-05-12 DIAGNOSIS — R200 Anesthesia of skin: Secondary | ICD-10-CM | POA: Insufficient documentation

## 2016-05-12 DIAGNOSIS — R1310 Dysphagia, unspecified: Secondary | ICD-10-CM | POA: Diagnosis not present

## 2016-05-12 DIAGNOSIS — F1190 Opioid use, unspecified, uncomplicated: Secondary | ICD-10-CM

## 2016-05-12 DIAGNOSIS — M47816 Spondylosis without myelopathy or radiculopathy, lumbar region: Secondary | ICD-10-CM

## 2016-05-12 DIAGNOSIS — K449 Diaphragmatic hernia without obstruction or gangrene: Secondary | ICD-10-CM | POA: Diagnosis not present

## 2016-05-12 DIAGNOSIS — E7800 Pure hypercholesterolemia, unspecified: Secondary | ICD-10-CM | POA: Diagnosis not present

## 2016-05-12 DIAGNOSIS — K5730 Diverticulosis of large intestine without perforation or abscess without bleeding: Secondary | ICD-10-CM | POA: Insufficient documentation

## 2016-05-12 DIAGNOSIS — Z9889 Other specified postprocedural states: Secondary | ICD-10-CM | POA: Insufficient documentation

## 2016-05-12 LAB — COMPREHENSIVE METABOLIC PANEL
ALBUMIN: 4.4 g/dL (ref 3.5–5.0)
ALK PHOS: 71 U/L (ref 38–126)
ALT: 16 U/L (ref 14–54)
ANION GAP: 6 (ref 5–15)
AST: 19 U/L (ref 15–41)
BILIRUBIN TOTAL: 0.4 mg/dL (ref 0.3–1.2)
BUN: 22 mg/dL — AB (ref 6–20)
CALCIUM: 9 mg/dL (ref 8.9–10.3)
CO2: 30 mmol/L (ref 22–32)
CREATININE: 0.78 mg/dL (ref 0.44–1.00)
Chloride: 102 mmol/L (ref 101–111)
GFR calc Af Amer: >60 mL/min (ref >60)
GFR calc non Af Amer: >60 mL/min (ref >60)
GLUCOSE: 97 mg/dL (ref 65–99)
Potassium: 3.8 mmol/L (ref 3.5–5.1)
SODIUM: 138 mmol/L (ref 135–145)
TOTAL PROTEIN: 7.4 g/dL (ref 6.5–8.1)

## 2016-05-12 LAB — C-REACTIVE PROTEIN: CRP: <0.8 mg/dL (ref <1.0)

## 2016-05-12 LAB — VITAMIN B12: Vitamin B-12: 381 pg/mL (ref 180–914)

## 2016-05-12 LAB — MAGNESIUM: Magnesium: 2 mg/dL (ref 1.7–2.4)

## 2016-05-12 LAB — SEDIMENTATION RATE: SED RATE: 4 mm/h (ref 0–30)

## 2016-05-12 NOTE — Patient Instructions (Signed)

## 2016-05-15 LAB — 25-HYDROXY VITAMIN D LCMS D2+D3
25-Hydroxy, Vitamin D-2: 51 ng/mL
25-Hydroxy, Vitamin D: 55 ng/mL

## 2016-05-15 LAB — 25-HYDROXYVITAMIN D LCMS D2+D3: 25-HYDROXY, VITAMIN D-3: 3.7 ng/mL

## 2016-05-17 LAB — COMPLIANCE DRUG ANALYSIS, UR

## 2016-05-18 ENCOUNTER — Other Ambulatory Visit: Payer: Self-pay | Admitting: Obstetrics and Gynecology

## 2016-05-26 ENCOUNTER — Encounter: Payer: Self-pay | Admitting: Pain Medicine

## 2016-05-26 DIAGNOSIS — F1290 Cannabis use, unspecified, uncomplicated: Secondary | ICD-10-CM | POA: Insufficient documentation

## 2016-05-26 NOTE — Progress Notes (Signed)
NOTE: This forensic urine drug screen (UDS) test was conducted using a state-of-the-art ultra high performance liquid chromatography and mass spectrometry system (UPLC/MS-MS), the most sophisticated and accurate method available. UPLC/MS-MS is 1,000 times more precise and accurate than standard gas chromatography and mass spectrometry (GC/MS). This system can analyze 26 drug categories and 180 drug compounds.  Positive, undisclosed use of illicit substance (Cannabinoids). Our program has a "Zero Tolerance" for the use of illicit substances. As a consequence of the results obtained on this test, we will no longer offer controlled substances as a therapeutic option for this patient.

## 2016-06-30 ENCOUNTER — Other Ambulatory Visit: Payer: Self-pay | Admitting: Obstetrics and Gynecology

## 2016-07-27 ENCOUNTER — Ambulatory Visit: Payer: Medicare HMO | Attending: Pain Medicine | Admitting: Pain Medicine

## 2016-07-27 ENCOUNTER — Encounter: Payer: Self-pay | Admitting: Pain Medicine

## 2016-07-27 VITALS — BP 101/76 | HR 93 | Temp 98.4°F | Resp 18 | Ht 65.0 in | Wt 218.0 lb

## 2016-07-27 DIAGNOSIS — F17210 Nicotine dependence, cigarettes, uncomplicated: Secondary | ICD-10-CM | POA: Insufficient documentation

## 2016-07-27 DIAGNOSIS — M792 Neuralgia and neuritis, unspecified: Secondary | ICD-10-CM | POA: Diagnosis not present

## 2016-07-27 DIAGNOSIS — K5730 Diverticulosis of large intestine without perforation or abscess without bleeding: Secondary | ICD-10-CM | POA: Diagnosis not present

## 2016-07-27 DIAGNOSIS — K219 Gastro-esophageal reflux disease without esophagitis: Secondary | ICD-10-CM | POA: Diagnosis not present

## 2016-07-27 DIAGNOSIS — Z79891 Long term (current) use of opiate analgesic: Secondary | ICD-10-CM

## 2016-07-27 DIAGNOSIS — M79604 Pain in right leg: Secondary | ICD-10-CM | POA: Diagnosis not present

## 2016-07-27 DIAGNOSIS — M5116 Intervertebral disc disorders with radiculopathy, lumbar region: Secondary | ICD-10-CM | POA: Insufficient documentation

## 2016-07-27 DIAGNOSIS — G894 Chronic pain syndrome: Secondary | ICD-10-CM | POA: Insufficient documentation

## 2016-07-27 DIAGNOSIS — M5442 Lumbago with sciatica, left side: Secondary | ICD-10-CM | POA: Diagnosis not present

## 2016-07-27 DIAGNOSIS — Z9049 Acquired absence of other specified parts of digestive tract: Secondary | ICD-10-CM | POA: Insufficient documentation

## 2016-07-27 DIAGNOSIS — M47896 Other spondylosis, lumbar region: Secondary | ICD-10-CM | POA: Diagnosis not present

## 2016-07-27 DIAGNOSIS — M5410 Radiculopathy, site unspecified: Secondary | ICD-10-CM

## 2016-07-27 DIAGNOSIS — E559 Vitamin D deficiency, unspecified: Secondary | ICD-10-CM | POA: Diagnosis not present

## 2016-07-27 DIAGNOSIS — Z9889 Other specified postprocedural states: Secondary | ICD-10-CM | POA: Diagnosis not present

## 2016-07-27 DIAGNOSIS — M47816 Spondylosis without myelopathy or radiculopathy, lumbar region: Secondary | ICD-10-CM

## 2016-07-27 DIAGNOSIS — M1288 Other specific arthropathies, not elsewhere classified, other specified site: Secondary | ICD-10-CM

## 2016-07-27 DIAGNOSIS — M5117 Intervertebral disc disorders with radiculopathy, lumbosacral region: Secondary | ICD-10-CM | POA: Diagnosis not present

## 2016-07-27 DIAGNOSIS — R1310 Dysphagia, unspecified: Secondary | ICD-10-CM | POA: Diagnosis not present

## 2016-07-27 DIAGNOSIS — K589 Irritable bowel syndrome without diarrhea: Secondary | ICD-10-CM | POA: Diagnosis not present

## 2016-07-27 DIAGNOSIS — Z885 Allergy status to narcotic agent status: Secondary | ICD-10-CM | POA: Diagnosis not present

## 2016-07-27 DIAGNOSIS — M159 Polyosteoarthritis, unspecified: Secondary | ICD-10-CM | POA: Insufficient documentation

## 2016-07-27 DIAGNOSIS — G8929 Other chronic pain: Secondary | ICD-10-CM

## 2016-07-27 DIAGNOSIS — E7800 Pure hypercholesterolemia, unspecified: Secondary | ICD-10-CM | POA: Diagnosis not present

## 2016-07-27 DIAGNOSIS — F1190 Opioid use, unspecified, uncomplicated: Secondary | ICD-10-CM | POA: Diagnosis not present

## 2016-07-27 DIAGNOSIS — M150 Primary generalized (osteo)arthritis: Secondary | ICD-10-CM | POA: Diagnosis not present

## 2016-07-27 DIAGNOSIS — M5441 Lumbago with sciatica, right side: Secondary | ICD-10-CM

## 2016-07-27 DIAGNOSIS — Z8249 Family history of ischemic heart disease and other diseases of the circulatory system: Secondary | ICD-10-CM | POA: Diagnosis not present

## 2016-07-27 DIAGNOSIS — M542 Cervicalgia: Secondary | ICD-10-CM

## 2016-07-27 DIAGNOSIS — Z800 Family history of malignant neoplasm of digestive organs: Secondary | ICD-10-CM | POA: Diagnosis not present

## 2016-07-27 DIAGNOSIS — M4310 Spondylolisthesis, site unspecified: Secondary | ICD-10-CM | POA: Diagnosis not present

## 2016-07-27 DIAGNOSIS — F1290 Cannabis use, unspecified, uncomplicated: Secondary | ICD-10-CM | POA: Diagnosis not present

## 2016-07-27 DIAGNOSIS — M4316 Spondylolisthesis, lumbar region: Secondary | ICD-10-CM | POA: Insufficient documentation

## 2016-07-27 DIAGNOSIS — Z79899 Other long term (current) drug therapy: Secondary | ICD-10-CM | POA: Insufficient documentation

## 2016-07-27 DIAGNOSIS — Z818 Family history of other mental and behavioral disorders: Secondary | ICD-10-CM | POA: Insufficient documentation

## 2016-07-27 DIAGNOSIS — M791 Myalgia: Secondary | ICD-10-CM | POA: Insufficient documentation

## 2016-07-27 DIAGNOSIS — M48061 Spinal stenosis, lumbar region without neurogenic claudication: Secondary | ICD-10-CM | POA: Insufficient documentation

## 2016-07-27 DIAGNOSIS — M7918 Myalgia, other site: Secondary | ICD-10-CM

## 2016-07-27 DIAGNOSIS — Z888 Allergy status to other drugs, medicaments and biological substances status: Secondary | ICD-10-CM | POA: Insufficient documentation

## 2016-07-27 DIAGNOSIS — Z833 Family history of diabetes mellitus: Secondary | ICD-10-CM | POA: Diagnosis not present

## 2016-07-27 MED ORDER — MELOXICAM 15 MG PO TABS: 15.0000 mg | ORAL_TABLET | Freq: Every day | ORAL | 2 refills | Status: DC

## 2016-07-27 MED ORDER — GABAPENTIN 300 MG PO CAPS: 300.0000 mg | ORAL_CAPSULE | Freq: Three times a day (TID) | ORAL | 2 refills | Status: DC

## 2016-07-27 MED ORDER — CYCLOBENZAPRINE HCL 10 MG PO TABS: 10.0000 mg | ORAL_TABLET | Freq: Three times a day (TID) | ORAL | 0 refills | Status: DC | PRN

## 2016-07-27 MED ORDER — MAGNESIUM OXIDE -MG SUPPLEMENT 500 MG PO CAPS: 1.0000 | ORAL_CAPSULE | Freq: Two times a day (BID) | ORAL | 0 refills | Status: AC

## 2016-07-27 NOTE — Patient Instructions (Addendum)
You have medications to be picked up at your pharmacy.   Pain Score  Introduction: The pain score used by this practice is the Verbal Numerical Rating Scale (VNRS-11). This is an 11-point scale. It is for adults and children 10 years or older. There are significant differences in how the pain score is reported, used, and applied. Forget everything you learned in the past and learn this scoring system.  General Information: The scale should reflect your current level of pain. Unless you are specifically asked for the level of your worst pain, or your average pain. If you are asked for one of these two, then it should be understood that it is over the past 24 hours.  Basic Activities of Daily Living (ADL): Personal hygiene, dressing, eating, transferring, and using restroom.  Instructions: Most patients tend to report their level of pain as a combination of two factors, their physical pain and their psychosocial pain. This last one is also known as "suffering" and it is reflection of how physical pain affects you socially and psychologically. From now on, report them separately. From this point on, when asked to report your pain level, report only your physical pain. Use the following table for reference.  Pain Clinic Pain Levels (0-5/10)  Pain Level Score Description  No Pain 0   Mild pain 1 Nagging, annoying, but does not interfere with basic activities of daily living (ADL). Patients are able to eat, bathe, get dressed, toileting (being able to get on and off the toilet and perform personal hygiene functions), transfer (move in and out of bed or a chair without assistance), and maintain continence (able to control bladder and bowel functions). Blood pressure and heart rate are unaffected. A normal heart rate for a healthy adult ranges from 60 to 100 bpm (beats per minute).   Mild to moderate pain 2 Noticeable and distracting. Impossible to hide from other people. More frequent flare-ups. Still  possible to adapt and function close to normal. It can be very annoying and may have occasional stronger flare-ups. With discipline, patients may get used to it and adapt.   Moderate pain 3 Interferes significantly with activities of daily living (ADL). It becomes difficult to feed, bathe, get dressed, get on and off the toilet or to perform personal hygiene functions. Difficult to get in and out of bed or a chair without assistance. Very distracting. With effort, it can be ignored when deeply involved in activities.   Moderately severe pain 4 Impossible to ignore for more than a few minutes. With effort, patients may still be able to manage work or participate in some social activities. Very difficult to concentrate. Signs of autonomic nervous system discharge are evident: dilated pupils (mydriasis); mild sweating (diaphoresis); sleep interference. Heart rate becomes elevated (>115 bpm). Diastolic blood pressure (lower number) rises above 100 mmHg. Patients find relief in laying down and not moving.   Severe pain 5 Intense and extremely unpleasant. Associated with frowning face and frequent crying. Pain overwhelms the senses.  Ability to do any activity or maintain social relationships becomes significantly limited. Conversation becomes difficult. Pacing back and forth is common, as getting into a comfortable position is nearly impossible. Pain wakes you up from deep sleep. Physical signs will be obvious: pupillary dilation; increased sweating; goosebumps; brisk reflexes; cold, clammy hands and feet; nausea, vomiting or dry heaves; loss of appetite; significant sleep disturbance with inability to fall asleep or to remain asleep. When persistent, significant weight loss is observed due to the  complete loss of appetite and sleep deprivation.  Blood pressure and heart rate becomes significantly elevated. Caution: If elevated blood pressure triggers a pounding headache associated with blurred vision, then the  patient should immediately seek attention at an urgent or emergency care unit, as these may be signs of an impending stroke.    Emergency Department Pain Levels (6-10/10)  Emergency Room Pain 6 Severely limiting. Requires emergency care and should not be seen or managed at an outpatient pain management facility. Communication becomes difficult and requires great effort. Assistance to reach the emergency department may be required. Facial flushing and profuse sweating along with potentially dangerous increases in heart rate and blood pressure will be evident.   Distressing pain 7 Self-care is very difficult. Assistance is required to transport, or use restroom. Assistance to reach the emergency department will be required. Tasks requiring coordination, such as bathing and getting dressed become very difficult.   Disabling pain 8 Self-care is no longer possible. At this level, pain is disabling. The individual is unable to do even the most "basic" activities such as walking, eating, bathing, dressing, transferring to a bed, or toileting. Fine motor skills are lost. It is difficult to think clearly.   Incapacitating pain 9 Pain becomes incapacitating. Thought processing is no longer possible. Difficult to remember your own name. Control of movement and coordination are lost.   The worst pain imaginable 10 At this level, most patients pass out from pain. When this level is reached, collapse of the autonomic nervous system occurs, leading to a sudden drop in blood pressure and heart rate. This in turn results in a temporary and dramatic drop in blood flow to the brain, leading to a loss of consciousness. Fainting is one of the body's self defense mechanisms. Passing out puts the brain in a calmed state and causes it to shut down for a while, in order to begin the healing process.    Summary: 1. Refer to this scale when providing [place] with your pain level. 2. Be accurate and careful when reporting your pain  level. This will help with your care. 3. Over-reporting your pain level will lead to loss of credibility. 4. Even a level of 1/10 means that there is pain and will be treated at our facility. 5. High, inaccurate reporting will be documented as "Symptom Exaggeration", leading to loss of credibility and suspicions of possible secondary gains such as obtaining more narcotics, or wanting to appear disabled, for fraudulent reasons. 6. Only pain levels of 5 or below will be seen at our facility. 7. Pain levels of 6 and above will be sent to the Emergency Department and the appointment cancelled. _____________________________________________________________________________________________  Gabapentin Titration  Medication used: Gabapentin (Generic Name) or Neurontin (Brand Name) 300 mg tablets/capsules  Reasons to stop increasing the dose:  Reason 1: You get good relief of symptoms, in which case there is no need to increase the daily dose any further.    Reason 2: You develop some side effects, such as sleeping all of the time, difficulty concentrating, or becoming disoriented, in which case you need to go down on the dose, to the prior level, where you were not experiencing any side effects. Stay on that dose longer, to allow more time for your body to get use it, before attempting to increase it again.   Reasons to stop increasing the dose: Reason 1: You get good relief of symptoms, in which case there is no need to increase the daily dose any  further.  Reason 2: You develop some side effects, such as sleeping all of the time, difficulty concentrating, or becoming disoriented, in which case you need to go down on the dose, to the prior level, where you were not experiencing any side effects. Stay on that dose longer, to allow more time for your body to get use it, before attempting to increase it again.  Steps to increase medication: Step 1: Start by taking 3 (three) tablet at bedtime, begin  taking 1 (one) tablet at noon with lunch. Stay on this dose x another 7 (seven) days.  Step 2: After 7 (seven) days of taking 3 (three) tablet at bedtime, and 1 (one) tablet at noon, then begin taking 1 (one) tablet in the afternoon with dinner. Stay on this dose x another 7 (seven) days.  Step 3: After 7 (seven) days of taking 3 (three) tablet at bedtime, 1 (one) tablet at noon, and 1 (one) tablet in the afternoon, then begin taking 1 (one) tablet in the morning with breakfast. Stay on this dose x another 7 (seven) days. At this point you should be taking the medicine 4 (four) times a day, or about every 6 (six) hours. This daily regimen of taking the medicine 4 (four) times a day, will be maintained from now on. You should not take any doses any sooner than every 6 (six) hours.  Step 4: After 7 (seven) days of taking 3 (three) tablet at bedtime, 1 (one) tablet at noon, 1 (one) tablet in the afternoon, and 1 (one) tablet in the morning, begin taking 2 (two) tablets at noon with lunch. Stay on this dose x another 7 (seven) days.   Step 5: After 7 (seven) days of taking 3 (three) tablet at bedtime, 2 (two) tablets at noon, 1 (one) tablet in the afternoon, and 1 (one) tablet in the morning, begin taking 2 (two) tablets in the afternoon with dinner. Stay on this dose x another 7 (seven) days.   Step 6: After 7 (seven) days of taking 3 (three) tablet at bedtime, 2 (two) tablets at noon, 2 (two) tablets in the afternoon, and 1 (one) tablet in the morning, begin taking 2 (two) tablets in the morning with breakfast. Stay on this dose x another 7 (seven) days. At this point you should be taking the medicine 4 (four) times a day, or about every 6 (six) hours. This daily regimen of taking the medicine 4 (four) times a day, will be maintained from now on. You should not take any doses any sooner than every 6 (six) hours.  Step 7: After 7 (seven) days of taking 3 (three) tablet at bedtime, 2 (two) tablets at noon, 2  (two) tablets in the afternoon, and 2 (two) tablets in the morning, begin taking 3 (three) tablets at noon with lunch. Stay on this dose x another 7 (seven) days.   Step 8: After 7 (seven) days of taking 3 (three) tablet at bedtime, 3 (three) tablets at noon, 2 (two) tablets in the afternoon, and 2 (two) tablets in the morning, begin taking 3 (three) tablets in the afternoon with dinner. Stay on this dose x another 7 (seven) days.   Step 9: After 7 (seven) days of taking 3 (three) tablet at bedtime, 3 (three) tablets at noon, 3 (three) tablets in the afternoon, and 2 (two) tablet in the morning, begin taking 3 (three) tablets in the morning with breakfast. Stay on this dose x another 7 (seven) days. At this point  you should be taking the medicine 4 (four) times a day, or about every 6 (six) hours. This daily regimen of taking the medicine 4 (four) times a day, will be maintained from now on.   Endpoint: Once you have reached the maximum dose you can tolerate without side-effects, contact your physician so as to evaluate the results of the regimen.   Questions: Feel free to contact us for any questions or problems at [phone]  Preparing for Procedure with Sedation Instructions: . Oral Intake: Do not eat or drink anything for at least 8 hours prior to your procedure. . Transportation: Public transportation is not allowed. Bring an adult driver. The driver must be physically present in our waiting room before any procedure can be started. Marland Kitchen Physical Assistance: Bring an adult physically capable of assisting you, in the event you need help. This adult should keep you company at home for at least 6 hours after the procedure. . Blood Pressure Medicine: Take your blood pressure medicine with a sip of water the morning of the procedure. . Blood thinners:  . Diabetics on insulin: Notify the staff so that you can be scheduled 1st case in the morning. If your diabetes requires high dose insulin, take  only  of your normal insulin dose the morning of the procedure and notify the staff that you have done so. . Preventing infections: Shower with an antibacterial soap the morning of your procedure. . Build-up your immune system: Take 1000 mg of Vitamin C with every meal (3 times a day) the day prior to your procedure. Marland Kitchen Antibiotics: Inform the staff if you have a condition or reason that requires you to take antibiotics before dental procedures. . Pregnancy: If you are pregnant, call and cancel the procedure. . Sickness: If you have a cold, fever, or any active infections, call and cancel the procedure. . Arrival: You must be in the facility at least 30 minutes prior to your scheduled procedure. . Children: Do not bring children with you. . Dress appropriately: Bring dark clothing that you would not mind if they get stained. . Valuables: Do not bring any jewelry or valuables. Procedure appointments are reserved for interventional treatments only. Marland Kitchen No Prescription Refills. . No medication changes will be discussed during procedure appointments. . No disability issues will be discussed.  ____________________________________________________________________________________________  Preparing for Procedure with Sedation Instructions: . Oral Intake: Do not eat or drink anything for at least 8 hours prior to your procedure. . Transportation: Public transportation is not allowed. Bring an adult driver. The driver must be physically present in our waiting room before any procedure can be started. Marland Kitchen Physical Assistance: Bring an adult capable of physically assisting you, in the event you need help. . Blood Pressure Medicine: Take your blood pressure medicine with a sip of water the morning of the procedure. . Insulin: Take only  of your normal insulin dose. . Preventing infections: Shower with an antibacterial soap the morning of your procedure. . Build-up your immune system: Take 1000 mg of Vitamin  C with every meal (3 times a day) the day prior to your procedure. . Pregnancy: If you are pregnant, call and cancel the procedure. . Sickness: If you have a cold, fever, or any active infections, call and cancel the procedure. . Arrival: You must be in the facility at least 30 minutes prior to your scheduled procedure. . Children: Do not bring children with you. . Dress appropriately: Bring dark clothing that you would not mind  if they get stained. . Valuables: Do not bring any jewelry or valuables. Procedure appointments are reserved for interventional treatments only. Marland Kitchen No Prescription Refills. . No medication changes will be discussed during procedure appointments. No disability issues will be discussed.Facet Blocks Patient Information  Description: The facets are joints in the spine between the vertebrae.  Like any joints in the body, facets can become irritated and painful.  Arthritis can also effect the facets.  By injecting steroids and local anesthetic in and around these joints, we can temporarily block the nerve supply to them.  Steroids act directly on irritated nerves and tissues to reduce selling and inflammation which often leads to decreased pain.  Facet blocks may be done anywhere along the spine from the neck to the low back depending upon the location of your pain.   After numbing the skin with local anesthetic (like Novocaine), a small needle is passed onto the facet joints under x-ray guidance.  You may experience a sensation of pressure while this is being done.  The entire block usually lasts about 15-25 minutes.   Conditions which may be treated by facet blocks:  Low back/buttock pain Neck/shoulder pain Certain types of headaches  Preparation for the injection:  Do not eat any solid food or dairy products within 8 hours of your appointment. You may drink clear liquid up to 3 hours before appointment.  Clear liquids include water, black coffee, juice or soda.  No milk  or cream please. You may take your regular medication, including pain medications, with a sip of water before your appointment.  Diabetics should hold regular insulin (if taken separately) and take 1/2 normal NPH dose the morning of the procedure.  Carry some sugar containing items with you to your appointment. A driver must accompany you and be prepared to drive you home after your procedure. Bring all your current medications with you. An IV may be inserted and sedation may be given at the discretion of the physician. A blood pressure cuff, EKG and other monitors will often be applied during the procedure.  Some patients may need to have extra oxygen administered for a short period. You will be asked to provide medical information, including your allergies and medications, prior to the procedure.  We must know immediately if you are taking blood thinners (like Coumadin/Warfarin) or if you are allergic to IV iodine contrast (dye).  We must know if you could possible be pregnant.  Possible side-effects:  Bleeding from needle site Infection (rare, may require surgery) Nerve injury (rare) Numbness & tingling (temporary) Difficulty urinating (rare, temporary) Spinal headache (a headache worse with upright posture) Light-headedness (temporary) Pain at injection site (serveral days) Decreased blood pressure (rare, temporary) Weakness in arm/leg (temporary) Pressure sensation in back/neck (temporary)   Call if you experience:  Fever/chills associated with headache or increased back/neck pain Headache worsened by an upright position New onset, weakness or numbness of an extremity below the injection site Hives or difficulty breathing (go to the emergency room) Inflammation or drainage at the injection site(s) Severe back/neck pain greater than usual New symptoms which are concerning to you  Please note:  Although the local anesthetic injected can often make your back or neck feel good for  several hours after the injection, the pain will likely return. It takes 3-7 days for steroids to work.  You may not notice any pain relief for at least one week.  If effective, we will often do a series of 2-3 injections spaced  3-6 weeks apart to maximally decrease your pain.  After the initial series, you may be a candidate for a more permanent nerve block of the facets.  If you have any questions, please call #[phone] . [facility]

## 2016-07-27 NOTE — Progress Notes (Signed)
Patient's Name: Shelly Hanson  MRN: [id number]  Referring Provider: [facility]  DOB: 1960-05-05  PCP: Antionette Fairy, PA-C  DOS: 07/27/2016  Note by: Kathlen Brunswick. Dossie Arbour, MD  Service setting: Ambulatory outpatient  Specialty: Interventional Pain Management  Location: [facility] (AMB) Pain Management Facility    Patient type: Established   Primary Reason(s) for Visit: Encounter for evaluation before starting new chronic pain management plan of care (Level of risk: moderate) CC: Back Pain (lower)  HPI  Shelly Hanson is a 56 y.o. year old, female patient, who comes today for a follow-up evaluation to review the test results and decide on a treatment plan. She has Dysphagia, unspecified(787.20); High cholesterol; Perimenopausal vasomotor symptoms; Long term current use of opiate analgesic; Long term prescription opiate use; Opiate use; Encounter for therapeutic drug level monitoring; Encounter for pain management planning; Chronic low back pain (Location of Primary Source of Pain) (Midline) (Bilateral) (R>L); Chronic lower extremity pain (Location of Secondary source of pain) (Right); Chronic neck pain (Location of Tertiary source of pain) (Right); Chronic upper extremity pain (Bilateral) (R>L); Diverticulosis of colon; Anterolisthesis (L3 over L4 and L4 over L5); Lumbar facet hypertrophy (multilevel); Lumbar facet syndrome (Bilateral) (R>L); Lumbar foraminal stenosis (L4) (Left); Chronic tension-type headache, not intractable; Chronic pain syndrome; Chronic lumbar radicular pain (Location of Secondary source of pain) (Right) (L5); Marijuana use; Neurogenic pain; Osteoarthritis, multiple joints; and Musculoskeletal pain on her problem list. Her primarily concern today is the Back Pain (lower)  Pain Assessment: Self-Reported Pain Score: 5 /10 Clinically the patient looks like a 2/10 Reported level is inconsistent with clinical observations. Information on the proper use of the pain scale provided  to the patient today Pain Type: Chronic pain Pain Location: Back Pain Orientation: Lower Pain Descriptors / Indicators: Stabbing Pain Frequency: Constant  Shelly Hanson comes in today for a follow-up visit after her initial evaluation on 05/12/2016. Today we went over the results of her tests. These were explained in "Layman's terms". During today's appointment we went over my diagnostic impression, as well as the proposed treatment plan. Because the patient's UDS was positive for THC, we will not be writing for any controlled substances until we have one that shows that she will be committing to stopping the drug permanently. She was also informed that should she fall back into [place] and use marijuana again, we will stop the use of any controlled substances and we will never started them again. She understood and accepted. Because she admits that she has been using since her last visit, we will hold on repeating the UDS today but upon her next visit, we will start testing her again.  In considering the treatment plan options, Shelly Hanson was reminded that I no longer take patients for medication management only. I asked her to let me know if she had no intention of taking advantage of the interventional therapies, so that we could make arrangements to provide this space to someone interested. I also made it clear that undergoing interventional therapies for the purpose of getting pain medications is very inappropriate on the part of a patient, and it will not be tolerated in this practice. This type of behavior would suggest true addiction and therefore it requires referral to an addiction specialist.   Further details on both, my assessment(s), as well as the proposed treatment plan, please see below. Controlled Substance Pharmacotherapy Assessment REMS (Risk Evaluation and Mitigation Strategy)  Analgesic: Tramadol 50 mg 1 tablet by mouth 4  times a day. (200 mg/day) MME/day: 20 mg/day Pill Count:  None expected due to no prior prescriptions written by our practice. Pharmacokinetics: Liberation and absorption (onset of action): WNL Distribution (time to peak effect): WNL Metabolism and excretion (duration of action): WNL         Pharmacodynamics: Desired effects: Analgesia: Shelly Hanson reports >50% benefit. Functional ability: Patient reports that medication allows her to accomplish basic ADLs Clinically meaningful improvement in function (CMIF): Sustained CMIF goals met Perceived effectiveness: Described as relatively effective, allowing for increase in activities of daily living (ADL) Undesirable effects: Side-effects or Adverse reactions: None reported Monitoring: [place] PMP: Online review of the past 43-monthperiod previously conducted. Not applicable at this point since we have not taken over the patient's medication management yet. List of all Serum Drug Screening Test(s):  No results found for: AMPHSCRSER, BARBSCRSER, BENZOSCRSER, COCAINSCRSER, PCPSCRSER, THCSCRSER, OPIATESCRSER, [place] PBonesteelList of all UDS test(s) done:  Lab Results  Component Value Date   SUMMARY FINAL 05/12/2016   Last UDS on record: Summary  Date Value Ref Range Status  05/12/2016 FINAL  Final    Comment:    ==================================================================== TOXASSURE COMP DRUG ANALYSIS,UR ==================================================================== Test                             Result       Flag       Units Drug Present and Declared for Prescription Verification   Gabapentin                     PRESENT      EXPECTED Drug Present not Declared for Prescription Verification   Carboxy-THC                    150          UNEXPECTED ng/mg creat    Carboxy-THC is a metabolite of tetrahydrocannabinol  (THC).    [facility] of [facility] most commonly illicit, but THC is also present    in a scheduled prescription medication. Drug Absent but Declared for Prescription  Verification   Butalbital                     Not Detected UNEXPECTED   Tramadol                       Not Detected UNEXPECTED   Venlafaxine                    Not Detected UNEXPECTED   Acetaminophen                  Not Detected UNEXPECTED    Acetaminophen, as indicated in the declared medication list, is    not always detected even when used as directed. ==================================================================== Test                      Result    Flag   Units      Ref Range   Creatinine              141              mg/dL      >=20 ==================================================================== Declared Medications:  The flagging and interpretation on this report are based on the  following declared medications.  Unexpected results may arise from  inaccuracies in the declared medications.  **  Note: The testing scope of this panel includes these medications:  Butalbital (Butalbital/APAP/Caffeine)  Gabapentin  Tramadol  Venlafaxine  **Note: The testing scope of this panel does not include small to  moderate amounts of these reported medications:  Acetaminophen (Butalbital/APAP/Caffeine)  **Note: The testing scope of this panel does not include following  reported medications:  Caffeine (Butalbital/APAP/Caffeine)  Dicyclomine  Docusate  Estradiol  Fluticasone  Medroxyprogesterone  Meloxicam  Omega-3 Fatty Acids  Omeprazole  Ondansetron  Ranitidine  Rosuvastatin  Vitamin D2 (Ergocalciferol) ==================================================================== For clinical consultation, please call [phone]. ====================================================================    UDS interpretation: Unexpected findings: Undeclared illicit substance detected Medication Assessment Form: Patient introduced to form today Treatment compliance: Treatment may start today if patient agrees with proposed plan. Evaluation of compliance is not applicable at this  point Risk Assessment Profile: Aberrant behavior: use of illicit substances Comorbid factors increasing risk of overdose: See initial evaluation. No additional risks detected today Risk Mitigation Strategies:  Patient opioid safety counseling: Completed today. Counseling provided to patient as per "Patient Counseling Document". Document signed by patient, attesting to counseling and understanding Patient-Prescriber Agreement (PPA): Obtained today  Controlled substance notification to other providers: None required. No opioid therapy  Pharmacologic Plan: Patient is not an appropriate candidate for opioid therapy at this time  Laboratory Chemistry  Inflammation Markers Lab Results  Component Value Date   CRP <0.8 05/12/2016   ESRSEDRATE 4 05/12/2016   (CRP: Acute Phase) (ESR: Chronic Phase) Renal Function Markers Lab Results  Component Value Date   BUN 22 (H) 05/12/2016   CREATININE 0.78 05/12/2016   GFRAA >60 05/12/2016   GFRNONAA >60 05/12/2016   Hepatic Function Markers Lab Results  Component Value Date   AST 19 05/12/2016   ALT 16 05/12/2016   ALBUMIN 4.4 05/12/2016   ALKPHOS 71 05/12/2016   Electrolytes Lab Results  Component Value Date   NA 138 05/12/2016   K 3.8 05/12/2016   CL 102 05/12/2016   CALCIUM 9.0 05/12/2016   MG 2.0 05/12/2016   Neuropathy Markers Lab Results  Component Value Date   VITAMINB12 381 05/12/2016   Bone Pathology Markers Lab Results  Component Value Date   ALKPHOS 71 05/12/2016   25OHVITD1 55 05/12/2016   25OHVITD2 51 05/12/2016   25OHVITD3 3.7 05/12/2016   CALCIUM 9.0 05/12/2016   Coagulation Parameters Lab Results  Component Value Date   PLT 222 11/01/2015   Cardiovascular Markers Lab Results  Component Value Date   HGB 14.3 11/01/2015   HCT 42.9 11/01/2015   Note: Lab results reviewed and explained to patient in Layman's terms.  Recent Diagnostic Imaging Review  Dg Cervical Spine Complete Result Date:  05/12/2016 CLINICAL DATA:  Right-sided neck pain for 6 years, numbness in both the arms EXAM: CERVICAL SPINE - COMPLETE 4+ VIEW COMPARISON:  None. FINDINGS: The cervical vertebrae are normal alignment. There is degenerative disc disease primarily at C5-6 but also to a lesser degree at C6-7 with some loss of disc space and spurring. No prevertebral soft tissue swelling is noted. On oblique views, the there is moderate bilateral foraminal narrowing at C5-6 and C6-7 levels. The odontoid process is intact. The lung apices are clear. IMPRESSION: 1. Degenerative disc disease at C5-6 and C6-7. 2. Moderate foraminal narrowing at C5-6 and C6-7 bilaterally. Electronically Signed   By: Ivar Drape M.D.   On: 05/12/2016 16:33   Dg Lumbar Spine Complete W/bend Result Date: 05/12/2016 CLINICAL DATA:  Low back pain for 6 years, right leg tingling  EXAM: LUMBAR SPINE - COMPLETE WITH BENDING VIEWS COMPARISON:  CT abdomen and pelvis of 07/12/2015 FINDINGS: The lumbar vertebrae are in normal alignment. There is degenerative disc disease at L5-S1 with loss of disc space and sclerosis with spurring. There is also some degenerative change involve the facet joints of L4-5 and L5-S1. No compression deformity is seen. The SI joints are well corticated. Through flexion and extension there is limited range of motion of the lumbar spine with no malalignment. IMPRESSION: 1. Degenerative disc disease at L5-S1. 2. Somewhat limited range of motion through flexion and extension. No malalignment. Electronically Signed   By: Ivar Drape M.D.   On: 05/12/2016 16:35   Cervical Imaging: Cervical DG complete:  Results for orders placed during the hospital encounter of 05/12/16  DG Cervical Spine Complete   Narrative CLINICAL DATA:  Right-sided neck pain for 6 years, numbness in both the arms  EXAM: CERVICAL SPINE - COMPLETE 4+ VIEW  COMPARISON:  None.  FINDINGS: The cervical vertebrae are normal alignment. There is degenerative disc  disease primarily at C5-6 but also to a lesser degree at C6-7 with some loss of disc space and spurring. No prevertebral soft tissue swelling is noted. On oblique views, the there is moderate bilateral foraminal narrowing at C5-6 and C6-7 levels. The odontoid process is intact. The lung apices are clear.  IMPRESSION: 1. Degenerative disc disease at C5-6 and C6-7. 2. Moderate foraminal narrowing at C5-6 and C6-7 bilaterally.   Electronically Signed   By: Ivar Drape M.D.   On: 05/12/2016 16:33    Lumbosacral Imaging: Lumbar MR wo contrast:  Results for orders placed during the hospital encounter of 09/19/14  MR Lumbar Spine Wo Contrast   Narrative CLINICAL DATA:  56 year old female with lumbar back pain radiating to both lower extremities. Right greater than left extremity numbness. Intermittent symptoms for 2 years. Initial encounter.  EXAM: MRI LUMBAR SPINE WITHOUT CONTRAST  TECHNIQUE: Multiplanar, multisequence MR imaging of the lumbar spine was performed. No intravenous contrast was administered.  COMPARISON:  Abdomen radiographs 09/30/2007.  FINDINGS: Normal lumbar segmentation demonstrated on the comparison. Trace anterolisthesis at L3-L4 and L4-L5. Otherwise normal vertebral height and alignment. No marrow edema or evidence of acute osseous abnormality.  Visualized lower thoracic spinal cord is normal with conus medularis at L1.  Visualized abdominal viscera and paraspinal soft tissues are within normal limits.  T11-T12:  Negative.  T12-L1:  Negative.  L1-L2:  Negative.  L2-L3: Mild disc desiccation, disc space loss, and circumferential disc bulge. Mild facet hypertrophy. Trace facet joint fluid. No stenosis.  L3-L4: Trace anterolisthesis. Moderate facet hypertrophy. Trace facet joint fluid. Negative disc. No stenosis.  L4-L5: Trace anterolisthesis. Mild disc desiccation and circumferential disc bulge. Small left foraminal level annular fissure  (series 6, image 29). Moderate facet and ligament flavum hypertrophy. Mild left L4 foraminal stenosis.  L5-S1: Disc space loss. Circumferential (mostly far lateral and anterior) disc osteophyte complex. Chronic degenerative endplate changes. No stenosis.  IMPRESSION: 1. Advanced appearing chronic disc degeneration at L5-S1, but without associated stenosis. 2. Otherwise the dominant finding is moderate lumbar facet arthropathy at L3-L4 and L4-L5 in this setting of trace anterolisthesis. Still, no significant spinal stenosis but there is mild left L4 foraminal stenosis.   Electronically Signed   By: Genevie Ann M.D.   On: 09/19/2014 15:57    Lumbar DG Bending views:  Results for orders placed during the hospital encounter of 05/12/16  DG Lumbar Spine Complete W/Bend   Narrative CLINICAL  DATA:  Low back pain for 6 years, right leg tingling  EXAM: LUMBAR SPINE - COMPLETE WITH BENDING VIEWS  COMPARISON:  CT abdomen and pelvis of 07/12/2015  FINDINGS: The lumbar vertebrae are in normal alignment. There is degenerative disc disease at L5-S1 with loss of disc space and sclerosis with spurring. There is also some degenerative change involve the facet joints of L4-5 and L5-S1. No compression deformity is seen. The SI joints are well corticated.  Through flexion and extension there is limited range of motion of the lumbar spine with no malalignment.  IMPRESSION: 1. Degenerative disc disease at L5-S1. 2. Somewhat limited range of motion through flexion and extension. No malalignment.   Electronically Signed   By: Ivar Drape M.D.   On: 05/12/2016 16:35    Note: Results of ordered imaging test(s) reviewed and explained to patient in Layman's terms. Copy of results provided to patient  Meds  The patient has a current medication list which includes the following prescription(s): butalbital-apap-caffeine, calcium 1200, cyclobenzaprine, dicyclomine, docusate sodium, estradiol,  fluticasone, gabapentin, magnesium oxide, medroxyprogesterone, meloxicam, omeprazole, ondansetron, ranitidine, rosuvastatin, tramadol, and vitamin d (ergocalciferol).  Current Outpatient Prescriptions on File Prior to Visit  Medication Sig  . Butalbital-APAP-Caffeine (CAPACET) 50-325-40 MG capsule Take 1 capsule by mouth every 6 (six) hours as needed for pain or headache.  . dicyclomine (BENTYL) 10 MG capsule TAKE 1 CAPSULE FOUR TIMES DAILY BEFORE MEALS  AND AT BEDTIME  . docusate sodium (COLACE) 100 MG capsule Take 100 mg by mouth daily.  Marland Kitchen estradiol (ESTRACE) 0.5 MG tablet Take 1 tablet (0.5 mg total) by mouth daily.  . fluticasone (VERAMYST) 27.5 MCG/SPRAY nasal spray Place 2 sprays into the nose daily.  . medroxyPROGESTERone (PROVERA) 5 MG tablet TAKE 1 TABLET ONCE DAILY FOR 2 WEEKS. REPEAT AS DIRECTED EVERY THIRD MONTH.  Marland Kitchen omeprazole (PRILOSEC) 40 MG capsule Take 40 mg by mouth daily.  . ondansetron (ZOFRAN ODT) 4 MG disintegrating tablet Take 1 tablet (4 mg total) by mouth every 6 (six) hours as needed for nausea.  . ranitidine (ZANTAC) 150 MG tablet Take 150 mg by mouth 2 (two) times daily.  . rosuvastatin (CRESTOR) 20 MG tablet Take 20 mg by mouth daily.  . traMADol (ULTRAM) 50 MG tablet TAKE 1 TABLET EVERY 6 HOURS AS NEEDED FOR MODERATE OR SEVERE PAIN  . Vitamin D, Ergocalciferol, (DRISDOL) 50000 UNITS CAPS capsule Take 50,000 Units by mouth every 7 (seven) days. Takes on Saturdays.   No current facility-administered medications on file prior to visit.    ROS  Constitutional: Denies any fever or chills Gastrointestinal: No reported hemesis, hematochezia, vomiting, or acute GI distress Musculoskeletal: Denies any acute onset joint swelling, redness, loss of ROM, or weakness Neurological: No reported episodes of acute onset apraxia, aphasia, dysarthria, agnosia, amnesia, paralysis, loss of coordination, or loss of consciousness  Allergies  Ms. Flamm is allergic to codeine; flagyl  [metronidazole]; naproxen; and sulfa antibiotics.  [place]  Drug: Ms. Achey  reports that she does not use drugs. Alcohol:  reports that she does not drink alcohol. Tobacco:  reports that she has been smoking Cigarettes.  She has a 2.50 pack-year smoking history. She has never used smokeless tobacco. Medical:  has a past medical history of Abdominal wall pain in right lower quadrant (09/04/2014); GERD (gastroesophageal reflux disease); High cholesterol; Hyperlipemia; IBS (irritable bowel syndrome); Migraine headache (12/18/2014); Pinched nerve; Pinched nerve in neck; Sciatic leg pain; and Vitamin D deficiency disease. Family: family history includes Cancer in  her father; Colon cancer in her father; Depression in her mother; Diabetes in her mother; Heart disease in her brother; Hyperlipidemia in her brother, brother, and mother; Hypertension in her father; Seizures in her father.  Past Surgical History:  Procedure Laterality Date  . BALLOON DILATION N/A 11/17/2013   Procedure: BALLOON DILATION;  Surgeon: Rogene Houston, MD;  [facility];  Service: Endoscopy;  Laterality: N/A;  . CHOLECYSTECTOMY    . COLONOSCOPY N/A 11/17/2013   Procedure: COLONOSCOPY;  Surgeon: Rogene Houston, MD;  Location: [facility];  Service: Endoscopy;  Laterality: N/A;  1030  . DILATION AND CURETTAGE OF UTERUS     x 2   . ESOPHAGOGASTRODUODENOSCOPY N/A 11/17/2013   Procedure: ESOPHAGOGASTRODUODENOSCOPY (EGD);  Surgeon: Rogene Houston, MD;  [facility]: [facility];  Service: Endoscopy;  Laterality: N/A;  . FOOT SURGERY     for a fx.   Marland Kitchen [name] N/A 11/17/2013   Procedure: Venia Minks DILATION;  Surgeon: Rogene Houston, MD;  [facility]: [facility];  Service: Endoscopy;  Laterality: N/A;  . SAVORY DILATION N/A 11/17/2013   Procedure: SAVORY DILATION;  Surgeon: Rogene Houston, MD;  [facility];  Service: Endoscopy;  Laterality: N/A;   Constitutional Exam  General appearance: Well nourished,  well developed, and well hydrated. In no apparent acute distress Vitals:   07/27/16 1359  BP: 101/76  Pulse: 93  Resp: 18  Temp: 98.4 F (36.9 C)  TempSrc: Oral  SpO2: 98%  Weight: 218 lb (98.9 kg)  Height: 5' 5" (1.651 m)   BMI Assessment: Estimated body mass index is 36.28 kg/m as calculated from the following:   Height as of this encounter: 5' 5" (1.651 m).   Weight as of this encounter: 218 lb (98.9 kg).  BMI interpretation table: BMI level Category Range association with higher incidence of chronic pain  <18 kg/m2 Underweight   18.5-24.9 kg/m2 Ideal body weight   25-29.9 kg/m2 Overweight Increased incidence by 20%  30-34.9 kg/m2 Obese (Class I) Increased incidence by 68%  35-39.9 kg/m2 Severe obesity (Class II) Increased incidence by 136%  >40 kg/m2 Extreme obesity (Class III) Increased incidence by 254%   BMI Readings from Last 4 Encounters:  07/27/16 36.28 kg/m  05/12/16 34.95 kg/m  03/25/16 36.34 kg/m  11/01/15 33.76 kg/m   Wt Readings from Last 4 Encounters:  07/27/16 218 lb (98.9 kg)  05/12/16 210 lb (95.3 kg)  03/25/16 218 lb 6.4 oz (99.1 kg)  11/01/15 206 lb (93.4 kg)  Psych/Mental status: Alert, oriented x 3 (person, place, & time)       Eyes: PERLA Respiratory: No evidence of acute respiratory distress  Cervical Spine Exam  Inspection: No masses, redness, or swelling Alignment: Symmetrical Functional ROM: Unrestricted ROM Stability: No instability detected Muscle strength & Tone: Functionally intact Sensory: Unimpaired Palpation: No palpable anomalies  Upper Extremity (UE) Exam    Side: Right upper extremity  Side: Left upper extremity  Inspection: No masses, redness, swelling, or asymmetry. No contractures  Inspection: No masses, redness, swelling, or asymmetry. No contractures  Functional ROM: Unrestricted ROM          Functional ROM: Unrestricted ROM          Muscle strength & Tone: Functionally intact  Muscle strength & Tone: Functionally  intact  Sensory: Unimpaired  Sensory: Unimpaired  Palpation: No palpable anomalies  Palpation: No palpable anomalies  Specialized Test(s): Deferred         Specialized  Test(s): Deferred          Thoracic Spine Exam  Inspection: No masses, redness, or swelling Alignment: Symmetrical Functional ROM: Unrestricted ROM Stability: No instability detected Sensory: Unimpaired Muscle strength & Tone: No palpable anomalies  Lumbar Spine Exam  Inspection: No masses, redness, or swelling Alignment: Symmetrical Functional ROM: Limited ROM Stability: No instability detected Muscle strength & Tone: Functionally intact Sensory: Movement-associated pain Palpation: Complains of area being tender to palpation Provocative Tests: Lumbar Hyperextension and rotation test: Positive bilaterally for facet joint pain. Patrick's Maneuver: evaluation deferred today              Gait & Posture Assessment  Ambulation: Unassisted Gait: Relatively normal for age and body habitus Posture: WNL   Lower Extremity Exam    Side: Right lower extremity  Side: Left lower extremity  Inspection: No masses, redness, swelling, or asymmetry. No contractures  Inspection: No masses, redness, swelling, or asymmetry. No contractures  Functional ROM: Unrestricted ROM          Functional ROM: Unrestricted ROM          Muscle strength & Tone: Functionally intact  Muscle strength & Tone: Functionally intact  Sensory: Unimpaired  Sensory: Unimpaired  Palpation: No palpable anomalies  Palpation: No palpable anomalies   Assessment & Plan  Primary Diagnosis & Pertinent Problem List: The primary encounter diagnosis was Chronic low back pain (Location of Primary Source of Pain) (Midline) (Bilateral) (R>L). Diagnoses of Chronic lower extremity pain (Location of Secondary source of pain) (Right), Chronic lumbar radicular pain (Location of Secondary source of pain) (Right) (L5), Chronic neck pain (Location of Tertiary source of pain)  (Right), Lumbar facet syndrome (Bilateral) (R>L), Lumbar facet hypertrophy (multilevel), Chronic pain syndrome, Anterolisthesis (L3 over L4 and L4 over L5), Long term prescription opiate use, Opiate use, Neurogenic pain, Osteoarthritis, multiple joints, Musculoskeletal pain, and Marijuana use were also pertinent to this visit.  Visit Diagnosis: 1. Chronic low back pain (Location of Primary Source of Pain) (Midline) (Bilateral) (R>L)   2. Chronic lower extremity pain (Location of Secondary source of pain) (Right)   3. Chronic lumbar radicular pain (Location of Secondary source of pain) (Right) (L5)   4. Chronic neck pain (Location of Tertiary source of pain) (Right)   5. Lumbar facet syndrome (Bilateral) (R>L)   6. Lumbar facet hypertrophy (multilevel)   7. Chronic pain syndrome   8. Anterolisthesis (L3 over L4 and L4 over L5)   9. Long term prescription opiate use   10. Opiate use   11. Neurogenic pain   12. Osteoarthritis, multiple joints   13. Musculoskeletal pain   14. Marijuana use    Problems updated and reviewed during this visit: Problem  Neurogenic Pain  Osteoarthritis, multiple joints  Musculoskeletal Pain   Problem-specific Plan(s): No problem-specific Assessment & Plan notes found for this encounter.  Assessment & plan notes cannot be loaded without a specified hospital service.  Plan of Care  Pharmacotherapy (Medications Ordered): Meds ordered this encounter  Medications  . gabapentin (NEURONTIN) 300 MG capsule    Sig: Take 1-3 capsules (300-900 mg total) by mouth 3 (three) times daily. Follow titration schedule.    Dispense:  270 capsule    Refill:  2    Do not place medication on "Automatic Refill". Fill one day early if pharmacy is closed on scheduled refill date.  . meloxicam (MOBIC) 15 MG tablet    Sig: Take 1 tablet (15 mg total) by mouth daily.    Dispense:  30 tablet    Refill:  2    Do not place medication on "Automatic Refill". Fill one day early if  pharmacy is closed on scheduled refill date.  . Magnesium Oxide 500 MG CAPS    Sig: Take 1 capsule (500 mg total) by mouth 2 (two) times daily at 8 am and 10 pm.    Dispense:  30 capsule    Refill:  0    Do not add to the "Automatic Refill" notification system.  . cyclobenzaprine (FLEXERIL) 10 MG tablet    Sig: Take 1 tablet (10 mg total) by mouth 3 (three) times daily as needed for muscle spasms.    Dispense:  90 tablet    Refill:  0    Do not place this medication, or any other prescription from our practice, on "Automatic Refill". Patient may have prescription filled one day early if pharmacy is closed on scheduled refill date.   Lab-work, procedure(s), and/or referral(s): Orders Placed This Encounter  Procedures  . LUMBAR FACET(MEDIAL BRANCH NERVE BLOCK) MBNB    Pharmacotherapy: Opioid Analgesics: We'll take over management today. See above orders Membrane stabilizer: We have discussed the possibility of optimizing this mode of therapy, if tolerated Muscle relaxant: We have discussed the possibility of a trial NSAID: We have discussed the possibility of a trial Other analgesic(s): To be determined at a later time   Interventional therapies: Planned, scheduled, and/or pending:    Diagnostic bilateral lumbar facet block under fluoroscopic guidance and IV sedation    Considering:   Diagnostic right-sided lumbar facet block under fluoroscopic guidance and IV sedation  Possible right-sided lumbar facet RFA  Right L4-5 lumbar epidural steroid injection under fluoroscopic guidance  Right cervical epidural steroid injection under fluoroscopic guidance    PRN Procedures:   To be determined at a later time   Provider-requested follow-up: Return for procedure (ASAP).  No future appointments.  Primary Care Physician: Antionette Fairy, PA-C Location: [facility] Note by: Kathlen Brunswick. Dossie Arbour, M.D, DABA, DABAPM, DABPM, DABIPP, FIPP Date: 07/27/2016; Time:  4:51 PM  Pain Score Disclaimer: We use the NRS-11 scale. This is a self-reported, subjective measurement of pain severity with only modest accuracy. It is used primarily to identify changes within a particular patient. It must be understood that outpatient pain scales are significantly less accurate that those used for research, where they can be applied under ideal controlled circumstances with minimal exposure to variables. In reality, the score is likely to be a combination of pain intensity and pain affect, where pain affect describes the degree of emotional arousal or changes in action readiness caused by the sensory experience of pain. Factors such as social and work situation, setting, emotional state, anxiety levels, expectation, and prior pain experience may influence pain perception and show large inter-individual differences that may also be affected by time variables.  Patient instructions provided during this appointment: Patient Instructions   You have medications to be picked up at your pharmacy.   Pain Score  Introduction: The pain score used by this practice is the Verbal Numerical Rating Scale (VNRS-11). This is an 11-point scale. It is for adults and children 10 years or older. There are significant differences in how the pain score is reported, used, and applied. Forget everything you learned in the past and learn this scoring system.  General Information: The scale should reflect your current level of pain. Unless you are specifically asked for the level of your worst pain, or  your average pain. If you are asked for one of these two, then it should be understood that it is over the past 24 hours.  Basic Activities of Daily Living (ADL): Personal hygiene, dressing, eating, transferring, and using restroom.  Instructions: Most patients tend to report their level of pain as a combination of two factors, their physical pain and their psychosocial pain. This last one is also known  as "suffering" and it is reflection of how physical pain affects you socially and psychologically. From now on, report them separately. From this point on, when asked to report your pain level, report only your physical pain. Use the following table for reference.  Pain Clinic Pain Levels (0-5/10)  Pain Level Score Description  No Pain 0   Mild pain 1 Nagging, annoying, but does not interfere with basic activities of daily living (ADL). Patients are able to eat, bathe, get dressed, toileting (being able to get on and off the toilet and perform personal hygiene functions), transfer (move in and out of bed or a chair without assistance), and maintain continence (able to control bladder and bowel functions). Blood pressure and heart rate are unaffected. A normal heart rate for a healthy adult ranges from 60 to 100 bpm (beats per minute).   Mild to moderate pain 2 Noticeable and distracting. Impossible to hide from other people. More frequent flare-ups. Still possible to adapt and function close to normal. It can be very annoying and may have occasional stronger flare-ups. With discipline, patients may get used to it and adapt.   Moderate pain 3 Interferes significantly with activities of daily living (ADL). It becomes difficult to feed, bathe, get dressed, get on and off the toilet or to perform personal hygiene functions. Difficult to get in and out of bed or a chair without assistance. Very distracting. With effort, it can be ignored when deeply involved in activities.   Moderately severe pain 4 Impossible to ignore for more than a few minutes. With effort, patients may still be able to manage work or participate in some social activities. Very difficult to concentrate. Signs of autonomic nervous system discharge are evident: dilated pupils (mydriasis); mild sweating (diaphoresis); sleep interference. Heart rate becomes elevated (>115 bpm). Diastolic blood pressure (lower number) rises above 100 mmHg.  Patients find relief in laying down and not moving.   Severe pain 5 Intense and extremely unpleasant. Associated with frowning face and frequent crying. Pain overwhelms the senses.  Ability to do any activity or maintain social relationships becomes significantly limited. Conversation becomes difficult. Pacing back and forth is common, as getting into a comfortable position is nearly impossible. Pain wakes you up from deep sleep. Physical signs will be obvious: pupillary dilation; increased sweating; goosebumps; brisk reflexes; cold, clammy hands and feet; nausea, vomiting or dry heaves; loss of appetite; significant sleep disturbance with inability to fall asleep or to remain asleep. When persistent, significant weight loss is observed due to the complete loss of appetite and sleep deprivation.  Blood pressure and heart rate becomes significantly elevated. Caution: If elevated blood pressure triggers a pounding headache associated with blurred vision, then the patient should immediately seek attention at an urgent or emergency care unit, as these may be signs of an impending stroke.    Emergency Department Pain Levels (6-10/10)  Emergency Room Pain 6 Severely limiting. Requires emergency care and should not be seen or managed at an outpatient pain management facility. Communication becomes difficult and requires great effort. Assistance to reach the emergency  department may be required. Facial flushing and profuse sweating along with potentially dangerous increases in heart rate and blood pressure will be evident.   Distressing pain 7 Self-care is very difficult. Assistance is required to transport, or use restroom. Assistance to reach the emergency department will be required. Tasks requiring coordination, such as bathing and getting dressed become very difficult.   Disabling pain 8 Self-care is no longer possible. At this level, pain is disabling. The individual is unable to do even the most "basic"  activities such as walking, eating, bathing, dressing, transferring to a bed, or toileting. Fine motor skills are lost. It is difficult to think clearly.   Incapacitating pain 9 Pain becomes incapacitating. Thought processing is no longer possible. Difficult to remember your own name. Control of movement and coordination are lost.   The worst pain imaginable 10 At this level, most patients pass out from pain. When this level is reached, collapse of the autonomic nervous system occurs, leading to a sudden drop in blood pressure and heart rate. This in turn results in a temporary and dramatic drop in blood flow to the brain, leading to a loss of consciousness. Fainting is one of the body's self defense mechanisms. Passing out puts the brain in a calmed state and causes it to shut down for a while, in order to begin the healing process.    Summary: 1. Refer to this scale when providing [place] with your pain level. 2. Be accurate and careful when reporting your pain level. This will help with your care. 3. Over-reporting your pain level will lead to loss of credibility. 4. Even a level of 1/10 means that there is pain and will be treated at our facility. 5. High, inaccurate reporting will be documented as "Symptom Exaggeration", leading to loss of credibility and suspicions of possible secondary gains such as obtaining more narcotics, or wanting to appear disabled, for fraudulent reasons. 6. Only pain levels of 5 or below will be seen at our facility. 7. Pain levels of 6 and above will be sent to the Emergency Department and the appointment cancelled. _____________________________________________________________________________________________  Gabapentin Titration  Medication used: Gabapentin (Generic Name) or Neurontin (Brand Name) 300 mg tablets/capsules  Reasons to stop increasing the dose:  Reason 1: You get good relief of symptoms, in which case there is no need to increase the daily dose any  further.    Reason 2: You develop some side effects, such as sleeping all of the time, difficulty concentrating, or becoming disoriented, in which case you need to go down on the dose, to the prior level, where you were not experiencing any side effects. Stay on that dose longer, to allow more time for your body to get use it, before attempting to increase it again.   Reasons to stop increasing the dose: Reason 1: You get good relief of symptoms, in which case there is no need to increase the daily dose any further.  Reason 2: You develop some side effects, such as sleeping all of the time, difficulty concentrating, or becoming disoriented, in which case you need to go down on the dose, to the prior level, where you were not experiencing any side effects. Stay on that dose longer, to allow more time for your body to get use it, before attempting to increase it again.  Steps to increase medication: Step 1: Start by taking 3 (three) tablet at bedtime, begin taking 1 (one) tablet at noon with lunch. Stay on this dose x  another 7 (seven) days.  Step 2: After 7 (seven) days of taking 3 (three) tablet at bedtime, and 1 (one) tablet at noon, then begin taking 1 (one) tablet in the afternoon with dinner. Stay on this dose x another 7 (seven) days.  Step 3: After 7 (seven) days of taking 3 (three) tablet at bedtime, 1 (one) tablet at noon, and 1 (one) tablet in the afternoon, then begin taking 1 (one) tablet in the morning with breakfast. Stay on this dose x another 7 (seven) days. At this point you should be taking the medicine 4 (four) times a day, or about every 6 (six) hours. This daily regimen of taking the medicine 4 (four) times a day, will be maintained from now on. You should not take any doses any sooner than every 6 (six) hours.  Step 4: After 7 (seven) days of taking 3 (three) tablet at bedtime, 1 (one) tablet at noon, 1 (one) tablet in the afternoon, and 1 (one) tablet in the morning, begin  taking 2 (two) tablets at noon with lunch. Stay on this dose x another 7 (seven) days.   Step 5: After 7 (seven) days of taking 3 (three) tablet at bedtime, 2 (two) tablets at noon, 1 (one) tablet in the afternoon, and 1 (one) tablet in the morning, begin taking 2 (two) tablets in the afternoon with dinner. Stay on this dose x another 7 (seven) days.   Step 6: After 7 (seven) days of taking 3 (three) tablet at bedtime, 2 (two) tablets at noon, 2 (two) tablets in the afternoon, and 1 (one) tablet in the morning, begin taking 2 (two) tablets in the morning with breakfast. Stay on this dose x another 7 (seven) days. At this point you should be taking the medicine 4 (four) times a day, or about every 6 (six) hours. This daily regimen of taking the medicine 4 (four) times a day, will be maintained from now on. You should not take any doses any sooner than every 6 (six) hours.  Step 7: After 7 (seven) days of taking 3 (three) tablet at bedtime, 2 (two) tablets at noon, 2 (two) tablets in the afternoon, and 2 (two) tablets in the morning, begin taking 3 (three) tablets at noon with lunch. Stay on this dose x another 7 (seven) days.   Step 8: After 7 (seven) days of taking 3 (three) tablet at bedtime, 3 (three) tablets at noon, 2 (two) tablets in the afternoon, and 2 (two) tablets in the morning, begin taking 3 (three) tablets in the afternoon with dinner. Stay on this dose x another 7 (seven) days.   Step 9: After 7 (seven) days of taking 3 (three) tablet at bedtime, 3 (three) tablets at noon, 3 (three) tablets in the afternoon, and 2 (two) tablet in the morning, begin taking 3 (three) tablets in the morning with breakfast. Stay on this dose x another 7 (seven) days. At this point you should be taking the medicine 4 (four) times a day, or about every 6 (six) hours. This daily regimen of taking the medicine 4 (four) times a day, will be maintained from now on.   Endpoint: Once you have reached the maximum dose  you can tolerate without side-effects, contact your physician so as to evaluate the results of the regimen.   Questions: Feel free to contact us for any questions or problems at [phone]  Preparing for Procedure with Sedation Instructions: . Oral Intake: Do not eat or drink anything for  at least 8 hours prior to your procedure. . Transportation: Public transportation is not allowed. Bring an adult driver. The driver must be physically present in our waiting room before any procedure can be started. Marland Kitchen Physical Assistance: Bring an adult physically capable of assisting you, in the event you need help. This adult should keep you company at home for at least 6 hours after the procedure. . Blood Pressure Medicine: Take your blood pressure medicine with a sip of water the morning of the procedure. . Blood thinners:  . Diabetics on insulin: Notify the staff so that you can be scheduled 1st case in the morning. If your diabetes requires high dose insulin, take only  of your normal insulin dose the morning of the procedure and notify the staff that you have done so. . Preventing infections: Shower with an antibacterial soap the morning of your procedure. . Build-up your immune system: Take 1000 mg of Vitamin C with every meal (3 times a day) the day prior to your procedure. Marland Kitchen Antibiotics: Inform the staff if you have a condition or reason that requires you to take antibiotics before dental procedures. . Pregnancy: If you are pregnant, call and cancel the procedure. . Sickness: If you have a cold, fever, or any active infections, call and cancel the procedure. . Arrival: You must be in the facility at least 30 minutes prior to your scheduled procedure. . Children: Do not bring children with you. . Dress appropriately: Bring dark clothing that you would not mind if they get stained. . Valuables: Do not bring any jewelry or valuables. Procedure appointments are reserved for interventional treatments  only. Marland Kitchen No Prescription Refills. . No medication changes will be discussed during procedure appointments. . No disability issues will be discussed.  ____________________________________________________________________________________________  Preparing for Procedure with Sedation Instructions: . Oral Intake: Do not eat or drink anything for at least 8 hours prior to your procedure. . Transportation: Public transportation is not allowed. Bring an adult driver. The driver must be physically present in our waiting room before any procedure can be started. Marland Kitchen Physical Assistance: Bring an adult capable of physically assisting you, in the event you need help. . Blood Pressure Medicine: Take your blood pressure medicine with a sip of water the morning of the procedure. . Insulin: Take only  of your normal insulin dose. . Preventing infections: Shower with an antibacterial soap the morning of your procedure. . Build-up your immune system: Take 1000 mg of Vitamin C with every meal (3 times a day) the day prior to your procedure. . Pregnancy: If you are pregnant, call and cancel the procedure. . Sickness: If you have a cold, fever, or any active infections, call and cancel the procedure. . Arrival: You must be in the facility at least 30 minutes prior to your scheduled procedure. . Children: Do not bring children with you. . Dress appropriately: Bring dark clothing that you would not mind if they get stained. . Valuables: Do not bring any jewelry or valuables. Procedure appointments are reserved for interventional treatments only. Marland Kitchen No Prescription Refills. . No medication changes will be discussed during procedure appointments. No disability issues will be discussed.Facet Blocks Patient Information  Description: The facets are joints in the spine between the vertebrae.  Like any joints in the body, facets can become irritated and painful.  Arthritis can also effect the facets.  By injecting  steroids and local anesthetic in and around these joints, we can temporarily block the nerve supply  to them.  Steroids act directly on irritated nerves and tissues to reduce selling and inflammation which often leads to decreased pain.  Facet blocks may be done anywhere along the spine from the neck to the low back depending upon the location of your pain.   After numbing the skin with local anesthetic (like Novocaine), a small needle is passed onto the facet joints under x-ray guidance.  You may experience a sensation of pressure while this is being done.  The entire block usually lasts about 15-25 minutes.   Conditions which may be treated by facet blocks:  Low back/buttock pain Neck/shoulder pain Certain types of headaches  Preparation for the injection:  Do not eat any solid food or dairy products within 8 hours of your appointment. You may drink clear liquid up to 3 hours before appointment.  Clear liquids include water, black coffee, juice or soda.  No milk or cream please. You may take your regular medication, including pain medications, with a sip of water before your appointment.  Diabetics should hold regular insulin (if taken separately) and take 1/2 normal NPH dose the morning of the procedure.  Carry some sugar containing items with you to your appointment. A driver must accompany you and be prepared to drive you home after your procedure. Bring all your current medications with you. An IV may be inserted and sedation may be given at the discretion of the physician. A blood pressure cuff, EKG and other monitors will often be applied during the procedure.  Some patients may need to have extra oxygen administered for a short period. You will be asked to provide medical information, including your allergies and medications, prior to the procedure.  We must know immediately if you are taking blood thinners (like Coumadin/Warfarin) or if you are allergic to IV iodine contrast (dye).  We  must know if you could possible be pregnant.  Possible side-effects:  Bleeding from needle site Infection (rare, may require surgery) Nerve injury (rare) Numbness & tingling (temporary) Difficulty urinating (rare, temporary) Spinal headache (a headache worse with upright posture) Light-headedness (temporary) Pain at injection site (serveral days) Decreased blood pressure (rare, temporary) Weakness in arm/leg (temporary) Pressure sensation in back/neck (temporary)   Call if you experience:  Fever/chills associated with headache or increased back/neck pain Headache worsened by an upright position New onset, weakness or numbness of an extremity below the injection site Hives or difficulty breathing (go to the emergency room) Inflammation or drainage at the injection site(s) Severe back/neck pain greater than usual New symptoms which are concerning to you  Please note:  Although the local anesthetic injected can often make your back or neck feel good for several hours after the injection, the pain will likely return. It takes 3-7 days for steroids to work.  You may not notice any pain relief for at least one week.  If effective, we will often do a series of 2-3 injections spaced 3-6 weeks apart to maximally decrease your pain.  After the initial series, you may be a candidate for a more permanent nerve block of the facets.  If you have any questions, please call #[phone] . [facility]

## 2016-07-27 NOTE — Progress Notes (Signed)
Safety precautions to be maintained throughout the outpatient stay will include: orient to surroundings, keep bed in low position, maintain call bell within reach at all times, provide assistance with transfer out of bed and ambulation.  

## 2016-08-17 ENCOUNTER — Other Ambulatory Visit: Payer: Self-pay | Admitting: Pain Medicine

## 2016-08-17 ENCOUNTER — Other Ambulatory Visit: Payer: Self-pay | Admitting: Obstetrics and Gynecology

## 2016-08-17 DIAGNOSIS — M7918 Myalgia, other site: Secondary | ICD-10-CM

## 2016-08-20 ENCOUNTER — Telehealth: Payer: Self-pay | Admitting: *Deleted

## 2016-08-20 NOTE — Telephone Encounter (Signed)
Pt wanting to know is she needed to continue Mag Ox 500mg - Instructed her that did need to continue and can buy OTC

## 2016-08-20 NOTE — Telephone Encounter (Signed)
Attempted to return patient's call, message left. 

## 2016-08-26 ENCOUNTER — Encounter: Payer: Self-pay | Admitting: Pain Medicine

## 2016-08-26 ENCOUNTER — Ambulatory Visit
Admission: RE | Admit: 2016-08-26 | Discharge: 2016-08-26 | Disposition: A | Discharge status: Home / Self Care | Payer: Medicare HMO | Source: Ambulatory Visit | Attending: Pain Medicine | Admitting: Pain Medicine

## 2016-08-26 ENCOUNTER — Ambulatory Visit (HOSPITAL_BASED_OUTPATIENT_CLINIC_OR_DEPARTMENT_OTHER): Payer: Medicare HMO | Admitting: Pain Medicine

## 2016-08-26 VITALS — BP 102/69 | HR 70 | Temp 98.6°F | Resp 14 | Ht 65.0 in | Wt 218.0 lb

## 2016-08-26 DIAGNOSIS — M5126 Other intervertebral disc displacement, lumbar region: Secondary | ICD-10-CM

## 2016-08-26 DIAGNOSIS — M5441 Lumbago with sciatica, right side: Secondary | ICD-10-CM

## 2016-08-26 DIAGNOSIS — M47896 Other spondylosis, lumbar region: Secondary | ICD-10-CM | POA: Diagnosis not present

## 2016-08-26 DIAGNOSIS — M47816 Spondylosis without myelopathy or radiculopathy, lumbar region: Secondary | ICD-10-CM

## 2016-08-26 DIAGNOSIS — M5442 Lumbago with sciatica, left side: Secondary | ICD-10-CM | POA: Diagnosis not present

## 2016-08-26 DIAGNOSIS — M1310 Monoarthritis, not elsewhere classified, unspecified site: Secondary | ICD-10-CM | POA: Diagnosis not present

## 2016-08-26 DIAGNOSIS — M4696 Unspecified inflammatory spondylopathy, lumbar region: Secondary | ICD-10-CM | POA: Diagnosis not present

## 2016-08-26 DIAGNOSIS — M545 Low back pain: Secondary | ICD-10-CM | POA: Diagnosis present

## 2016-08-26 DIAGNOSIS — M4310 Spondylolisthesis, site unspecified: Secondary | ICD-10-CM

## 2016-08-26 DIAGNOSIS — G8929 Other chronic pain: Secondary | ICD-10-CM | POA: Insufficient documentation

## 2016-08-26 DIAGNOSIS — M8938 Hypertrophy of bone, other site: Secondary | ICD-10-CM

## 2016-08-26 MED ORDER — ROPIVACAINE HCL 2 MG/ML IJ SOLN
9.0000 mL | Freq: Once | INTRAMUSCULAR | Status: AC
  Administered 2016-08-26: 10 mL via PERINEURAL
  Filled 2016-08-26: qty 10

## 2016-08-26 MED ORDER — LIDOCAINE HCL (PF) 1 % IJ SOLN
10.0000 mL | Freq: Once | INTRAMUSCULAR | Status: AC
  Administered 2016-08-26: 5 mL
  Filled 2016-08-26: qty 10

## 2016-08-26 MED ORDER — MIDAZOLAM HCL 5 MG/5ML IJ SOLN
1.0000 mg | INTRAMUSCULAR | Status: DC | PRN
  Administered 2016-08-26: 2 mg via INTRAVENOUS
  Filled 2016-08-26: qty 5

## 2016-08-26 MED ORDER — FENTANYL CITRATE (PF) 100 MCG/2ML IJ SOLN
25.0000 ug | INTRAMUSCULAR | Status: DC | PRN
Start: 2016-08-26 — End: 2016-08-26
  Administered 2016-08-26: 100 ug via INTRAVENOUS
  Filled 2016-08-26: qty 2

## 2016-08-26 MED ORDER — TRIAMCINOLONE ACETONIDE 40 MG/ML IJ SUSP
40.0000 mg | Freq: Once | INTRAMUSCULAR | Status: AC
  Administered 2016-08-26: 40 mg
  Filled 2016-08-26: qty 1

## 2016-08-26 MED ORDER — LACTATED RINGERS IV SOLN: 1000.0000 mL | Freq: Once | INTRAVENOUS | Status: DC

## 2016-08-26 NOTE — Progress Notes (Signed)
Safety precautions to be maintained throughout the outpatient stay will include: orient to surroundings, keep bed in low position, maintain call bell within reach at all times, provide assistance with transfer out of bed and ambulation.  

## 2016-08-26 NOTE — Progress Notes (Signed)
Patient's Name: Shelly Hanson  MRN: [id number]  Referring Provider: Delano Metz, MD  DOB: [birth year]  PCP: Phyllis Ginger  DOS: 08/26/2016  Note by: Sydnee Levans. Laban Emperor, MD  Service setting: Ambulatory outpatient  Location: [facility] (AMB) Pain Management Facility  Visit type: Procedure  Specialty: Interventional Pain Management  Patient type: Established   Primary Reason for Visit: Interventional Pain Management Treatment. CC: Back Pain (lower)  Procedure:  Anesthesia, Analgesia, Anxiolysis:  Type: Diagnostic Medial Branch Facet Block Region: Lumbar Level: L2, L3, L4, L5, & S1 Medial Branch Level(s) Laterality: Bilateral  Type: Local Anesthesia with Moderate (Conscious) Sedation Local Anesthetic: Lidocaine 1% Route: Intravenous (IV) IV Access: Secured Sedation: Meaningful verbal contact was maintained at all times during the procedure  Indication(s): Analgesia and Anxiety  Indications: 1. Chronic low back pain (Location of Primary Source of Pain) (Midline) (Bilateral) (R>L)   2. Lumbar facet syndrome (Bilateral) (R>L)   3. Lumbar facet hypertrophy (multilevel)   4. Anterolisthesis (L3 over L4 and L4 over L5)    Pain Score: Pre-procedure: 5 /10 Post-procedure: 0-No pain/10  Pre-op Assessment:  Previous date of service: 07/27/16 Service provided: Evaluation Ms. Gelder is a 56 y.o. (year old), female patient, seen today for interventional treatment. She  has a past surgical history that includes Foot surgery; Cholecystectomy; Dilation and curettage of uterus; Colonoscopy (N/A, 11/17/2013); Esophagogastroduodenoscopy (N/A, 11/17/2013); Balloon dilation (N/A, 11/17/2013); maloney dilation (N/A, 11/17/2013); and Savory dilation (N/A, 11/17/2013). Her primarily concern today is the Back Pain (lower)  Initial Vital Signs: Blood pressure 117/83, pulse 82, temperature 98.6 F (37 C), temperature source Oral, resp. rate 16, height 5\' 5"  (1.651 m), weight 218 lb (98.9 kg), SpO2 99  %. BMI: 36.28 kg/m  Risk Assessment: Allergies: Reviewed. She is allergic to codeine; flagyl [metronidazole]; naproxen; and sulfa antibiotics.  Allergy Precautions: None required Coagulopathies: Reviewed. None identified.  Blood-thinner therapy: None at this time Active Infection(s): Reviewed. None identified. Ms. Hendricksen is afebrile  Site Confirmation: Ms. Dombeck was asked to confirm the procedure and laterality before marking the site Procedure checklist: Completed Consent: Before the procedure and under the influence of no sedative(s), amnesic(s), or anxiolytics, the patient was informed of the treatment options, risks and possible complications. To fulfill our ethical and legal obligations, as recommended by the American Medical Association's Code of Ethics, I have informed the patient of my clinical impression; the nature and purpose of the treatment or procedure; the risks, benefits, and possible complications of the intervention; the alternatives, including doing nothing; the risk(s) and benefit(s) of the alternative treatment(s) or procedure(s); and the risk(s) and benefit(s) of doing nothing. The patient was provided information about the general risks and possible complications associated with the procedure. These may include, but are not limited to: failure to achieve desired goals, infection, bleeding, organ or nerve damage, allergic reactions, paralysis, and death. In addition, the patient was informed of those risks and complications associated to Spine-related procedures, such as failure to decrease pain; infection (i.e.: Meningitis, epidural or intraspinal abscess); bleeding (i.e.: epidural hematoma, subarachnoid hemorrhage, or any other type of intraspinal or peri-dural bleeding); organ or nerve damage (i.e.: Any type of peripheral nerve, nerve root, or spinal cord injury) with subsequent damage to sensory, motor, and/or autonomic systems, resulting in permanent pain, numbness, and/or  weakness of one or several areas of the body; allergic reactions; (i.e.: anaphylactic reaction); and/or death. Furthermore, the patient was informed of those risks and complications associated with the medications. These include, but are not  limited to: allergic reactions (i.e.: anaphylactic or anaphylactoid reaction(s)); adrenal axis suppression; blood sugar elevation that in diabetics may result in ketoacidosis or comma; water retention that in patients with history of congestive heart failure may result in shortness of breath, pulmonary edema, and decompensation with resultant heart failure; weight gain; swelling or edema; medication-induced neural toxicity; particulate matter embolism and blood vessel occlusion with resultant organ, and/or nervous system infarction; and/or aseptic necrosis of one or more joints. Finally, the patient was informed that Medicine is not an exact science; therefore, there is also the possibility of unforeseen or unpredictable risks and/or possible complications that may result in a catastrophic outcome. The patient indicated having understood very clearly. We have given the patient no guarantees and we have made no promises. Enough time was given to the patient to ask questions, all of which were answered to the patient's satisfaction. Ms. Hable has indicated that she wanted to continue with the procedure. Attestation: I, the ordering provider, attest that I have discussed with the patient the benefits, risks, side-effects, alternatives, likelihood of achieving goals, and potential problems during recovery for the procedure that I have provided informed consent. Date: 08/26/2016; Time: 11:02 AM  Pre-Procedure Preparation:  Monitoring: As per clinic protocol. Respiration, ETCO2, SpO2, BP, heart rate and rhythm monitor placed and checked for adequate function Safety Precautions: Patient was assessed for positional comfort and pressure points before starting the  procedure. Time-out: I initiated and conducted the "Time-out" before starting the procedure, as per protocol. The patient was asked to participate by confirming the accuracy of the "Time Out" information. Verification of the correct person, site, and procedure were performed and confirmed by me, the nursing staff, and the patient. "Time-out" conducted as per Joint Commission's Universal Protocol (UP.01.01.01). "Time-out" Date & Time: 08/26/2016; 1155 hrs.  Description of Procedure Process:   Position: Prone Target Area: For Lumbar Facet blocks, the target is the groove formed by the junction of the transverse process and superior articular process. For the L5 dorsal ramus, the target is the notch between superior articular process and sacral ala. For the S1 dorsal ramus, the target is the superior and lateral edge of the posterior S1 Sacral foramen. Approach: Paramedial approach. Area Prepped: Entire Posterior Lumbosacral Region Prepping solution: ChloraPrep (2% chlorhexidine gluconate and 70% isopropyl alcohol) Safety Precautions: Aspiration looking for blood return was conducted prior to all injections. At no point did we inject any substances, as a needle was being advanced. No attempts were made at seeking any paresthesias. Safe injection practices and needle disposal techniques used. Medications properly checked for expiration dates. SDV (single dose vial) medications used. Description of the Procedure: Protocol guidelines were followed. The patient was placed in position over the fluoroscopy table. The target area was identified and the area prepped in the usual manner. Skin desensitized using vapocoolant spray. Skin & deeper tissues infiltrated with local anesthetic. Appropriate amount of time allowed to pass for local anesthetics to take effect. The procedure needle was introduced through the skin, ipsilateral to the reported pain, and advanced to the target area. Employing the "Medial Branch  Technique", the needles were advanced to the angle made by the superior and medial portion of the transverse process, and the lateral and inferior portion of the superior articulating process of the targeted vertebral bodies. This area is known as "Burton's Eye" or the "Eye of the [place] Dog". A procedure needle was introduced through the skin, and this time advanced to the angle made by the superior  and medial border of the sacral ala, and the lateral border of the S1 vertebral body. This last needle was later repositioned at the superior and lateral border of the posterior S1 foramen. Negative aspiration confirmed. Solution injected in intermittent fashion, asking for systemic symptoms every 0.5cc of injectate. The needles were then removed and the area cleansed, making sure to leave some of the prepping solution back to take advantage of its long term bactericidal properties. Vitals:   08/26/16 1216 08/26/16 1221 08/26/16 1231 08/26/16 1239  BP: 101/63 108/78 111/71 102/69  Pulse: 67 70 71 70  Resp: 15 19 13 14   Temp:      TempSrc:      SpO2: 96% 94% 98% 96%  Weight:      Height:        Start Time: 1155 hrs. End Time: 1208 hrs.  Illustration of the posterior view of the lumbar spine and the posterior neural structures. Laminae of L2 through S1 are labeled. DPRL5, dorsal primary ramus of L5; DPRS1, dorsal primary ramus of S1; DPR3, dorsal primary ramus of L3; FJ, facet (zygapophyseal) joint L3-L4; I, inferior articular process of L4; LB1, lateral branch of dorsal primary ramus of L1; IAB, inferior articular branches from L3 medial branch (supplies L4-L5 facet joint); IBP, intermediate branch plexus; MB3, medial branch of dorsal primary ramus of L3; NR3, third lumbar nerve root; S, superior articular process of L5; SAB, superior articular branches from L4 (supplies L4-5 facet joint also); TP3, transverse process of L3.  Materials:  Needle(s) Type: Regular needle Gauge: 22G Length:  3.5-in Medication(s): We administered midazolam, fentaNYL, triamcinolone acetonide, lidocaine (PF), ropivacaine (PF) 2 mg/mL (0.2%), triamcinolone acetonide, lidocaine (PF), and ropivacaine (PF) 2 mg/mL (0.2%). Please see chart orders for dosing details.  Imaging Guidance (Spinal):  Type of Imaging Technique: Fluoroscopy Guidance (Spinal) Indication(s): Assistance in needle guidance and placement for procedures requiring needle placement in or near specific anatomical locations not easily accessible without such assistance. Exposure Time: Please see nurses notes. Contrast: None used. Fluoroscopic Guidance: I was personally present during the use of fluoroscopy. "Tunnel Vision Technique" used to obtain the best possible view of the target area. Parallax error corrected before commencing the procedure. "Direction-depth-direction" technique used to introduce the needle under continuous pulsed fluoroscopy. Once target was reached, antero-posterior, oblique, and lateral fluoroscopic projection used confirm needle placement in all planes. Images permanently stored in EMR. Interpretation: No contrast injected. I personally interpreted the imaging intraoperatively. Adequate needle placement confirmed in multiple planes. Permanent images saved into the patient's record.  Antibiotic Prophylaxis:  Indication(s): None identified Antibiotic given: None  Post-operative Assessment:  EBL: None Complications: No immediate post-treatment complications observed by team, or reported by patient. Note: The patient tolerated the entire procedure well. A repeat set of vitals were taken after the procedure and the patient was kept under observation following institutional policy, for this type of procedure. Post-procedural neurological assessment was performed, showing return to baseline, prior to discharge. The patient was provided with post-procedure discharge instructions, including a section on how to identify  potential problems. Should any problems arise concerning this procedure, the patient was given instructions to immediately contact us, at any time, without hesitation. In any case, we plan to contact the patient by telephone for a follow-up status report regarding this interventional procedure. Comments:  No additional relevant information.  Plan of Care  Disposition: Discharge home  Discharge Date & Time: 08/26/2016; 1245 hrs.  Physician-requested Follow-up:  Return for post-procedure eval (in 2  wks), by MD.  Future Appointments Date Time Provider Department Center  09/10/2016 9:00 AM Delano Metz, MD [facility] None   Medications ordered for procedure: Meds ordered this encounter  Medications  . lactated ringers infusion 1,000 mL  . midazolam (VERSED) 5 MG/5ML injection 1-2 mg    Make sure Flumazenil is available in the pyxis when using this medication. If oversedation occurs, administer 0.2 mg IV over 15 sec. If after 45 sec no response, administer 0.2 mg again over 1 min; may repeat at 1 min intervals; not to exceed 4 doses (1 mg)  . fentaNYL (SUBLIMAZE) injection 25-50 mcg    Make sure Narcan is available in the pyxis when using this medication. In the event of respiratory depression (RR< 8/min): Titrate NARCAN (naloxone) in increments of 0.1 to 0.2 mg IV at 2-3 minute intervals, until desired degree of reversal.  . triamcinolone acetonide (KENALOG-40) injection 40 mg  . lidocaine (PF) (XYLOCAINE) 1 % injection 10 mL  . ropivacaine (PF) 2 mg/mL (0.2%) (NAROPIN) injection 9 mL  . triamcinolone acetonide (KENALOG-40) injection 40 mg  . lidocaine (PF) (XYLOCAINE) 1 % injection 10 mL  . ropivacaine (PF) 2 mg/mL (0.2%) (NAROPIN) injection 9 mL   Medications administered: We administered midazolam, fentaNYL, triamcinolone acetonide, lidocaine (PF), ropivacaine (PF) 2 mg/mL (0.2%), triamcinolone acetonide, lidocaine (PF), and ropivacaine (PF) 2 mg/mL (0.2%).  See the medical record for  exact dosing, route, and time of administration.  Lab-work, Procedure(s), & Referral(s) Ordered: Orders Placed This Encounter  Procedures  . DG C-Arm 1-60 Min-No Report  . Informed Consent Details: Transcribe to consent form and obtain patient signature  . Provider attestation of informed consent for procedure/surgical case  . Verify informed consent  . Discharge instructions  . Follow-up   Imaging Ordered: No results found for this or any previous visit. New Prescriptions   No medications on file   Primary Care Physician: Phyllis Ginger Location: [facility] Note by: Sydnee Levans. Laban Emperor, M.D, DABA, DABAPM, DABPM, DABIPP, FIPP Date: 08/26/2016; Time: 12:42 PM  Disclaimer:  Medicine is not an Visual merchandiser. The only guarantee in medicine is that nothing is guaranteed. It is important to note that the decision to proceed with this intervention was based on the information collected from the patient. The Data and conclusions were drawn from the patient's questionnaire, the interview, and the physical examination. Because the information was provided in large part by the patient, it cannot be guaranteed that it has not been purposely or unconsciously manipulated. Every effort has been made to obtain as much relevant data as possible for this evaluation. It is important to note that the conclusions that lead to this procedure are derived in large part from the available data. Always take into account that the treatment will also be dependent on availability of resources and existing treatment guidelines, considered by other Pain Management Practitioners as being common knowledge and practice, at the time of the intervention. For Medico-Legal purposes, it is also important to point out that variation in procedural techniques and pharmacological choices are the acceptable norm. The indications, contraindications, technique, and results of the above procedure should  only be interpreted and judged by a Board-Certified Interventional Pain Specialist with extensive familiarity and expertise in the same exact procedure and technique.  Instructions provided at this appointment: Patient Instructions   Post-Procedure instructions Instructions:  Apply ice: Fill a plastic sandwich bag with crushed ice. Cover it with a small towel and apply to  injection site. Apply for 15 minutes then remove x 15 minutes. Repeat sequence on day of procedure, until you go to bed. The purpose is to minimize swelling and discomfort after procedure.  Apply heat: Apply heat to procedure site starting the day following the procedure. The purpose is to treat any soreness and discomfort from the procedure.  Food intake: Start with clear liquids (like water) and advance to regular food, as tolerated.   Physical activities: Keep activities to a minimum for the first 8 hours after the procedure.   Driving: If you have received any sedation, you are not allowed to drive for 24 hours after your procedure.  Blood thinner: Restart your blood thinner 6 hours after your procedure. (Only for those taking blood thinners)  Insulin: As soon as you can eat, you may resume your normal dosing schedule. (Only for those taking insulin)  Infection prevention: Keep procedure site clean and dry.  Post-procedure Pain Diary: Extremely important that this be done correctly and accurately. Recorded information will be used to determine the next step in treatment.  Pain evaluated is that of treated area only. Do not include pain from an untreated area.  Complete every hour, on the hour, for the initial 8 hours. Set an alarm to help you do this part accurately.  Do not go to sleep and have it completed later. It will not be accurate.  Follow-up appointment: Keep your follow-up appointment after the procedure. Usually 2 weeks for most procedures. (6 weeks in the case of radiofrequency.) Bring you pain diary.   Expect:  From numbing medicine (AKA: Local Anesthetics): Numbness or decrease in pain.  Onset: Full effect within 15 minutes of injected.  Duration: It will depend on the type of local anesthetic used. On the average, 1 to 8 hours.   From steroids: Decrease in swelling or inflammation. Once inflammation is improved, relief of the pain will follow.  Onset of benefits: Depends on the amount of swelling present. The more swelling, the longer it will take for the benefits to be seen.   Duration: Steroids will stay in the system x 2 weeks. Duration of benefits will depend on multiple posibilities including persistent irritating factors.  From procedure: Some discomfort is to be expected once the numbing medicine wears off. This should be minimal if ice and heat are applied as instructed. Call if:  You experience numbness and weakness that gets worse with time, as opposed to wearing off.  New onset bowel or bladder incontinence. (Spinal procedures only)  Emergency Numbers:  Durning business hours (Monday - Thursday, 8:00 AM - 4:00 PM) (Friday, 9:00 AM - 12:00 Noon): [phone]  After hours: [phone] _____________________________________________________________________________________________  ____________________________________________________________________________________________  Medication Rules  Applies to: All patients receiving prescriptions (written or electronic).  Pharmacy of record: Pharmacy where electronic prescriptions will be sent. If written prescriptions are taken to a different pharmacy, please inform the nursing staff. The pharmacy listed in the electronic medical record should be the one where you would like electronic prescriptions to be sent.  Prescription refills: Only during scheduled appointments. Applies to both, written and electronic prescriptions.  NOTE: The following applies primarily to controlled substances (Opioid Pain  Medications)  Patient's responsibilities: 1. Pain Pills: Bring all pain pills to every appointment (except for procedure appointments). 2. Pill Bottles: Bring pills in original pharmacy bottle. Always bring newest bottle. Bring bottle, even if empty. 3. Medication refills: You are responsible for knowing and keeping track of what medications you need  refilled. The day before your appointment, write a list of all prescriptions that need to be refilled. Bring that list to your appointment and give it to the admitting nurse. Prescriptions will be written only during appointments. If you forget a medication, it will not be "Called in", "Faxed", or "electronically sent". You will need to get another appointment to get these prescribed. 4. Prescription Accuracy: You are responsible for carefully inspecting your prescriptions before leaving our office. Have the discharge nurse carefully go over each prescription with you, before taking them home. Make sure that your name is accurately spelled, that your address is correct. Check the name and dose of your medication to make sure it is accurate. Check the number of pills, and the written instructions to make sure they are clear and accurate. Make sure that you are given enough medication to last until your next medication refill appointment. 5. Taking Medication: Take medication as prescribed. Never take more pills than instructed. Never take medication more frequently than prescribed. Taking less pills or less frequently is permitted and encouraged, when it comes to controlled substances (written prescriptions).  6. Inform other Doctors: Always inform, all of your healthcare providers, of all the medications you take. 7. Pain Medication from other Providers: You are not allowed to accept any additional pain medication from any other Doctor or Healthcare provider. There are two exceptions to this rule. (see below) In the event that you require additional pain  medication, you are responsible for notifying us, as stated below. 8. Medication Agreement: You are responsible for carefully reading and following our Medication Agreement. This must be signed before receiving any prescriptions from our practice. Safely store a copy of your signed Agreement. Violations to the Agreement will result in no further prescriptions. (Additional copies of our Medication Agreement are available upon request.) 9. Laws, Rules, & Regulations: All patients are expected to follow all [address] and [employer], Rules, [place] [employer]. Ignorance of the Laws does not constitute a valid excuse.  Exceptions: There are only two exceptions to the rule of not receiving pain medications from other Healthcare Providers. 1. Exception #1 (Emergencies): In the event of an emergency (i.e.: accident requiring emergency care), you are allowed to receive additional pain medication. However, you are responsible for: As soon as you are able, call our office [phone], at any time of the day or night, and leave a message stating your name, the date and nature of the emergency, and the name and dose of the medication prescribed. In the event that your call is answered by a member of our staff, make sure to document and save the date, time, and the name of the person that took your information.  2. Exception #2 (Planned Surgery): In the event that you are scheduled by another doctor or dentist to have any type of surgery or procedure, you are allowed (for a period no longer than 30 days), to receive additional pain medication, for the acute post-op pain. However, in this case, you are responsible for picking up a copy of our "Post-op Pain Management for Surgeons" handout, and giving it to your surgeon or dentist. This document is available at our office, and does not require an appointment to obtain it. Simply go to our office during business hours (Monday-Thursday from 8:00 AM to 4:00 PM) (Friday  8:00 AM to 12:00 Noon) or if you have a scheduled appointment with [place], prior to your surgery, and ask for it by name. In addition, you  will need to provide [place] with your name, name of your surgeon, type of surgery, and date of procedure or surgery.  _____________________________________________________________________________________________Pain Score  Introduction: The pain score used by this practice is the Verbal Numerical Rating Scale (VNRS-11). This is an 11-point scale. It is for adults and children 10 years or older. There are significant differences in how the pain score is reported, used, and applied. Forget everything you learned in the past and learn this scoring system.  General Information: The scale should reflect your current level of pain. Unless you are specifically asked for the level of your worst pain, or your average pain. If you are asked for one of these two, then it should be understood that it is over the past 24 hours.  Basic Activities of Daily Living (ADL): Personal hygiene, dressing, eating, transferring, and using restroom.  Instructions: Most patients tend to report their level of pain as a combination of two factors, their physical pain and their psychosocial pain. This last one is also known as "suffering" and it is reflection of how physical pain affects you socially and psychologically. From now on, report them separately. From this point on, when asked to report your pain level, report only your physical pain. Use the following table for reference.  Pain Clinic Pain Levels (0-5/10)  Pain Level Score Description  No Pain 0   Mild pain 1 Nagging, annoying, but does not interfere with basic activities of daily living (ADL). Patients are able to eat, bathe, get dressed, toileting (being able to get on and off the toilet and perform personal hygiene functions), transfer (move in and out of bed or a chair without assistance), and maintain continence (able to control  bladder and bowel functions). Blood pressure and heart rate are unaffected. A normal heart rate for a healthy adult ranges from 60 to 100 bpm (beats per minute).   Mild to moderate pain 2 Noticeable and distracting. Impossible to hide from other people. More frequent flare-ups. Still possible to adapt and function close to normal. It can be very annoying and may have occasional stronger flare-ups. With discipline, patients may get used to it and adapt.   Moderate pain 3 Interferes significantly with activities of daily living (ADL). It becomes difficult to feed, bathe, get dressed, get on and off the toilet or to perform personal hygiene functions. Difficult to get in and out of bed or a chair without assistance. Very distracting. With effort, it can be ignored when deeply involved in activities.   Moderately severe pain 4 Impossible to ignore for more than a few minutes. With effort, patients may still be able to manage work or participate in some social activities. Very difficult to concentrate. Signs of autonomic nervous system discharge are evident: dilated pupils (mydriasis); mild sweating (diaphoresis); sleep interference. Heart rate becomes elevated (>115 bpm). Diastolic blood pressure (lower number) rises above 100 mmHg. Patients find relief in laying down and not moving.   Severe pain 5 Intense and extremely unpleasant. Associated with frowning face and frequent crying. Pain overwhelms the senses.  Ability to do any activity or maintain social relationships becomes significantly limited. Conversation becomes difficult. Pacing back and forth is common, as getting into a comfortable position is nearly impossible. Pain wakes you up from deep sleep. Physical signs will be obvious: pupillary dilation; increased sweating; goosebumps; brisk reflexes; cold, clammy hands and feet; nausea, vomiting or dry heaves; loss of appetite; significant sleep disturbance with inability to fall asleep or to remain  asleep. When persistent, significant weight loss is observed due to the complete loss of appetite and sleep deprivation.  Blood pressure and heart rate becomes significantly elevated. Caution: If elevated blood pressure triggers a pounding headache associated with blurred vision, then the patient should immediately seek attention at an urgent or emergency care unit, as these may be signs of an impending stroke.    Emergency Department Pain Levels (6-10/10)  Emergency Room Pain 6 Severely limiting. Requires emergency care and should not be seen or managed at an outpatient pain management facility. Communication becomes difficult and requires great effort. Assistance to reach the emergency department may be required. Facial flushing and profuse sweating along with potentially dangerous increases in heart rate and blood pressure will be evident.   Distressing pain 7 Self-care is very difficult. Assistance is required to transport, or use restroom. Assistance to reach the emergency department will be required. Tasks requiring coordination, such as bathing and getting dressed become very difficult.   Disabling pain 8 Self-care is no longer possible. At this level, pain is disabling. The individual is unable to do even the most "basic" activities such as walking, eating, bathing, dressing, transferring to a bed, or toileting. Fine motor skills are lost. It is difficult to think clearly.   Incapacitating pain 9 Pain becomes incapacitating. Thought processing is no longer possible. Difficult to remember your own name. Control of movement and coordination are lost.   The worst pain imaginable 10 At this level, most patients pass out from pain. When this level is reached, collapse of the autonomic nervous system occurs, leading to a sudden drop in blood pressure and heart rate. This in turn results in a temporary and dramatic drop in blood flow to the brain, leading to a loss of consciousness. Fainting is one of  the body's self defense mechanisms. Passing out puts the brain in a calmed state and causes it to shut down for a while, in order to begin the healing process.    Summary: 1. Refer to this scale when providing us with your pain level. 2. Be accurate and careful when reporting your pain level. This will help with your care. 3. Over-reporting your pain level will lead to loss of credibility. 4. Even a level of 1/10 means that there is pain and will be treated at our facility. 5. High, inaccurate reporting will be documented as "Symptom Exaggeration", leading to loss of credibility and suspicions of possible secondary gains such as obtaining more narcotics, or wanting to appear disabled, for fraudulent reasons. 6. Only pain levels of 5 or below will be seen at our facility. 7. Pain levels of 6 and above will be sent to the Emergency Department and the appointment cancelled. _____________________________________________________________________________________________  Pain Management Discharge Instructions  General Discharge Instructions :  If you need to reach your doctor call: Monday-Friday 8:00 am - 4:00 pm at [phone] or toll free [phone].  After clinic hours [phone] to have operator reach doctor.  Bring all of your medication bottles to all your appointments in the pain clinic.  To cancel or reschedule your appointment with Pain Management please remember to call 24 hours in advance to avoid a fee.  Refer to the educational materials which you have been given on: General Risks, I had my Procedure. Discharge Instructions, Post Sedation.  Post Procedure Instructions:  The drugs you were given will stay in your system until tomorrow, so for the next 24 hours you should not drive, make any legal decisions or  drink any alcoholic beverages.  You may eat anything you prefer, but it is better to start with liquids then soups and crackers, and gradually work up to solid  foods.  Please notify your doctor immediately if you have any unusual bleeding, trouble breathing or pain that is not related to your normal pain.  Depending on the type of procedure that was done, some parts of your body may feel week and/or numb.  This usually clears up by tonight or the next day.  Walk with the use of an assistive device or accompanied by an adult for the 24 hours.  You may use ice on the affected area for the first 24 hours.  Put ice in a Ziploc bag and cover with a towel and place against area 15 minutes on 15 minutes off.  You may switch to heat after 24 hours.

## 2016-08-26 NOTE — Patient Instructions (Addendum)
Post-Procedure instructions Instructions:  Apply ice: Fill a plastic sandwich bag with crushed ice. Cover it with a small towel and apply to injection site. Apply for 15 minutes then remove x 15 minutes. Repeat sequence on day of procedure, until you go to bed. The purpose is to minimize swelling and discomfort after procedure.  Apply heat: Apply heat to procedure site starting the day following the procedure. The purpose is to treat any soreness and discomfort from the procedure.  Food intake: Start with clear liquids (like water) and advance to regular food, as tolerated.   Physical activities: Keep activities to a minimum for the first 8 hours after the procedure.   Driving: If you have received any sedation, you are not allowed to drive for 24 hours after your procedure.  Blood thinner: Restart your blood thinner 6 hours after your procedure. (Only for those taking blood thinners)  Insulin: As soon as you can eat, you may resume your normal dosing schedule. (Only for those taking insulin)  Infection prevention: Keep procedure site clean and dry.  Post-procedure Pain Diary: Extremely important that this be done correctly and accurately. Recorded information will be used to determine the next step in treatment.  Pain evaluated is that of treated area only. Do not include pain from an untreated area.  Complete every hour, on the hour, for the initial 8 hours. Set an alarm to help you do this part accurately.  Do not go to sleep and have it completed later. It will not be accurate.  Follow-up appointment: Keep your follow-up appointment after the procedure. Usually 2 weeks for most procedures. (6 weeks in the case of radiofrequency.) Bring you pain diary.  Expect:  From numbing medicine (AKA: Local Anesthetics): Numbness or decrease in pain.  Onset: Full effect within 15 minutes of injected.  Duration: It will depend on the type of local anesthetic used. On the average, 1 to 8  hours.   From steroids: Decrease in swelling or inflammation. Once inflammation is improved, relief of the pain will follow.  Onset of benefits: Depends on the amount of swelling present. The more swelling, the longer it will take for the benefits to be seen.   Duration: Steroids will stay in the system x 2 weeks. Duration of benefits will depend on multiple posibilities including persistent irritating factors.  From procedure: Some discomfort is to be expected once the numbing medicine wears off. This should be minimal if ice and heat are applied as instructed. Call if:  You experience numbness and weakness that gets worse with time, as opposed to wearing off.  New onset bowel or bladder incontinence. (Spinal procedures only)  Emergency Numbers:  Durning business hours (Monday - Thursday, 8:00 AM - 4:00 PM) (Friday, 9:00 AM - 12:00 Noon): [phone]  After hours: [phone] _____________________________________________________________________________________________  ____________________________________________________________________________________________  Medication Rules  Applies to: All patients receiving prescriptions (written or electronic).  Pharmacy of record: Pharmacy where electronic prescriptions will be sent. If written prescriptions are taken to a different pharmacy, please inform the nursing staff. The pharmacy listed in the electronic medical record should be the one where you would like electronic prescriptions to be sent.  Prescription refills: Only during scheduled appointments. Applies to both, written and electronic prescriptions.  NOTE: The following applies primarily to controlled substances (Opioid Pain Medications)  Patient's responsibilities: 1. Pain Pills: Bring all pain pills to every appointment (except for procedure appointments). 2. Pill Bottles: Bring pills in original pharmacy bottle. Always bring newest   bottle. Bring bottle, even if  empty. 3. Medication refills: You are responsible for knowing and keeping track of what medications you need refilled. The day before your appointment, write a list of all prescriptions that need to be refilled. Bring that list to your appointment and give it to the admitting nurse. Prescriptions will be written only during appointments. If you forget a medication, it will not be "Called in", "Faxed", or "electronically sent". You will need to get another appointment to get these prescribed. 4. Prescription Accuracy: You are responsible for carefully inspecting your prescriptions before leaving our office. Have the discharge nurse carefully go over each prescription with you, before taking them home. Make sure that your name is accurately spelled, that your address is correct. Check the name and dose of your medication to make sure it is accurate. Check the number of pills, and the written instructions to make sure they are clear and accurate. Make sure that you are given enough medication to last until your next medication refill appointment. 5. Taking Medication: Take medication as prescribed. Never take more pills than instructed. Never take medication more frequently than prescribed. Taking less pills or less frequently is permitted and encouraged, when it comes to controlled substances (written prescriptions).  6. Inform other Doctors: Always inform, all of your healthcare providers, of all the medications you take. 7. Pain Medication from other Providers: You are not allowed to accept any additional pain medication from any other Doctor or Healthcare provider. There are two exceptions to this rule. (see below) In the event that you require additional pain medication, you are responsible for notifying us, as stated below. 8. Medication Agreement: You are responsible for carefully reading and following our Medication Agreement. This must be signed before receiving any prescriptions from our practice.  Safely store a copy of your signed Agreement. Violations to the Agreement will result in no further prescriptions. (Additional copies of our Medication Agreement are available upon request.) 9. Laws, Rules, & Regulations: All patients are expected to follow all Federal and State Laws, Statutes, Rules, & Regulations. Ignorance of the Laws does not constitute a valid excuse.  Exceptions: There are only two exceptions to the rule of not receiving pain medications from other Healthcare Providers. 1. Exception #1 (Emergencies): In the event of an emergency (i.e.: accident requiring emergency care), you are allowed to receive additional pain medication. However, you are responsible for: As soon as you are able, call our office [phone], at any time of the day or night, and leave a message stating your name, the date and nature of the emergency, and the name and dose of the medication prescribed. In the event that your call is answered by a member of our staff, make sure to document and save the date, time, and the name of the person that took your information.  2. Exception #2 (Planned Surgery): In the event that you are scheduled by another doctor or dentist to have any type of surgery or procedure, you are allowed (for a period no longer than 30 days), to receive additional pain medication, for the acute post-op pain. However, in this case, you are responsible for picking up a copy of our "Post-op Pain Management for Surgeons" handout, and giving it to your surgeon or dentist. This document is available at our office, and does not require an appointment to obtain it. Simply go to our office during business hours (Monday-Thursday from 8:00 AM to 4:00 PM) (Friday 8:00 AM to 12:00 Noon)   or if you have a scheduled appointment with us, prior to your surgery, and ask for it by name. In addition, you will need to provide us with your name, name of your surgeon, type of surgery, and date of procedure or  surgery.  _____________________________________________________________________________________________Pain Score  Introduction: The pain score used by this practice is the Verbal Numerical Rating Scale (VNRS-11). This is an 11-point scale. It is for adults and children 10 years or older. There are significant differences in how the pain score is reported, used, and applied. Forget everything you learned in the past and learn this scoring system.  General Information: The scale should reflect your current level of pain. Unless you are specifically asked for the level of your worst pain, or your average pain. If you are asked for one of these two, then it should be understood that it is over the past 24 hours.  Basic Activities of Daily Living (ADL): Personal hygiene, dressing, eating, transferring, and using restroom.  Instructions: Most patients tend to report their level of pain as a combination of two factors, their physical pain and their psychosocial pain. This last one is also known as "suffering" and it is reflection of how physical pain affects you socially and psychologically. From now on, report them separately. From this point on, when asked to report your pain level, report only your physical pain. Use the following table for reference.  Pain Clinic Pain Levels (0-5/10)  Pain Level Score Description  No Pain 0   Mild pain 1 Nagging, annoying, but does not interfere with basic activities of daily living (ADL). Patients are able to eat, bathe, get dressed, toileting (being able to get on and off the toilet and perform personal hygiene functions), transfer (move in and out of bed or a chair without assistance), and maintain continence (able to control bladder and bowel functions). Blood pressure and heart rate are unaffected. A normal heart rate for a healthy adult ranges from 60 to 100 bpm (beats per minute).   Mild to moderate pain 2 Noticeable and distracting. Impossible to hide from  other people. More frequent flare-ups. Still possible to adapt and function close to normal. It can be very annoying and may have occasional stronger flare-ups. With discipline, patients may get used to it and adapt.   Moderate pain 3 Interferes significantly with activities of daily living (ADL). It becomes difficult to feed, bathe, get dressed, get on and off the toilet or to perform personal hygiene functions. Difficult to get in and out of bed or a chair without assistance. Very distracting. With effort, it can be ignored when deeply involved in activities.   Moderately severe pain 4 Impossible to ignore for more than a few minutes. With effort, patients may still be able to manage work or participate in some social activities. Very difficult to concentrate. Signs of autonomic nervous system discharge are evident: dilated pupils (mydriasis); mild sweating (diaphoresis); sleep interference. Heart rate becomes elevated (>115 bpm). Diastolic blood pressure (lower number) rises above 100 mmHg. Patients find relief in laying down and not moving.   Severe pain 5 Intense and extremely unpleasant. Associated with frowning face and frequent crying. Pain overwhelms the senses.  Ability to do any activity or maintain social relationships becomes significantly limited. Conversation becomes difficult. Pacing back and forth is common, as getting into a comfortable position is nearly impossible. Pain wakes you up from deep sleep. Physical signs will be obvious: pupillary dilation; increased sweating; goosebumps; brisk reflexes; cold, clammy   hands and feet; nausea, vomiting or dry heaves; loss of appetite; significant sleep disturbance with inability to fall asleep or to remain asleep. When persistent, significant weight loss is observed due to the complete loss of appetite and sleep deprivation.  Blood pressure and heart rate becomes significantly elevated. Caution: If elevated blood pressure triggers a pounding  headache associated with blurred vision, then the patient should immediately seek attention at an urgent or emergency care unit, as these may be signs of an impending stroke.    Emergency Department Pain Levels (6-10/10)  Emergency Room Pain 6 Severely limiting. Requires emergency care and should not be seen or managed at an outpatient pain management facility. Communication becomes difficult and requires great effort. Assistance to reach the emergency department may be required. Facial flushing and profuse sweating along with potentially dangerous increases in heart rate and blood pressure will be evident.   Distressing pain 7 Self-care is very difficult. Assistance is required to transport, or use restroom. Assistance to reach the emergency department will be required. Tasks requiring coordination, such as bathing and getting dressed become very difficult.   Disabling pain 8 Self-care is no longer possible. At this level, pain is disabling. The individual is unable to do even the most "basic" activities such as walking, eating, bathing, dressing, transferring to a bed, or toileting. Fine motor skills are lost. It is difficult to think clearly.   Incapacitating pain 9 Pain becomes incapacitating. Thought processing is no longer possible. Difficult to remember your own name. Control of movement and coordination are lost.   The worst pain imaginable 10 At this level, most patients pass out from pain. When this level is reached, collapse of the autonomic nervous system occurs, leading to a sudden drop in blood pressure and heart rate. This in turn results in a temporary and dramatic drop in blood flow to the brain, leading to a loss of consciousness. Fainting is one of the body's self defense mechanisms. Passing out puts the brain in a calmed state and causes it to shut down for a while, in order to begin the healing process.    Summary: 1. Refer to this scale when providing us with your pain  level. 2. Be accurate and careful when reporting your pain level. This will help with your care. 3. Over-reporting your pain level will lead to loss of credibility. 4. Even a level of 1/10 means that there is pain and will be treated at our facility. 5. High, inaccurate reporting will be documented as "Symptom Exaggeration", leading to loss of credibility and suspicions of possible secondary gains such as obtaining more narcotics, or wanting to appear disabled, for fraudulent reasons. 6. Only pain levels of 5 or below will be seen at our facility. 7. Pain levels of 6 and above will be sent to the Emergency Department and the appointment cancelled. _____________________________________________________________________________________________  Pain Management Discharge Instructions  General Discharge Instructions :  If you need to reach your doctor call: Monday-Friday 8:00 am - 4:00 pm at [phone] or toll free [phone].  After clinic hours [phone] to have operator reach doctor.  Bring all of your medication bottles to all your appointments in the pain clinic.  To cancel or reschedule your appointment with Pain Management please remember to call 24 hours in advance to avoid a fee.  Refer to the educational materials which you have been given on: General Risks, I had my Procedure. Discharge Instructions, Post Sedation.  Post Procedure Instructions:  The drugs you were   given will stay in your system until tomorrow, so for the next 24 hours you should not drive, make any legal decisions or drink any alcoholic beverages.  You may eat anything you prefer, but it is better to start with liquids then soups and crackers, and gradually work up to solid foods.  Please notify your doctor immediately if you have any unusual bleeding, trouble breathing or pain that is not related to your normal pain.  Depending on the type of procedure that was done, some parts of your body may feel week  and/or numb.  This usually clears up by tonight or the next day.  Walk with the use of an assistive device or accompanied by an adult for the 24 hours.  You may use ice on the affected area for the first 24 hours.  Put ice in a Ziploc bag and cover with a towel and place against area 15 minutes on 15 minutes off.  You may switch to heat after 24 hours. 

## 2016-08-27 ENCOUNTER — Telehealth: Payer: Self-pay

## 2016-08-27 NOTE — Telephone Encounter (Signed)
Denies any needs at this time. "Im OK'". Instructed to call if needed.

## 2016-08-28 ENCOUNTER — Other Ambulatory Visit: Payer: Self-pay | Admitting: Obstetrics and Gynecology

## 2016-09-10 ENCOUNTER — Ambulatory Visit: Payer: Medicare HMO | Admitting: Pain Medicine

## 2016-09-22 ENCOUNTER — Ambulatory Visit: Payer: Medicare HMO | Attending: Pain Medicine | Admitting: Pain Medicine

## 2016-09-22 ENCOUNTER — Encounter: Payer: Self-pay | Admitting: Pain Medicine

## 2016-09-22 VITALS — BP 121/76 | HR 84 | Temp 98.4°F | Resp 16 | Ht 66.0 in | Wt 218.0 lb

## 2016-09-22 DIAGNOSIS — M488X6 Other specified spondylopathies, lumbar region: Secondary | ICD-10-CM | POA: Diagnosis not present

## 2016-09-22 DIAGNOSIS — M5442 Lumbago with sciatica, left side: Secondary | ICD-10-CM

## 2016-09-22 DIAGNOSIS — M48061 Spinal stenosis, lumbar region without neurogenic claudication: Secondary | ICD-10-CM | POA: Insufficient documentation

## 2016-09-22 DIAGNOSIS — M47816 Spondylosis without myelopathy or radiculopathy, lumbar region: Secondary | ICD-10-CM

## 2016-09-22 DIAGNOSIS — M1990 Unspecified osteoarthritis, unspecified site: Secondary | ICD-10-CM | POA: Insufficient documentation

## 2016-09-22 DIAGNOSIS — Z5181 Encounter for therapeutic drug level monitoring: Secondary | ICD-10-CM | POA: Diagnosis not present

## 2016-09-22 DIAGNOSIS — F17210 Nicotine dependence, cigarettes, uncomplicated: Secondary | ICD-10-CM | POA: Insufficient documentation

## 2016-09-22 DIAGNOSIS — Z800 Family history of malignant neoplasm of digestive organs: Secondary | ICD-10-CM | POA: Insufficient documentation

## 2016-09-22 DIAGNOSIS — Z882 Allergy status to sulfonamides status: Secondary | ICD-10-CM | POA: Insufficient documentation

## 2016-09-22 DIAGNOSIS — K589 Irritable bowel syndrome without diarrhea: Secondary | ICD-10-CM | POA: Insufficient documentation

## 2016-09-22 DIAGNOSIS — Z79891 Long term (current) use of opiate analgesic: Secondary | ICD-10-CM | POA: Insufficient documentation

## 2016-09-22 DIAGNOSIS — Z818 Family history of other mental and behavioral disorders: Secondary | ICD-10-CM | POA: Insufficient documentation

## 2016-09-22 DIAGNOSIS — R1310 Dysphagia, unspecified: Secondary | ICD-10-CM | POA: Insufficient documentation

## 2016-09-22 DIAGNOSIS — M542 Cervicalgia: Secondary | ICD-10-CM | POA: Diagnosis not present

## 2016-09-22 DIAGNOSIS — M545 Low back pain: Secondary | ICD-10-CM | POA: Insufficient documentation

## 2016-09-22 DIAGNOSIS — M5441 Lumbago with sciatica, right side: Secondary | ICD-10-CM | POA: Diagnosis not present

## 2016-09-22 DIAGNOSIS — K219 Gastro-esophageal reflux disease without esophagitis: Secondary | ICD-10-CM | POA: Insufficient documentation

## 2016-09-22 DIAGNOSIS — Z8249 Family history of ischemic heart disease and other diseases of the circulatory system: Secondary | ICD-10-CM | POA: Diagnosis not present

## 2016-09-22 DIAGNOSIS — E785 Hyperlipidemia, unspecified: Secondary | ICD-10-CM | POA: Insufficient documentation

## 2016-09-22 DIAGNOSIS — Z888 Allergy status to other drugs, medicaments and biological substances status: Secondary | ICD-10-CM | POA: Diagnosis not present

## 2016-09-22 DIAGNOSIS — Z79899 Other long term (current) drug therapy: Secondary | ICD-10-CM | POA: Diagnosis not present

## 2016-09-22 DIAGNOSIS — G894 Chronic pain syndrome: Secondary | ICD-10-CM | POA: Insufficient documentation

## 2016-09-22 DIAGNOSIS — E7800 Pure hypercholesterolemia, unspecified: Secondary | ICD-10-CM | POA: Diagnosis not present

## 2016-09-22 DIAGNOSIS — M5416 Radiculopathy, lumbar region: Secondary | ICD-10-CM | POA: Insufficient documentation

## 2016-09-22 DIAGNOSIS — M47896 Other spondylosis, lumbar region: Secondary | ICD-10-CM | POA: Diagnosis not present

## 2016-09-22 DIAGNOSIS — Z885 Allergy status to narcotic agent status: Secondary | ICD-10-CM | POA: Insufficient documentation

## 2016-09-22 DIAGNOSIS — E559 Vitamin D deficiency, unspecified: Secondary | ICD-10-CM | POA: Insufficient documentation

## 2016-09-22 DIAGNOSIS — Z833 Family history of diabetes mellitus: Secondary | ICD-10-CM | POA: Diagnosis not present

## 2016-09-22 DIAGNOSIS — G8929 Other chronic pain: Secondary | ICD-10-CM

## 2016-09-22 DIAGNOSIS — M549 Dorsalgia, unspecified: Secondary | ICD-10-CM | POA: Diagnosis present

## 2016-09-22 DIAGNOSIS — M4696 Unspecified inflammatory spondylopathy, lumbar region: Secondary | ICD-10-CM | POA: Diagnosis not present

## 2016-09-22 NOTE — Progress Notes (Signed)
Safety precautions to be maintained throughout the outpatient stay will include: orient to surroundings, keep bed in low position, maintain call bell within reach at all times, provide assistance with transfer out of bed and ambulation.  

## 2016-09-22 NOTE — Patient Instructions (Addendum)
Call us when you need to have another procedure.  Pre procedure instructions given with teach back 3 done.  ____________________________________________________________________________________________  Preparing for Procedure with Sedation Instructions: . Oral Intake: Do not eat or drink anything for at least 8 hours prior to your procedure. . Transportation: Public transportation is not allowed. Bring an adult driver. The driver must be physically present in our waiting room before any procedure can be started. Marland Kitchen Physical Assistance: Bring an adult physically capable of assisting you, in the event you need help. This adult should keep you company at home for at least 6 hours after the procedure. . Blood Pressure Medicine: Take your blood pressure medicine with a sip of water the morning of the procedure. . Blood thinners:  . Diabetics on insulin: Notify the staff so that you can be scheduled 1st case in the morning. If your diabetes requires high dose insulin, take only  of your normal insulin dose the morning of the procedure and notify the staff that you have done so. . Preventing infections: Shower with an antibacterial soap the morning of your procedure. . Build-up your immune system: Take 1000 mg of Vitamin C with every meal (3 times a day) the day prior to your procedure. Marland Kitchen Antibiotics: Inform the staff if you have a condition or reason that requires you to take antibiotics before dental procedures. . Pregnancy: If you are pregnant, call and cancel the procedure. . Sickness: If you have a cold, fever, or any active infections, call and cancel the procedure. . Arrival: You must be in the facility at least 30 minutes prior to your scheduled procedure. . Children: Do not bring children with you. . Dress appropriately: Bring dark clothing that you would not mind if they get stained. . Valuables: Do not bring any jewelry or valuables. Procedure appointments are reserved for interventional  treatments only. Marland Kitchen No Prescription Refills. . No medication changes will be discussed during procedure appointments. . No disability issues will be discussed. ____________________________________________________________________________________________  Preparing for Procedure with Sedation Instructions: . Oral Intake: Do not eat or drink anything for at least 8 hours prior to your procedure. . Transportation: Public transportation is not allowed. Bring an adult driver. The driver must be physically present in our waiting room before any procedure can be started. Marland Kitchen Physical Assistance: Bring an adult capable of physically assisting you, in the event you need help. . Blood Pressure Medicine: Take your blood pressure medicine with a sip of water the morning of the procedure. . Insulin: Take only  of your normal insulin dose. . Preventing infections: Shower with an antibacterial soap the morning of your procedure. . Build-up your immune system: Take 1000 mg of Vitamin C with every meal (3 times a day) the day prior to your procedure. . Pregnancy: If you are pregnant, call and cancel the procedure. . Sickness: If you have a cold, fever, or any active infections, call and cancel the procedure. . Arrival: You must be in the facility at least 30 minutes prior to your scheduled procedure. . Children: Do not bring children with you. . Dress appropriately: Bring dark clothing that you would not mind if they get stained. . Valuables: Do not bring any jewelry or valuables. Procedure appointments are reserved for interventional treatments only. Marland Kitchen No Prescription Refills. . No medication changes will be discussed during procedure appointments. No disability issues will be discussed.Facet Blocks Patient Information  Description: The facets are joints in the spine between the vertebrae.  Like any  joints in the body, facets can become irritated and painful.  Arthritis can also effect the facets.  By  injecting steroids and local anesthetic in and around these joints, we can temporarily block the nerve supply to them.  Steroids act directly on irritated nerves and tissues to reduce selling and inflammation which often leads to decreased pain.  Facet blocks may be done anywhere along the spine from the neck to the low back depending upon the location of your pain.   After numbing the skin with local anesthetic (like Novocaine), a small needle is passed onto the facet joints under x-ray guidance.  You may experience a sensation of pressure while this is being done.  The entire block usually lasts about 15-25 minutes.   Conditions which may be treated by facet blocks:  Low back/buttock pain Neck/shoulder pain Certain types of headaches  Preparation for the injection:  Do not eat any solid food or dairy products within 8 hours of your appointment. You may drink clear liquid up to 3 hours before appointment.  Clear liquids include water, black coffee, juice or soda.  No milk or cream please. You may take your regular medication, including pain medications, with a sip of water before your appointment.  Diabetics should hold regular insulin (if taken separately) and take 1/2 normal NPH dose the morning of the procedure.  Carry some sugar containing items with you to your appointment. A driver must accompany you and be prepared to drive you home after your procedure. Bring all your current medications with you. An IV may be inserted and sedation may be given at the discretion of the physician. A blood pressure cuff, EKG and other monitors will often be applied during the procedure.  Some patients may need to have extra oxygen administered for a short period. You will be asked to provide medical information, including your allergies and medications, prior to the procedure.  We must know immediately if you are taking blood thinners (like Coumadin/Warfarin) or if you are allergic to IV iodine contrast  (dye).  We must know if you could possible be pregnant.  Possible side-effects:  Bleeding from needle site Infection (rare, may require surgery) Nerve injury (rare) Numbness & tingling (temporary) Difficulty urinating (rare, temporary) Spinal headache (a headache worse with upright posture) Light-headedness (temporary) Pain at injection site (serveral days) Decreased blood pressure (rare, temporary) Weakness in arm/leg (temporary) Pressure sensation in back/neck (temporary)   Call if you experience:  Fever/chills associated with headache or increased back/neck pain Headache worsened by an upright position New onset, weakness or numbness of an extremity below the injection site Hives or difficulty breathing (go to the emergency room) Inflammation or drainage at the injection site(s) Severe back/neck pain greater than usual New symptoms which are concerning to you  Please note:  Although the local anesthetic injected can often make your back or neck feel good for several hours after the injection, the pain will likely return. It takes 3-7 days for steroids to work.  You may not notice any pain relief for at least one week.  If effective, we will often do a series of 2-3 injections spaced 3-6 weeks apart to maximally decrease your pain.  After the initial series, you may be a candidate for a more permanent nerve block of the facets.  If you have any questions, please call #[phone] . [facility]

## 2016-09-22 NOTE — Progress Notes (Signed)
Patient's Name: Shelly Hanson  MRN: [id number]  Referring Provider: Vesta Mixer  DOB: 21-Sep-1960  PCP: Antionette Fairy, PA-C  DOS: 09/22/2016  Note by: Kathlen Brunswick. Dossie Arbour, MD  Service setting: Ambulatory outpatient  Specialty: Interventional Pain Management  Location: [facility] (AMB) Pain Management Facility    Patient type: Established   Primary Reason(s) for Visit: Encounter for post-procedure evaluation of chronic illness with mild to moderate exacerbation CC: Back Pain  HPI  Shelly Hanson is a 56 y.o. year old, female patient, who comes today for a post-procedure evaluation. She has Dysphagia, unspecified(787.20); High cholesterol; Perimenopausal vasomotor symptoms; Long term current use of opiate analgesic; Long term prescription opiate use; Opiate use; Encounter for therapeutic drug level monitoring; Encounter for pain management planning; Chronic low back pain (Location of Primary Source of Pain) (Midline) (Bilateral) (R>L); Chronic lower extremity pain (Location of Secondary source of pain) (Right); Chronic neck pain (Location of Tertiary source of pain) (Right); Chronic upper extremity pain (Bilateral) (R>L); Diverticulosis of colon; Anterolisthesis (L3 over L4 and L4 over L5); Lumbar facet hypertrophy (multilevel); Lumbar facet syndrome (Bilateral) (R>L); Lumbar foraminal stenosis (L4) (Left); Chronic tension-type headache, not intractable; Chronic pain syndrome; Chronic lumbar radicular pain (Location of Secondary source of pain) (Right) (L5); Marijuana use; Neurogenic pain; Osteoarthritis, multiple joints; and Musculoskeletal pain on her problem list. Her primarily concern today is the Back Pain  Pain Assessment: Self-Reported Pain Score: 0-No pain/10             Reported level is compatible with observation.       Pain Type: Chronic pain Pain Location: Back Pain Orientation: Mid Pain Descriptors / Indicators: Stabbing Pain Frequency: Rarely  Shelly Hanson comes in today for  post-procedure evaluation after the treatment done on 08/26/2016.  Further details on both, my assessment(s), as well as the proposed treatment plan, please see below.  Post-Procedure Assessment  08/26/2016 Procedure: Diagnostic bilateral lumbar facet block under fluoroscopic guidance and IV sedation Pre-procedure pain score:  5/10 Post-procedure pain score: 0/10 (100% relief) Influential Factors: BMI: 35.19 kg/m Intra-procedural challenges: None observed Assessment challenges: None detected         Post-procedural side-effects, adverse reactions, or complications: None reported Reported issues: None  Sedation: Sedation provided. When no sedatives are used, the analgesic levels obtained are directly associated to the effectiveness of the local anesthetics. However, when sedation is provided, the level of analgesia obtained during the initial 1 hour following the intervention, is believed to be the result of a combination of factors. These factors may include, but are not limited to: 1. The effectiveness of the local anesthetics used. 2. The effects of the analgesic(s) and/or anxiolytic(s) used. 3. The degree of discomfort experienced by the patient at the time of the procedure. 4. The patients ability and reliability in recalling and recording the events. 5. The presence and influence of possible secondary gains and/or psychosocial factors. Reported result: Relief experienced during the 1st hour after the procedure: 100 % (Ultra-Short Term Relief) Interpretative annotation: Analgesia during this period is likely to be Local Anesthetic and/or IV Sedative (Analgesic/Anxiolitic) related.          Effects of local anesthetic: The analgesic effects attained during this period are directly associated to the localized infiltration of local anesthetics and therefore cary significant diagnostic value as to the etiological location, or anatomical origin, of the pain. Expected duration of relief is  directly dependent on the pharmacodynamics of the local anesthetic used. Long-acting (4-6 hours) anesthetics used.  Reported result: Relief during the next 4 to 6 hour after the procedure: 100 % (Short-Term Relief) Interpretative annotation: Complete relief would suggest area to be the source of the pain.          Long-term benefit: Defined as the period of time past the expected duration of local anesthetics. With the possible exception of prolonged sympathetic blockade from the local anesthetics, benefits during this period are typically attributed to, or associated with, other factors such as analgesic sensory neuropraxia, antiinflammatory effects, or beneficial biochemical changes provided by agents other than the local anesthetics Reported result: Extended relief following procedure: 90 % (ongoing) (Long-Term Relief) Interpretative annotation: Good relief. This could suggest inflammation to be a significant component in the etiology to the pain.          Current benefits: Defined as persistent relief that continues at this point in time.   Reported results: Treated area: 90 % Shelly Hanson reports improvement in function Interpretative annotation: Ongoing benefits would suggest effective therapeutic approach  Interpretation: Results would suggest a successful diagnostic intervention.          Laboratory Chemistry  Inflammation Markers Lab Results  Component Value Date   CRP <0.8 05/12/2016   ESRSEDRATE 4 05/12/2016   (CRP: Acute Phase) (ESR: Chronic Phase) Renal Function Markers Lab Results  Component Value Date   BUN 22 (H) 05/12/2016   CREATININE 0.78 05/12/2016   GFRAA >60 05/12/2016   GFRNONAA >60 05/12/2016   Hepatic Function Markers Lab Results  Component Value Date   AST 19 05/12/2016   ALT 16 05/12/2016   ALBUMIN 4.4 05/12/2016   ALKPHOS 71 05/12/2016   Electrolytes Lab Results  Component Value Date   NA 138 05/12/2016   K 3.8 05/12/2016   CL 102 05/12/2016    CALCIUM 9.0 05/12/2016   MG 2.0 05/12/2016   Neuropathy Markers Lab Results  Component Value Date   VITAMINB12 381 05/12/2016   Bone Pathology Markers Lab Results  Component Value Date   ALKPHOS 71 05/12/2016   25OHVITD1 55 05/12/2016   25OHVITD2 51 05/12/2016   25OHVITD3 3.7 05/12/2016   CALCIUM 9.0 05/12/2016   Coagulation Parameters Lab Results  Component Value Date   PLT 222 11/01/2015   Cardiovascular Markers Lab Results  Component Value Date   HGB 14.3 11/01/2015   HCT 42.9 11/01/2015   Note: Lab results reviewed.  Recent Diagnostic Imaging Review  Dg C-arm 1-60 Min-no Report  Result Date: 08/26/2016 Fluoroscopy was utilized by the requesting physician.  No radiographic interpretation.   Note: Imaging results reviewed.          Meds  The patient has a current medication list which includes the following prescription(s): butalbital-apap-caffeine, calcium 1200, cyclobenzaprine, dicyclomine, docusate sodium, estradiol, fluticasone, gabapentin, medroxyprogesterone, meloxicam, omeprazole, ondansetron, ranitidine, rosuvastatin, tramadol, and vitamin d (ergocalciferol).  Current Outpatient Prescriptions on File Prior to Visit  Medication Sig  . Butalbital-APAP-Caffeine (CAPACET) 50-325-40 MG capsule Take 1 capsule by mouth every 6 (six) hours as needed for pain or headache.  . Calcium Carbonate-Vit D-Min (CALCIUM 1200) 1200-1000 MG-UNIT CHEW Chew by mouth.  . cyclobenzaprine (FLEXERIL) 10 MG tablet Take 1 tablet (10 mg total) by mouth 3 (three) times daily as needed for muscle spasms.  Marland Kitchen dicyclomine (BENTYL) 10 MG capsule TAKE 1 CAPSULE FOUR TIMES DAILY BEFORE MEALS  AND AT BEDTIME  . docusate sodium (COLACE) 100 MG capsule Take 100 mg by mouth daily.  Marland Kitchen estradiol (ESTRACE) 0.5 MG tablet Take 1 tablet (0.5  mg total) by mouth daily.  . fluticasone (VERAMYST) 27.5 MCG/SPRAY nasal spray Place 2 sprays into the nose daily.  Marland Kitchen gabapentin (NEURONTIN) 300 MG capsule Take 1-3  capsules (300-900 mg total) by mouth 3 (three) times daily. Follow titration schedule.  . medroxyPROGESTERone (PROVERA) 5 MG tablet TAKE 1 TABLET ONCE DAILY FOR 2 WEEKS. REPEAT AS DIRECTED EVERY THIRD MONTH.  . meloxicam (MOBIC) 15 MG tablet Take 1 tablet (15 mg total) by mouth daily.  Marland Kitchen omeprazole (PRILOSEC) 40 MG capsule Take 40 mg by mouth daily.  . ondansetron (ZOFRAN ODT) 4 MG disintegrating tablet Take 1 tablet (4 mg total) by mouth every 6 (six) hours as needed for nausea.  . ranitidine (ZANTAC) 150 MG tablet Take 150 mg by mouth 2 (two) times daily.  . rosuvastatin (CRESTOR) 20 MG tablet Take 20 mg by mouth daily.  . traMADol (ULTRAM) 50 MG tablet TAKE 1 TABLET EVERY 6 HOURS AS NEEDED FOR MODERATE OR SEVERE PAIN  . Vitamin D, Ergocalciferol, (DRISDOL) 50000 UNITS CAPS capsule Take 50,000 Units by mouth every 7 (seven) days. Takes on Saturdays.   No current facility-administered medications on file prior to visit.    ROS  Constitutional: Denies any fever or chills Gastrointestinal: No reported hemesis, hematochezia, vomiting, or acute GI distress Musculoskeletal: Denies any acute onset joint swelling, redness, loss of ROM, or weakness Neurological: No reported episodes of acute onset apraxia, aphasia, dysarthria, agnosia, amnesia, paralysis, loss of coordination, or loss of consciousness  Allergies  Ms. Barbara is allergic to codeine; flagyl [metronidazole]; naproxen; and sulfa antibiotics.  [place]  Drug: Ms. Grandberry  reports that she does not use drugs. Alcohol:  reports that she does not drink alcohol. Tobacco:  reports that she has been smoking Cigarettes.  She has a 2.50 pack-year smoking history. She has never used smokeless tobacco. Medical:  has a past medical history of Abdominal wall pain in right lower quadrant (09/04/2014); GERD (gastroesophageal reflux disease); High cholesterol; Hyperlipemia; IBS (irritable bowel syndrome); Migraine headache (12/18/2014); Pinched nerve; Pinched  nerve in neck; Sciatic leg pain; and Vitamin D deficiency disease. Family: family history includes Cancer in her father; Colon cancer in her father; Depression in her mother; Diabetes in her mother; Heart disease in her brother; Hyperlipidemia in her brother, brother, and mother; Hypertension in her father; Seizures in her father.  Past Surgical History:  Procedure Laterality Date  . BALLOON DILATION N/A 11/17/2013   Procedure: BALLOON DILATION;  Surgeon: Rogene Houston, MD;  [facility];  Service: Endoscopy;  Laterality: N/A;  . CHOLECYSTECTOMY    . COLONOSCOPY N/A 11/17/2013   Procedure: COLONOSCOPY;  Surgeon: Rogene Houston, MD;  Location: [facility];  Service: Endoscopy;  Laterality: N/A;  1030  . DILATION AND CURETTAGE OF UTERUS     x 2   . ESOPHAGOGASTRODUODENOSCOPY N/A 11/17/2013   Procedure: ESOPHAGOGASTRODUODENOSCOPY (EGD);  Surgeon: Rogene Houston, MD;  [facility]: [facility];  Service: Endoscopy;  Laterality: N/A;  . FOOT SURGERY     for a fx.   Marland Kitchen [name] N/A 11/17/2013   Procedure: Venia Minks DILATION;  Surgeon: Rogene Houston, MD;  [facility]: [facility];  Service: Endoscopy;  Laterality: N/A;  . SAVORY DILATION N/A 11/17/2013   Procedure: SAVORY DILATION;  Surgeon: Rogene Houston, MD;  [facility]: [facility];  Service: Endoscopy;  Laterality: N/A;   Constitutional Exam  General appearance: Well nourished, well developed, and well hydrated. In no apparent acute distress Vitals:  09/22/16 1058  BP: 121/76  Pulse: 84  Resp: 16  Temp: 98.4 F (36.9 C)  SpO2: 98%  Weight: 218 lb (98.9 kg)  Height: '5\' 6"'  (1.676 m)   BMI Assessment: Estimated body mass index is 35.19 kg/m as calculated from the following:   Height as of this encounter: '5\' 6"'  (1.676 m).   Weight as of this encounter: 218 lb (98.9 kg).  BMI interpretation table: BMI level Category Range association with higher incidence of chronic pain  <18 kg/m2 Underweight   18.5-24.9 kg/m2  Ideal body weight   25-29.9 kg/m2 Overweight Increased incidence by 20%  30-34.9 kg/m2 Obese (Class I) Increased incidence by 68%  35-39.9 kg/m2 Severe obesity (Class II) Increased incidence by 136%  >40 kg/m2 Extreme obesity (Class III) Increased incidence by 254%   BMI Readings from Last 4 Encounters:  09/22/16 35.19 kg/m  08/26/16 36.28 kg/m  07/27/16 36.28 kg/m  05/12/16 34.95 kg/m   Wt Readings from Last 4 Encounters:  09/22/16 218 lb (98.9 kg)  08/26/16 218 lb (98.9 kg)  07/27/16 218 lb (98.9 kg)  05/12/16 210 lb (95.3 kg)  Psych/Mental status: Alert, oriented x 3 (person, place, & time)       Eyes: PERLA Respiratory: No evidence of acute respiratory distress  Cervical Spine Exam  Inspection: No masses, redness, or swelling Alignment: Symmetrical Functional ROM: Unrestricted ROM      Stability: No instability detected Muscle strength & Tone: Functionally intact Sensory: Unimpaired Palpation: No palpable anomalies              Upper Extremity (UE) Exam    Side: Right upper extremity  Side: Left upper extremity  Inspection: No masses, redness, swelling, or asymmetry. No contractures  Inspection: No masses, redness, swelling, or asymmetry. No contractures  Functional ROM: Unrestricted ROM          Functional ROM: Unrestricted ROM          Muscle strength & Tone: Functionally intact  Muscle strength & Tone: Functionally intact  Sensory: Unimpaired  Sensory: Unimpaired  Palpation: No palpable anomalies              Palpation: No palpable anomalies              Specialized Test(s): Deferred         Specialized Test(s): Deferred          Thoracic Spine Exam  Inspection: No masses, redness, or swelling Alignment: Symmetrical Functional ROM: Unrestricted ROM Stability: No instability detected Sensory: Unimpaired Muscle strength & Tone: No palpable anomalies  Lumbar Spine Exam  Inspection: No masses, redness, or swelling Alignment: Symmetrical Functional ROM:  Improved after treatment      Stability: No instability detected Muscle strength & Tone: Functionally intact Sensory: Unimpaired Palpation: No palpable anomalies       Provocative Tests: Lumbar Hyperextension and rotation test: evaluation deferred today       Patrick's Maneuver: evaluation deferred today                    Gait & Posture Assessment  Ambulation: Unassisted Gait: Relatively normal for age and body habitus Posture: WNL   Lower Extremity Exam    Side: Right lower extremity  Side: Left lower extremity  Inspection: No masses, redness, swelling, or asymmetry. No contractures  Inspection: No masses, redness, swelling, or asymmetry. No contractures  Functional ROM: Unrestricted ROM          Functional ROM: Unrestricted ROM  Muscle strength & Tone: Functionally intact  Muscle strength & Tone: Functionally intact  Sensory: Unimpaired  Sensory: Unimpaired  Palpation: No palpable anomalies  Palpation: No palpable anomalies   Assessment  Primary Diagnosis & Pertinent Problem List: The primary encounter diagnosis was Lumbar facet syndrome (Bilateral) (R>L). Diagnoses of Lumbar facet hypertrophy (multilevel) and Chronic low back pain (Location of Primary Source of Pain) (Midline) (Bilateral) (R>L) were also pertinent to this visit.  Status Diagnosis  Controlled Controlled Controlled 1. Lumbar facet syndrome (Bilateral) (R>L)   2. Lumbar facet hypertrophy (multilevel)   3. Chronic low back pain (Location of Primary Source of Pain) (Midline) (Bilateral) (R>L)     Problems updated and reviewed during this visit: No problems updated. Plan of Care  Pharmacotherapy (Medications Ordered): No orders of the defined types were placed in this encounter.  New Prescriptions   No medications on file   Medications administered today: Ms. Samano had no medications administered during this visit. Lab-work, procedure(s), and/or referral(s): Orders Placed This Encounter   Procedures  . LUMBAR FACET(MEDIAL BRANCH NERVE BLOCK) MBNB   Imaging and/or referral(s): None  Interventional therapies: Planned, scheduled, and/or pending:   Not at this time.   Considering:   Diagnostic bilateral-sided lumbar facet block #2 Possible bilateral-sided lumbar facet RFA  Diagnostic Right L4-5 lumbar epidural steroid injection  Diagnostic Right cervical epidural steroid injection    Palliative PRN treatment(s):   Diagnostic bilateral lumbar facet block #2   Provider-requested follow-up: Return for PRN procedure(s), by MD.  No future appointments. Primary Care Physician: Vesta Mixer Location: [facility] Management Facility Note by: Kathlen Brunswick Dossie Arbour, M.D, DABA, DABAPM, DABPM, DABIPP, FIPP Date: 09/22/2016; Time: 12:02 PM  Patient instructions provided during this appointment: Patient Instructions  Call us when you need to have another procedure.  Pre procedure instructions given with teach back 3 done.  ____________________________________________________________________________________________  Preparing for Procedure with Sedation Instructions: . Oral Intake: Do not eat or drink anything for at least 8 hours prior to your procedure. . Transportation: Public transportation is not allowed. Bring an adult driver. The driver must be physically present in our waiting room before any procedure can be started. Marland Kitchen Physical Assistance: Bring an adult physically capable of assisting you, in the event you need help. This adult should keep you company at home for at least 6 hours after the procedure. . Blood Pressure Medicine: Take your blood pressure medicine with a sip of water the morning of the procedure. . Blood thinners:  . Diabetics on insulin: Notify the staff so that you can be scheduled 1st case in the morning. If your diabetes requires high dose insulin, take only  of your normal insulin dose the morning of the procedure and notify the staff  that you have done so. . Preventing infections: Shower with an antibacterial soap the morning of your procedure. . Build-up your immune system: Take 1000 mg of Vitamin C with every meal (3 times a day) the day prior to your procedure. Marland Kitchen Antibiotics: Inform the staff if you have a condition or reason that requires you to take antibiotics before dental procedures. . Pregnancy: If you are pregnant, call and cancel the procedure. . Sickness: If you have a cold, fever, or any active infections, call and cancel the procedure. . Arrival: You must be in the facility at least 30 minutes prior to your scheduled procedure. . Children: Do not bring children with you. . Dress appropriately: Bring dark clothing that you would not  mind if they get stained. . Valuables: Do not bring any jewelry or valuables. Procedure appointments are reserved for interventional treatments only. Marland Kitchen No Prescription Refills. . No medication changes will be discussed during procedure appointments. . No disability issues will be discussed. ____________________________________________________________________________________________  Preparing for Procedure with Sedation Instructions: . Oral Intake: Do not eat or drink anything for at least 8 hours prior to your procedure. . Transportation: Public transportation is not allowed. Bring an adult driver. The driver must be physically present in our waiting room before any procedure can be started. Marland Kitchen Physical Assistance: Bring an adult capable of physically assisting you, in the event you need help. . Blood Pressure Medicine: Take your blood pressure medicine with a sip of water the morning of the procedure. . Insulin: Take only  of your normal insulin dose. . Preventing infections: Shower with an antibacterial soap the morning of your procedure. . Build-up your immune system: Take 1000 mg of Vitamin C with every meal (3 times a day) the day prior to your procedure. . Pregnancy: If  you are pregnant, call and cancel the procedure. . Sickness: If you have a cold, fever, or any active infections, call and cancel the procedure. . Arrival: You must be in the facility at least 30 minutes prior to your scheduled procedure. . Children: Do not bring children with you. . Dress appropriately: Bring dark clothing that you would not mind if they get stained. . Valuables: Do not bring any jewelry or valuables. Procedure appointments are reserved for interventional treatments only. Marland Kitchen No Prescription Refills. . No medication changes will be discussed during procedure appointments. No disability issues will be discussed.Facet Blocks Patient Information  Description: The facets are joints in the spine between the vertebrae.  Like any joints in the body, facets can become irritated and painful.  Arthritis can also effect the facets.  By injecting steroids and local anesthetic in and around these joints, we can temporarily block the nerve supply to them.  Steroids act directly on irritated nerves and tissues to reduce selling and inflammation which often leads to decreased pain.  Facet blocks may be done anywhere along the spine from the neck to the low back depending upon the location of your pain.   After numbing the skin with local anesthetic (like Novocaine), a small needle is passed onto the facet joints under x-ray guidance.  You may experience a sensation of pressure while this is being done.  The entire block usually lasts about 15-25 minutes.   Conditions which may be treated by facet blocks:  Low back/buttock pain Neck/shoulder pain Certain types of headaches  Preparation for the injection:  Do not eat any solid food or dairy products within 8 hours of your appointment. You may drink clear liquid up to 3 hours before appointment.  Clear liquids include water, black coffee, juice or soda.  No milk or cream please. You may take your regular medication, including pain medications,  with a sip of water before your appointment.  Diabetics should hold regular insulin (if taken separately) and take 1/2 normal NPH dose the morning of the procedure.  Carry some sugar containing items with you to your appointment. A driver must accompany you and be prepared to drive you home after your procedure. Bring all your current medications with you. An IV may be inserted and sedation may be given at the discretion of the physician. A blood pressure cuff, EKG and other monitors will often be applied during the procedure.  Some patients may need to have extra oxygen administered for a short period. You will be asked to provide medical information, including your allergies and medications, prior to the procedure.  We must know immediately if you are taking blood thinners (like Coumadin/Warfarin) or if you are allergic to IV iodine contrast (dye).  We must know if you could possible be pregnant.  Possible side-effects:  Bleeding from needle site Infection (rare, may require surgery) Nerve injury (rare) Numbness & tingling (temporary) Difficulty urinating (rare, temporary) Spinal headache (a headache worse with upright posture) Light-headedness (temporary) Pain at injection site (serveral days) Decreased blood pressure (rare, temporary) Weakness in arm/leg (temporary) Pressure sensation in back/neck (temporary)   Call if you experience:  Fever/chills associated with headache or increased back/neck pain Headache worsened by an upright position New onset, weakness or numbness of an extremity below the injection site Hives or difficulty breathing (go to the emergency room) Inflammation or drainage at the injection site(s) Severe back/neck pain greater than usual New symptoms which are concerning to you  Please note:  Although the local anesthetic injected can often make your back or neck feel good for several hours after the injection, the pain will likely return. It takes 3-7 days  for steroids to work.  You may not notice any pain relief for at least one week.  If effective, we will often do a series of 2-3 injections spaced 3-6 weeks apart to maximally decrease your pain.  After the initial series, you may be a candidate for a more permanent nerve block of the facets.  If you have any questions, please call #[phone] . [facility]

## 2016-10-08 ENCOUNTER — Encounter (INDEPENDENT_AMBULATORY_CARE_PROVIDER_SITE_OTHER): Payer: Self-pay | Admitting: Internal Medicine

## 2016-11-02 ENCOUNTER — Ambulatory Visit (INDEPENDENT_AMBULATORY_CARE_PROVIDER_SITE_OTHER): Payer: Medicare HMO | Admitting: Internal Medicine

## 2016-11-16 ENCOUNTER — Other Ambulatory Visit: Payer: Self-pay | Admitting: Pain Medicine

## 2016-11-16 DIAGNOSIS — M7918 Myalgia, other site: Secondary | ICD-10-CM

## 2016-11-17 ENCOUNTER — Encounter: Payer: Self-pay | Admitting: *Deleted

## 2016-11-24 ENCOUNTER — Telehealth: Payer: Self-pay | Admitting: Pain Medicine

## 2016-11-24 NOTE — Telephone Encounter (Signed)
Patient is doing well with only occasional spasm, out of flexiril, does Dr. Laban EmperorNaveira want her to continue with the flexiril or no? She is ok without it. Please call patient and let her know

## 2016-11-24 NOTE — Telephone Encounter (Signed)
Spoke with patient re; medication, Flexeril and whether she needs to continue taking when she is not having muscle spasms.  Explained that this is a prn medication and she only needs to take it if she needs it.  I did explain to patient that if she needs refills on medications she would need to have an appt with Dr Laban EmperorNaveira.  Patient verbalizes u/o information, does have a prn procedure order in place and is aware of this and will call to schedule if needed.

## 2016-11-30 ENCOUNTER — Ambulatory Visit (INDEPENDENT_AMBULATORY_CARE_PROVIDER_SITE_OTHER): Payer: Medicare HMO | Admitting: Internal Medicine

## 2016-12-23 ENCOUNTER — Encounter: Payer: Self-pay | Admitting: Obstetrics and Gynecology

## 2016-12-23 ENCOUNTER — Ambulatory Visit (INDEPENDENT_AMBULATORY_CARE_PROVIDER_SITE_OTHER): Payer: Medicare HMO | Admitting: Obstetrics and Gynecology

## 2016-12-23 DIAGNOSIS — N951 Menopausal and female climacteric states: Secondary | ICD-10-CM

## 2016-12-23 DIAGNOSIS — G8929 Other chronic pain: Secondary | ICD-10-CM | POA: Insufficient documentation

## 2016-12-23 DIAGNOSIS — G44321 Chronic post-traumatic headache, intractable: Secondary | ICD-10-CM | POA: Diagnosis not present

## 2016-12-23 DIAGNOSIS — R51 Headache: Secondary | ICD-10-CM

## 2016-12-23 MED ORDER — MEDROXYPROGESTERONE ACETATE 5 MG PO TABS: ORAL_TABLET | ORAL | 0 refills | Status: DC

## 2016-12-23 MED ORDER — ESTRADIOL 1 MG PO TABS: 1.0000 mg | ORAL_TABLET | Freq: Every day | ORAL | 3 refills | Status: DC

## 2016-12-23 MED ORDER — TRAMADOL HCL 50 MG PO TABS: ORAL_TABLET | ORAL | 1 refills | Status: DC

## 2016-12-23 NOTE — Progress Notes (Signed)
Patient ID: Shelly Hanson, female   DOB: 04/01/1961, 56 y.o.   MRN: [id number]   [facility]  @DATE @            Patient name: Shelly Hanson MRN [id number]  Date of birth: 03/15/1961  CC & HPI:  Shelly Hanson is a 56 y.o. female presenting today for a discussion in regard to her estradiol and provera medications. Pt reports that she has had an increase in her hot flashes and has recently tried 2 estradiol tablets daily with minimal relief. Pt states that she also would like a refill of her tramadol Rx that she uses for her migraines. Pt PCP Emilio MathJenny Baucom, PA-C [facility]. She denies her PCP managing her migraines at this time.    ROS:  ROS  No complaints All systems are negative except as noted in the HPI and PMH.  Pertinent History Reviewed:   Reviewed: Significant for migraine headache Medical         Past Medical History:  Diagnosis Date  . Abdominal wall pain in right lower quadrant 09/04/2014  . GERD (gastroesophageal reflux disease)   . High cholesterol   . Hyperlipemia   . IBS (irritable bowel syndrome)   . Lumbar facet joint syndrome (HCC)   . Migraine headache 12/18/2014  . Pinched nerve    pinched nerve in back   . Pinched nerve in neck   . Sciatic leg pain   . Vitamin D deficiency disease                               Surgical Hx:    Past Surgical History:  Procedure Laterality Date  . BALLOON DILATION N/A 11/17/2013   Procedure: BALLOON DILATION;  Surgeon: Malissa HippoNajeeb U Rehman, MD;  [facility];  Service: Endoscopy;  Laterality: N/A;  . CHOLECYSTECTOMY    . COLONOSCOPY N/A 11/17/2013   Procedure: COLONOSCOPY;  Surgeon: Malissa HippoNajeeb U Rehman, MD;  [facility];  Service: Endoscopy;  Laterality: N/A;  1030  . DILATION AND CURETTAGE OF UTERUS     x 2   . ESOPHAGOGASTRODUODENOSCOPY N/A 11/17/2013   Procedure: ESOPHAGOGASTRODUODENOSCOPY (EGD);  Surgeon: Malissa HippoNajeeb U Rehman, MD;  [facility];  Service: Endoscopy;  Laterality: N/A;  . FOOT SURGERY     for a fx.   Marland Kitchen. [name] N/A 11/17/2013   Procedure: Elease HashimotoMALONEY DILATION;  Surgeon: Malissa HippoNajeeb U Rehman, MD;  [facility];  Service: Endoscopy;  Laterality: N/A;  . SAVORY DILATION N/A 11/17/2013   Procedure: SAVORY DILATION;  Surgeon: Malissa HippoNajeeb U Rehman, MD;  [facility];  Service: Endoscopy;  Laterality: N/A;   Medications: Reviewed & Updated - see associated section                       Current Outpatient Prescriptions:  .  Butalbital-APAP-Caffeine (CAPACET) 50-325-40 MG capsule, Take 1 capsule by mouth every 6 (six) hours as needed for pain or headache., Disp: 90 capsule, Rfl: 2 .  Calcium Carbonate-Vit D-Min (CALCIUM 1200) 1200-1000 MG-UNIT CHEW, Chew by mouth., Disp: , Rfl:  .  dicyclomine (BENTYL) 10 MG capsule, TAKE 1 CAPSULE FOUR TIMES DAILY BEFORE MEALS  AND AT BEDTIME, Disp: 360 capsule, Rfl: 3 .  docusate sodium (COLACE) 100 MG capsule, Take 100 mg by mouth daily., Disp: , Rfl:  .  fluticasone (  VERAMYST) 27.5 MCG/SPRAY nasal spray, Place 2 sprays into the nose daily., Disp: , Rfl:  .  medroxyPROGESTERone (PROVERA) 5 MG tablet, TAKE 1 TABLET ONCE DAILY FOR 2 WEEKS. REPEAT AS DIRECTED EVERY THIRD MONTH., Disp: 14 tablet, Rfl: 0 .  omeprazole (PRILOSEC) 40 MG capsule, Take 40 mg by mouth daily., Disp: , Rfl:  .  ondansetron (ZOFRAN ODT) 4 MG disintegrating tablet, Take 1 tablet (4 mg total) by mouth every 6 (six) hours as needed for nausea. (Patient taking differently: Take 8 mg by mouth every 8 (eight) hours as needed for nausea. ), Disp: 20 tablet, Rfl: 0 .  ranitidine (ZANTAC) 150 MG tablet, Take 150 mg by mouth 2 (two) times daily., Disp: , Rfl:  .  rosuvastatin (CRESTOR) 20 MG tablet, Take 20 mg by mouth daily., Disp: , Rfl:  .  traMADol (ULTRAM) 50 MG tablet, TAKE 1 TABLET EVERY 6 HOURS AS NEEDED FOR MODERATE OR SEVERE PAIN, Disp: 30 tablet, Rfl: 0 .  Vitamin D, Ergocalciferol, (DRISDOL) 50000  UNITS CAPS capsule, Take 50,000 Units by mouth every 7 (seven) days. Takes on Saturdays., Disp: , Rfl:  .  cyclobenzaprine (FLEXERIL) 10 MG tablet, Take 1 tablet (10 mg total) by mouth 3 (three) times daily as needed for muscle spasms., Disp: 90 tablet, Rfl: 0 .  estradiol (ESTRACE) 0.5 MG tablet, Take 1 tablet (0.5 mg total) by mouth daily., Disp: 30 tablet, Rfl: 11 .  gabapentin (NEURONTIN) 300 MG capsule, Take 1-3 capsules (300-900 mg total) by mouth 3 (three) times daily. Follow titration schedule., Disp: 270 capsule, Rfl: 2 .  meloxicam (MOBIC) 15 MG tablet, Take 1 tablet (15 mg total) by mouth daily., Disp: 30 tablet, Rfl: 2   Social History: Reviewed -  reports that she has been smoking Cigarettes.  She has a 2.50 pack-year smoking history. She has never used smokeless tobacco.  Objective Findings:  Vitals: Blood pressure 100/60, pulse 63, weight 210 lb 3.2 oz (95.3 kg).  Physical Examination: discussion only   Discussion: 1. Discussed with pt risks and benefits of medication management  At end of discussion, pt had opportunity to ask questions and has no further questions at this time.   Specific discussion of medication management as noted above. Greater than 50% was spent in counseling, documentation, and coordination of care with the patient.   Total time greater than: 25 minutes.   Assessment & Plan:   A:  1. Migraine HA's stable on fioricet and occasional tramadol, zofran prn 2. Vasomotor symptoms due to menopause  P:  1. Refill 1 mg estradiol 2. Prescribe and use provera q 3 months 3. Refill tramadol  4. Follow up in 1 year or PRN  By signing my name below, I, Soijett Blue, attest that this documentation has been prepared under the direction and in the presence of Tilda Burrow, MD. Electronically Signed: Soijett Blue, ED Scribe. 12/23/16. 2:13 PM.  I personally performed the services described in this documentation, which was SCRIBED in my presence. The  recorded information has been reviewed and considered accurate. It has been edited as necessary during review. Tilda Burrow, MD

## 2017-01-05 ENCOUNTER — Other Ambulatory Visit: Payer: Self-pay | Admitting: Obstetrics and Gynecology

## 2017-01-05 DIAGNOSIS — Z1231 Encounter for screening mammogram for malignant neoplasm of breast: Secondary | ICD-10-CM

## 2017-01-22 ENCOUNTER — Ambulatory Visit (HOSPITAL_COMMUNITY)
Admission: RE | Admit: 2017-01-22 | Discharge: 2017-01-22 | Disposition: A | Discharge status: Home / Self Care | Payer: Medicare HMO | Source: Ambulatory Visit | Attending: Obstetrics and Gynecology | Admitting: Obstetrics and Gynecology

## 2017-01-22 DIAGNOSIS — Z1231 Encounter for screening mammogram for malignant neoplasm of breast: Secondary | ICD-10-CM | POA: Diagnosis not present

## 2017-02-08 ENCOUNTER — Ambulatory Visit (INDEPENDENT_AMBULATORY_CARE_PROVIDER_SITE_OTHER): Payer: Medicare HMO | Admitting: Internal Medicine

## 2017-02-08 ENCOUNTER — Encounter (INDEPENDENT_AMBULATORY_CARE_PROVIDER_SITE_OTHER): Payer: Self-pay | Admitting: Internal Medicine

## 2017-02-08 VITALS — BP 120/86 | HR 60 | Temp 98.3°F | Ht 65.5 in | Wt 216.7 lb

## 2017-02-08 DIAGNOSIS — K589 Irritable bowel syndrome without diarrhea: Secondary | ICD-10-CM | POA: Diagnosis not present

## 2017-02-08 DIAGNOSIS — K219 Gastro-esophageal reflux disease without esophagitis: Secondary | ICD-10-CM

## 2017-02-08 NOTE — Progress Notes (Signed)
Subjective:    Patient ID: Shelly Hanson, female    DOB: 12/20/1960, 56 y.o.   MRN: [id number]  HPI Here today for f/u. Last seen in July of 2017. Hx of dysphagia.  EGD/ED/colonoscopy in 2015.  Hx of chronic GERD.  Hx of IBS and maintained of Dicyclomine. Her appetite is good. She has had weight loss. She changed her diet. Has a BM every 2 day . Uses stool softener 3 times a week. No melena or BRRB.      11/17/2013 EGD, ED & Colonoscopy  Indications: The patient is a 56 year old Caucasian female with chronic GERD and history of esophageal stricture now presents with solid dysphagia. Heartburn is well controlled with PPI. She is also undergoing screening colonoscopy. Family history significant for CRC in father at age 58 and died of metastatic disease 5 years later.  EGD findings; Soft stricture at GE junction without changes of esophagitis. This stricture was dilated with balloon dilator to 18 mm. Moderate size sliding hiatal hernia. Erosive antral gastritis  Colonoscopy findings;  Review of Systems Past Medical History:  Diagnosis Date  . Abdominal wall pain in right lower quadrant 09/04/2014  . GERD (gastroesophageal reflux disease)   . High cholesterol   . Hyperlipemia   . IBS (irritable bowel syndrome)   . Lumbar facet joint syndrome   . Migraine headache 12/18/2014  . Pinched nerve    pinched nerve in back   . Pinched nerve in neck   . Sciatic leg pain   . Vitamin D deficiency disease     Past Surgical History:  Procedure Laterality Date  . BALLOON DILATION N/A 11/17/2013   Procedure: BALLOON DILATION;  Surgeon: Malissa HippoNajeeb U Rehman, MD;  [facility];  Service: Endoscopy;  Laterality: N/A;  . CHOLECYSTECTOMY    . COLONOSCOPY N/A 11/17/2013   Procedure: COLONOSCOPY;  Surgeon: Malissa HippoNajeeb U Rehman, MD;  [facility];  Service:  Endoscopy;  Laterality: N/A;  1030  . DILATION AND CURETTAGE OF UTERUS     x 2   . ESOPHAGOGASTRODUODENOSCOPY N/A 11/17/2013   Procedure: ESOPHAGOGASTRODUODENOSCOPY (EGD);  Surgeon: Malissa HippoNajeeb U Rehman, MD;  [facility];  Service: Endoscopy;  Laterality: N/A;  . FOOT SURGERY     for a fx.   Marland Kitchen. [name] N/A 11/17/2013   Procedure: Elease HashimotoMALONEY DILATION;  Surgeon: Malissa HippoNajeeb U Rehman, MD;  [facility];  Service: Endoscopy;  Laterality: N/A;  . SAVORY DILATION N/A 11/17/2013   Procedure: SAVORY DILATION;  Surgeon: Malissa HippoNajeeb U Rehman, MD;  [facility];  Service: Endoscopy;  Laterality: N/A;    Allergies  Allergen Reactions  . Codeine   . Flagyl [Metronidazole]   . Naproxen   . Sulfa Antibiotics     Current Outpatient Prescriptions on File Prior to Visit  Medication Sig Dispense Refill  . Butalbital-APAP-Caffeine (CAPACET) 50-325-40 MG capsule Take 1 capsule by mouth every 6 (six) hours as needed for pain or headache. 90 capsule 2  . Calcium Carbonate-Vit D-Min (CALCIUM 1200) 1200-1000 MG-UNIT CHEW Chew by mouth.    . dicyclomine (BENTYL) 10 MG capsule TAKE 1 CAPSULE FOUR TIMES DAILY BEFORE MEALS  AND AT BEDTIME 360 capsule 3  . estradiol (ESTRACE) 1 MG tablet Take 1 tablet (1 mg total) by mouth daily. 90 tablet 3  . fluticasone (VERAMYST) 27.5 MCG/SPRAY nasal spray Place 2 sprays into the nose daily.    . medroxyPROGESTERone (PROVERA) 5 MG tablet TAKE 1 TABLET ONCE DAILY FOR  2 WEEKS. REPEAT AS DIRECTED EVERY THIRD MONTH. 14 tablet 0  . omeprazole (PRILOSEC) 40 MG capsule Take 40 mg by mouth daily.    . ondansetron (ZOFRAN ODT) 4 MG disintegrating tablet Take 1 tablet (4 mg total) by mouth every 6 (six) hours as needed for nausea. (Patient taking differently: Take 8 mg by mouth every 8 (eight) hours as needed for nausea. ) 20 tablet 0  . ranitidine (ZANTAC) 150 MG tablet Take 150 mg by mouth 2 (two) times daily.    . rosuvastatin (CRESTOR) 20 MG tablet Take 20 mg by  mouth daily.    . traMADol (ULTRAM) 50 MG tablet TAKE 1 TABLET EVERY 6 HOURS AS NEEDED FOR MODERATE OR SEVERE PAIN 30 tablet 1  . Vitamin D, Ergocalciferol, (DRISDOL) 50000 UNITS CAPS capsule Take 50,000 Units by mouth every 7 (seven) days. Takes on Saturdays.    . cyclobenzaprine (FLEXERIL) 10 MG tablet Take 1 tablet (10 mg total) by mouth 3 (three) times daily as needed for muscle spasms. 90 tablet 0  . docusate sodium (COLACE) 100 MG capsule Take 100 mg by mouth daily.    Marland Kitchen gabapentin (NEURONTIN) 300 MG capsule Take 1-3 capsules (300-900 mg total) by mouth 3 (three) times daily. Follow titration schedule. 270 capsule 2  . meloxicam (MOBIC) 15 MG tablet Take 1 tablet (15 mg total) by mouth daily. 30 tablet 2   No current facility-administered medications on file prior to visit.         Objective:   Physical Exam Blood pressure 120/86, pulse 60, temperature 98.3 F (36.8 C), height 5' 5.5" (1.664 m), weight 216 lb 11.2 oz (98.3 kg). Alert and oriented. Skin warm and dry. Oral mucosa is moist.   . Sclera anicteric, conjunctivae is pink. Thyroid not enlarged. No cervical lymphadenopathy. Lungs clear. Heart regular rate and rhythm.  Abdomen is soft. Bowel sounds are positive. No hepatomegaly. No abdominal masses felt. No tenderness.  No edema to lower extremities.           Assessment & Plan:  GERD. Continue the Omeprazole.  IBS: Continue the Dicyclomine.  OV in 1 year.

## 2017-02-08 NOTE — Patient Instructions (Signed)
Continue the Dicyclomine and Omeprazole. OV in one year.

## 2017-02-10 ENCOUNTER — Telehealth: Payer: Self-pay | Admitting: Obstetrics and Gynecology

## 2017-02-10 NOTE — Telephone Encounter (Signed)
Left message x 1. I haven't seen anything from pharmacy. JSY

## 2017-02-10 NOTE — Telephone Encounter (Signed)
Patient called stating that her pharmacy has faxed over a mediction refill request for her headache and she would liek to know if we could have a provider sign it and fax it back. Please contact pt

## 2017-02-11 ENCOUNTER — Other Ambulatory Visit: Payer: Self-pay | Admitting: Obstetrics & Gynecology

## 2017-02-11 NOTE — Telephone Encounter (Signed)
Spoke with pt. Pt is requesting a refill on Capacet for headaches. Please advise. Thanks!! JSY

## 2017-02-11 NOTE — Telephone Encounter (Signed)
Left message x 2. JSY 

## 2017-02-15 ENCOUNTER — Telehealth: Payer: Self-pay | Admitting: *Deleted

## 2017-02-16 ENCOUNTER — Other Ambulatory Visit: Payer: Self-pay | Admitting: *Deleted

## 2017-02-16 MED ORDER — BUTALBITAL-APAP-CAFFEINE 50-325-40 MG PO CAPS: 1.0000 | ORAL_CAPSULE | Freq: Four times a day (QID) | ORAL | 2 refills | Status: DC | PRN

## 2017-02-16 NOTE — Telephone Encounter (Signed)
LMOVM that medication was refilled and [facility].

## 2017-02-22 ENCOUNTER — Telehealth: Payer: Self-pay | Admitting: *Deleted

## 2017-02-22 ENCOUNTER — Other Ambulatory Visit: Payer: Self-pay | Admitting: Obstetrics and Gynecology

## 2017-02-22 NOTE — Telephone Encounter (Signed)
Pt callled stating that she could not afford the butabital-apap-caffeine prescription that she was given. She is asking if there is anything cheaper that can be prescribed. She states that she is also taking tramadol for her headaches. Informed pt that I would send her request to Dr Emelda FearFerguson and she should check with her pharmacy in the next 24 hours.

## 2017-02-25 ENCOUNTER — Other Ambulatory Visit: Payer: Self-pay | Admitting: Obstetrics and Gynecology

## 2017-02-25 MED ORDER — TRAMADOL HCL 50 MG PO TABS: ORAL_TABLET | ORAL | 3 refills | Status: DC

## 2017-02-25 NOTE — Progress Notes (Unsigned)
Tramadol refilled.

## 2017-04-13 IMAGING — CT CT ABD-PELV W/ CM
2 of 8 series · 14 of 46 positions shown, 19 images · IV contrast (Omnipaque 300)
Comparison: None.

CLINICAL DATA: Upper abdominal pain for 2 weeks.

EXAM:
CT ABDOMEN AND PELVIS WITH CONTRAST
TECHNIQUE: Multidetector CT imaging of the abdomen and pelvis was performed
using the standard protocol following bolus administration of
intravenous contrast.
CONTRAST:  25mL OMNIPAQUE IOHEXOL 300 MG/ML SOLN, 100mL OMNIPAQUE
IOHEXOL 300 MG/ML SOLN

[Series 2: abd_pel_with 5.0 b40f · axial · 0.72mm/px · z∈[-430,-10]mm · 11 of 97 slices shown, 16 images]
[im 7/97  soft-tissue]
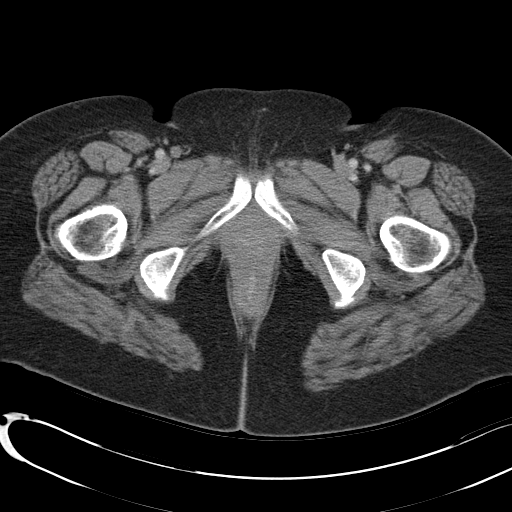
[im 7/97  bone]
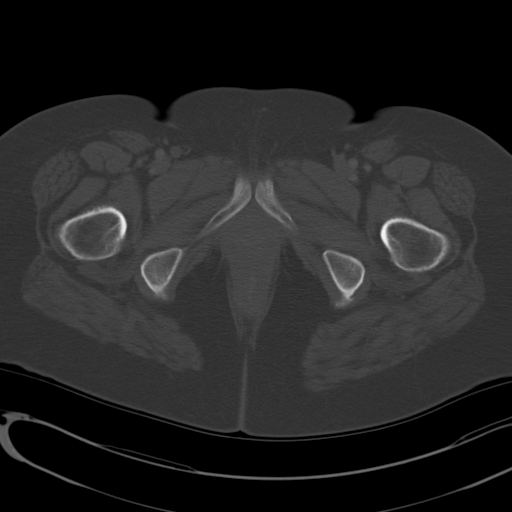
[im 19/97  soft-tissue]
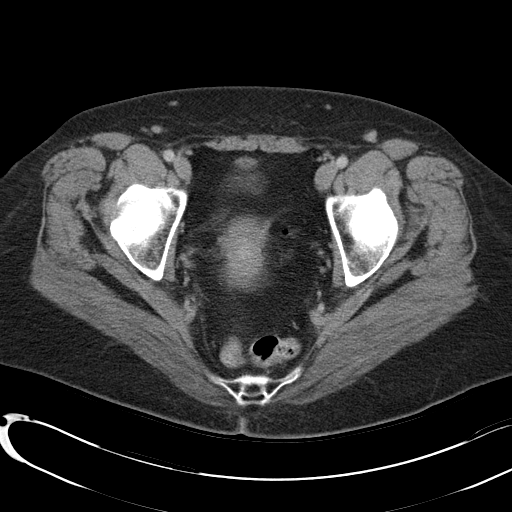
[im 25/97  soft-tissue]
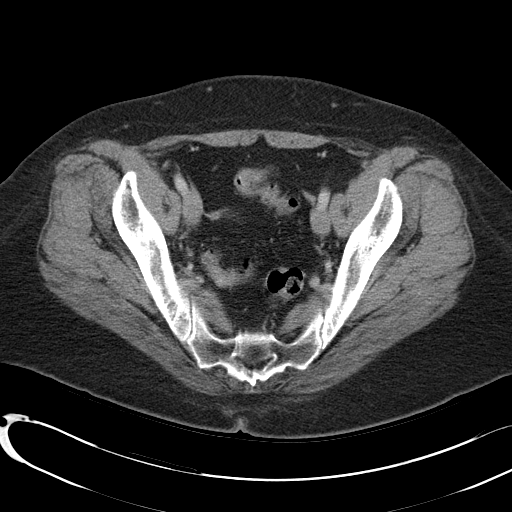
[im 37/97  soft-tissue]
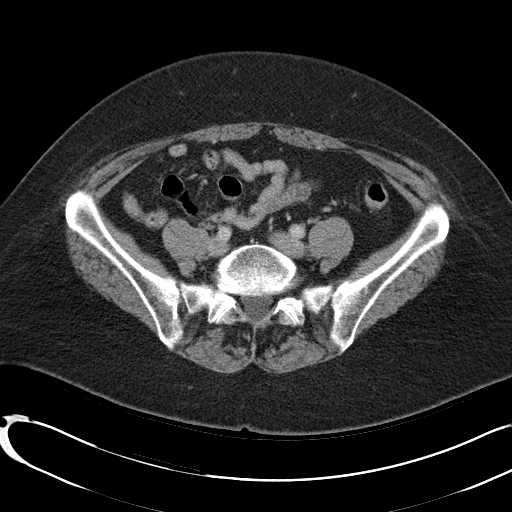
[im 43/97  soft-tissue]
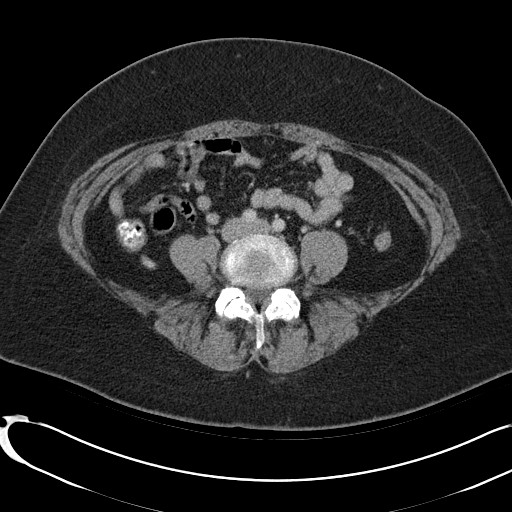
[im 55/97  soft-tissue]
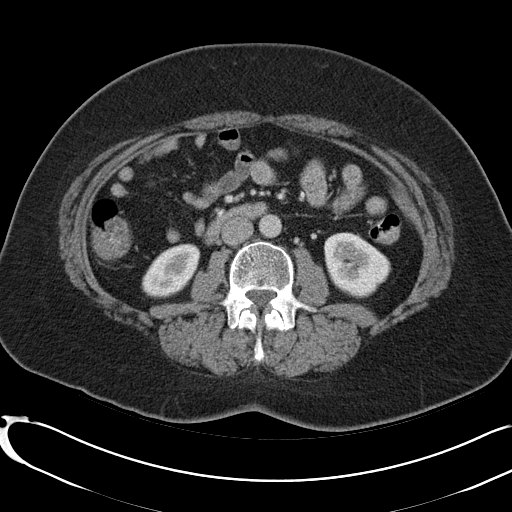
[im 61/97  soft-tissue]
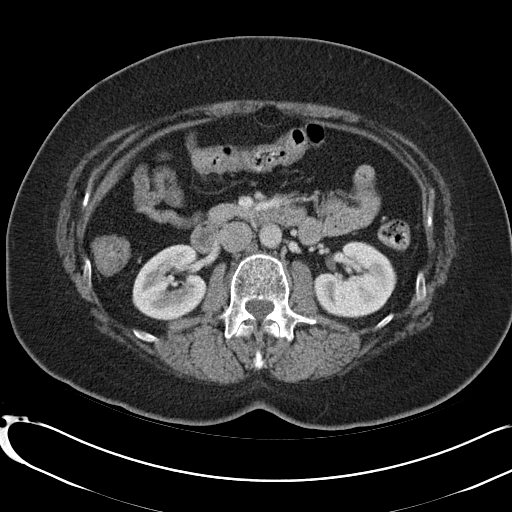
[im 73/97  soft-tissue]
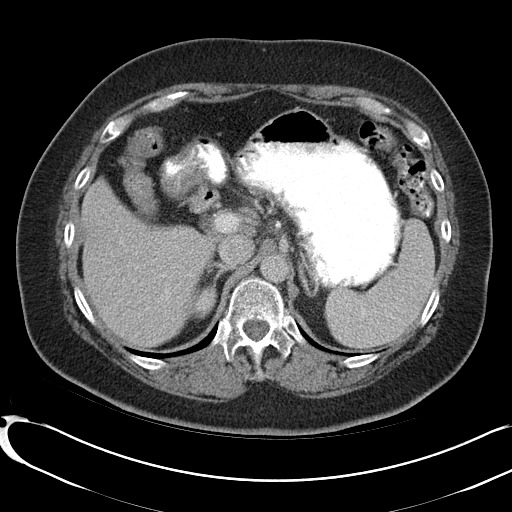
[im 73/97  lung]
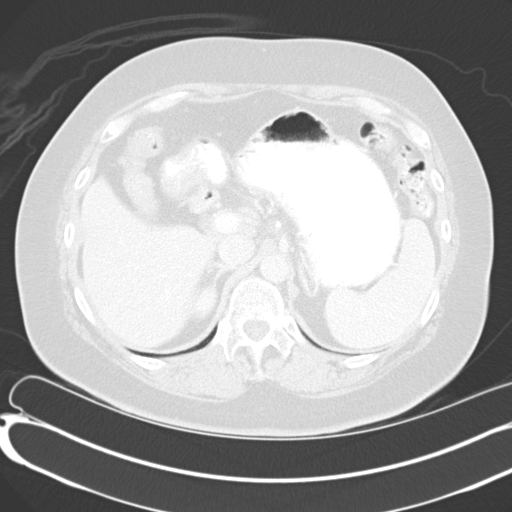
[im 79/97  soft-tissue]
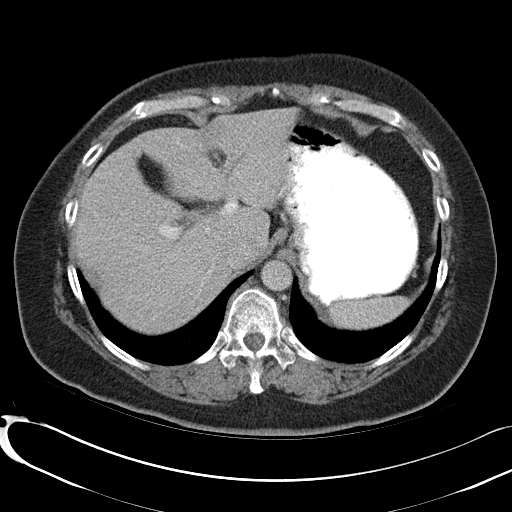
[im 79/97  lung]
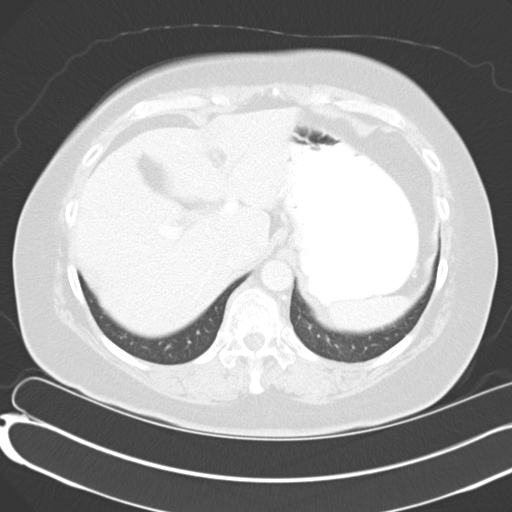
[im 79/97  bone]
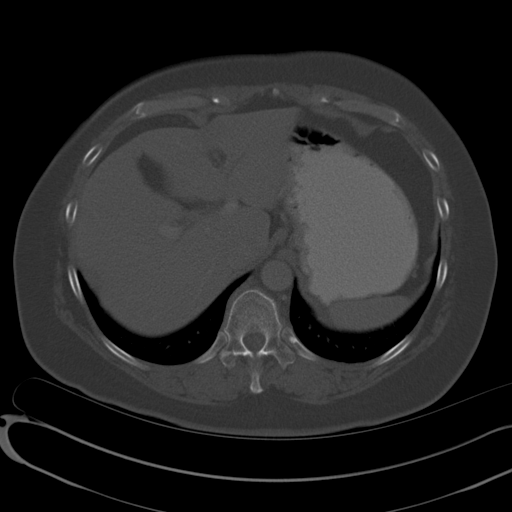
[im 85/97  lung]
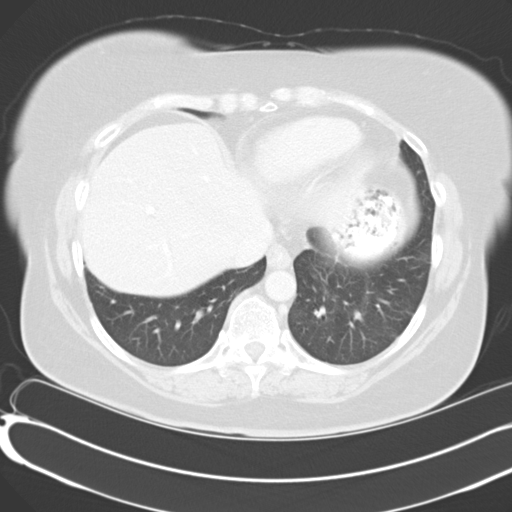
[im 91/97  soft-tissue]
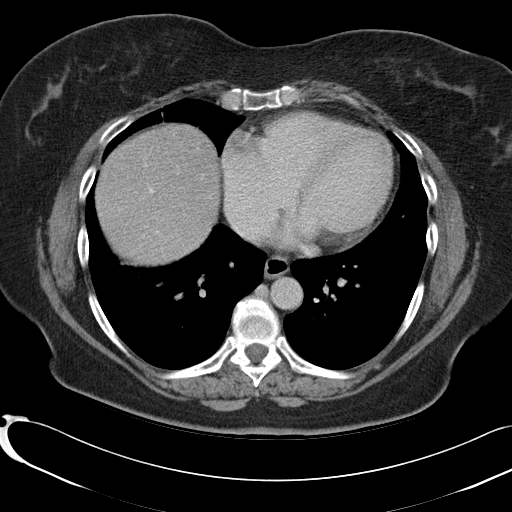
[im 91/97  lung]
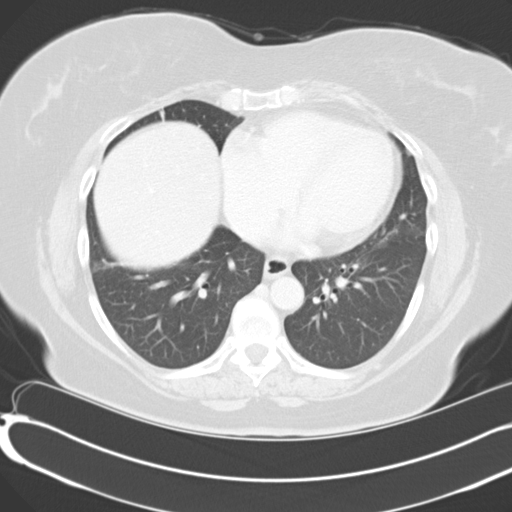

[Series 5: abd_pel_with 3.0 spo cor · coronal · 0.82mm/px · 3 of 78 slices shown]
[im 20/78  soft-tissue]
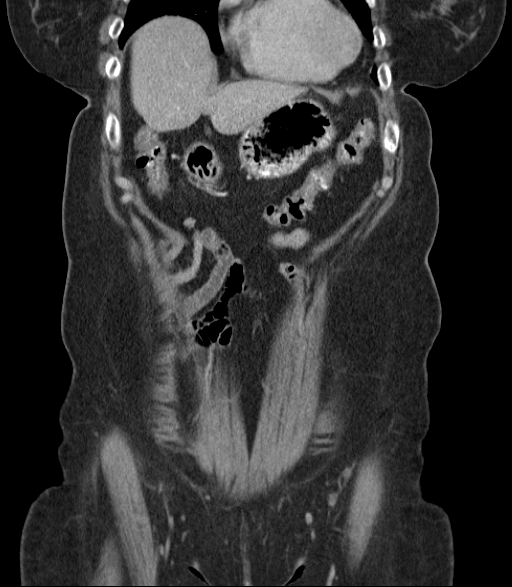
[im 39/78  soft-tissue]
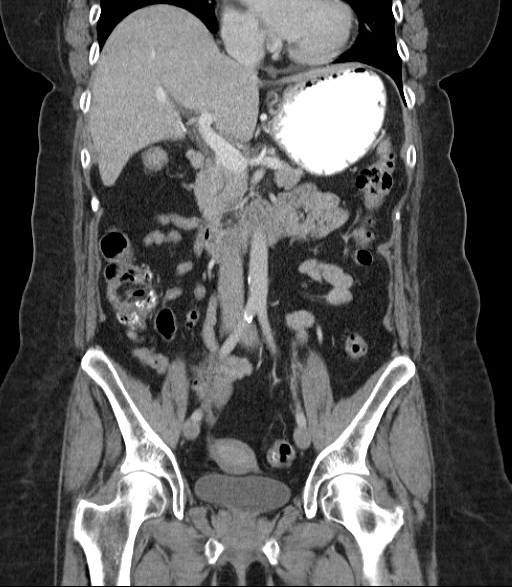
[im 58/78  soft-tissue]
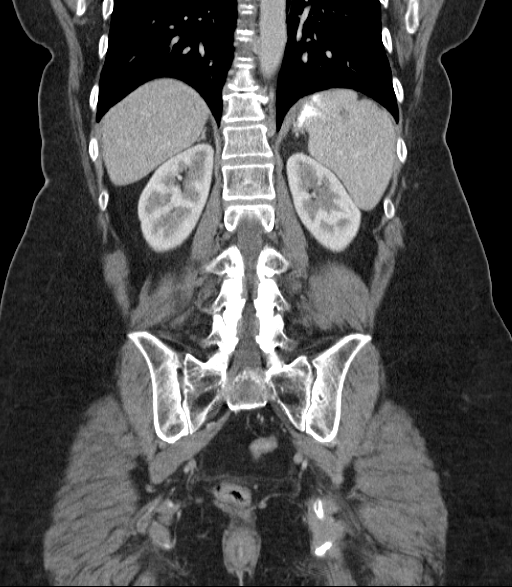

[14 of 46 positions shown; findings below may reference images not displayed]

FINDINGS: Lung bases: Essentially clear.  Heart normal size.

Hepatobiliary: Normal liver. Gallbladder surgically absent. No bile
duct dilation.

Spleen, pancreas, adrenal glands:  Normal.

Kidneys, ureters, bladder:  Normal.

Uterus and adnexa:  Normal.

Lymph nodes:  No adenopathy.

Ascites:  None.

Gastrointestinal: There are few sigmoid colon diverticula. No
diverticulitis. Colon otherwise unremarkable. Normal stomach and
small bowel. Normal appendix.

Musculoskeletal: No osteoblastic or osteolytic lesions. Degenerative
changes most evident at L5-S1.
IMPRESSION: 1. No acute findings. No findings to account for this patient's
symptoms.
2. Status post cholecystectomy.
3. There are a few sigmoid colon diverticula, but no evidence of
diverticulitis.
4. Normal appendix visualized.

## 2017-04-27 ENCOUNTER — Encounter: Payer: Self-pay | Admitting: Adult Health

## 2017-04-27 ENCOUNTER — Ambulatory Visit (INDEPENDENT_AMBULATORY_CARE_PROVIDER_SITE_OTHER): Payer: Medicare HMO | Admitting: Adult Health

## 2017-04-27 VITALS — BP 112/72 | HR 86 | Ht 65.0 in | Wt 211.0 lb

## 2017-04-27 DIAGNOSIS — R8290 Unspecified abnormal findings in urine: Secondary | ICD-10-CM

## 2017-04-27 DIAGNOSIS — G8929 Other chronic pain: Secondary | ICD-10-CM | POA: Diagnosis not present

## 2017-04-27 DIAGNOSIS — B9689 Other specified bacterial agents as the cause of diseases classified elsewhere: Secondary | ICD-10-CM | POA: Diagnosis not present

## 2017-04-27 DIAGNOSIS — R1031 Right lower quadrant pain: Secondary | ICD-10-CM

## 2017-04-27 DIAGNOSIS — R319 Hematuria, unspecified: Secondary | ICD-10-CM

## 2017-04-27 DIAGNOSIS — N898 Other specified noninflammatory disorders of vagina: Secondary | ICD-10-CM

## 2017-04-27 DIAGNOSIS — N760 Acute vaginitis: Secondary | ICD-10-CM | POA: Diagnosis not present

## 2017-04-27 LAB — POCT URINALYSIS DIPSTICK
GLUCOSE UA: NEGATIVE
KETONES UA: NEGATIVE
LEUKOCYTES UA: NEGATIVE
Nitrite, UA: NEGATIVE
PROTEIN UA: NEGATIVE

## 2017-04-27 LAB — POCT WET PREP (WET MOUNT)
Clue Cells Wet Prep Whiff POC: POSITIVE
WBC, Wet Prep HPF POC: POSITIVE

## 2017-04-27 MED ORDER — TINIDAZOLE 500 MG PO TABS: 2.0000 g | ORAL_TABLET | Freq: Every day | ORAL | 0 refills | Status: DC

## 2017-04-27 NOTE — Progress Notes (Signed)
Subjective:     Patient ID: Shelly Hanson, female   DOB: 11/25/1960, 57 y.o.   MRN: [id number]  HPI Stanton KidneyDebra is a 57 year old white female, married in complaining of odor with urine and [facility] Mac[facility] which did not agree with her and she stopped it.  Review of Systems +odor in urine  Occasional nausea Reviewed past medical,surgical, social and family history. Reviewed medications and allergies.     Objective:   Physical Exam BP 112/72 (BP Location: Left Arm, Patient Position: Sitting, Cuff Size: Large)   Pulse 86   Ht 5\' 5"  (1.651 m)   Wt 211 lb (95.7 kg)   BMI 35.11 kg/m URine dipstick trace blood. Skin warm and dry.Pelvic: external genitalia is normal in appearance no lesions, vagina: gray, watery discharge with odor,urethra has no lesions or masses noted, cervix:smooth and bulbous, uterus: normal size, shape and contour, non tender, no masses felt, adnexa: no masses, +RLQ  tenderness noted. Bladder is non tender and no masses felt. Wet prep: + for clue cells and +WBCs. Will Rx tindamax, has ?rash like 40 years ago with flagyl.    Assessment:     1. Abnormal urine odor   2. Hematuria, unspecified type   3. Chronic RLQ pain   4. Vaginal discharge   5. BV (bacterial vaginosis)       Plan:     Meds ordered this encounter  Medications  . tinidazole (TINDAMAX) 500 MG tablet    Sig: Take 4 tablets (2,000 mg total) by mouth daily with breakfast.    Dispense:  8 tablet    Refill:  0    Order Specific Question:   Supervising Provider    Answer:   Lazaro ArmsEURE, LUTHER H [[id number]]  Review handout on BV Push fluids UA C&S sent F/U prn

## 2017-04-27 NOTE — Patient Instructions (Signed)
Bacterial Vaginosis Bacterial vaginosis is a vaginal infection that occurs when the normal balance of bacteria in the vagina is disrupted. It results from an overgrowth of certain bacteria. This is the most common vaginal infection among women ages 15-44. Because bacterial vaginosis increases your risk for STIs (sexually transmitted infections), getting treated can help reduce your risk for chlamydia, gonorrhea, herpes, and HIV (human immunodeficiency virus). Treatment is also important for preventing complications in pregnant women, because this condition can cause an early (premature) delivery. What are the causes? This condition is caused by an increase in harmful bacteria that are normally present in small amounts in the vagina. However, the reason that the condition develops is not fully understood. What increases the risk? The following factors may make you more likely to develop this condition:  Having a new sexual partner or multiple sexual partners.  Having unprotected sex.  Douching.  Having an intrauterine device (IUD).  Smoking.  Drug and alcohol abuse.  Taking certain antibiotic medicines.  Being pregnant.  You cannot get bacterial vaginosis from toilet seats, bedding, swimming pools, or contact with objects around you. What are the signs or symptoms? Symptoms of this condition include:  Grey or white vaginal discharge. The discharge can also be watery or foamy.  A fish-like odor with discharge, especially after sexual intercourse or during menstruation.  Itching in and around the vagina.  Burning or pain with urination.  Some women with bacterial vaginosis have no signs or symptoms. How is this diagnosed? This condition is diagnosed based on:  Your medical history.  A physical exam of the vagina.  Testing a sample of vaginal fluid under a microscope to look for a large amount of bad bacteria or abnormal cells. Your health care provider may use a cotton swab  or a small wooden spatula to collect the sample.  How is this treated? This condition is treated with antibiotics. These may be given as a pill, a vaginal cream, or a medicine that is put into the vagina (suppository). If the condition comes back after treatment, a second round of antibiotics may be needed. Follow these instructions at home: Medicines  Take over-the-counter and prescription medicines only as told by your health care provider.  Take or use your antibiotic as told by your health care provider. Do not stop taking or using the antibiotic even if you start to feel better. General instructions  If you have a female sexual partner, tell her that you have a vaginal infection. She should see her health care provider and be treated if she has symptoms. If you have a female sexual partner, he does not need treatment.  During treatment: ? Avoid sexual activity until you finish treatment. ? Do not douche. ? Avoid alcohol as directed by your health care provider. ? Avoid breastfeeding as directed by your health care provider.  Drink enough water and fluids to keep your urine clear or pale yellow.  Keep the area around your vagina and rectum clean. ? Wash the area daily with warm water. ? Wipe yourself from front to back after using the toilet.  Keep all follow-up visits as told by your health care provider. This is important. How is this prevented?  Do not douche.  Wash the outside of your vagina with warm water only.  Use protection when having sex. This includes latex condoms and dental dams.  Limit how many sexual partners you have. To help prevent bacterial vaginosis, it is best to have sex with just   one partner (monogamous).  Make sure you and your sexual partner are tested for STIs.  Wear cotton or cotton-lined underwear.  Avoid wearing tight pants and pantyhose, especially during summer.  Limit the amount of alcohol that you drink.  Do not use any products that  contain nicotine or tobacco, such as cigarettes and e-cigarettes. If you need help quitting, ask your health care provider.  Do not use illegal drugs. Where to find more information:  Centers for Disease Control and Prevention: www.cdc.gov/std  American Sexual Health Association (ASHA): www.ashastd.org  U.S. Department of Health and Human Services, Office on Women's Health: www.womenshealth.gov/ or https://www.womenshealth.gov/a-z-topics/bacterial-vaginosis Contact a health care provider if:  Your symptoms do not improve, even after treatment.  You have more discharge or pain when urinating.  You have a fever.  You have pain in your abdomen.  You have pain during sex.  You have vaginal bleeding between periods. Summary  Bacterial vaginosis is a vaginal infection that occurs when the normal balance of bacteria in the vagina is disrupted.  Because bacterial vaginosis increases your risk for STIs (sexually transmitted infections), getting treated can help reduce your risk for chlamydia, gonorrhea, herpes, and HIV (human immunodeficiency virus). Treatment is also important for preventing complications in pregnant women, because the condition can cause an early (premature) delivery.  This condition is treated with antibiotic medicines. These may be given as a pill, a vaginal cream, or a medicine that is put into the vagina (suppository). This information is not intended to replace advice given to you by your health care provider. Make sure you discuss any questions you have with your health care provider. Document Released: 04/06/2005 Document Revised: 08/10/2016 Document Reviewed: 12/21/2015 Elsevier Interactive Patient Education  2018 [employer] Inc.  

## 2017-04-28 LAB — MICROSCOPIC EXAMINATION: CASTS: NONE SEEN /LPF

## 2017-04-28 LAB — URINALYSIS, ROUTINE W REFLEX MICROSCOPIC
BILIRUBIN UA: NEGATIVE
Glucose, UA: NEGATIVE
Ketones, UA: NEGATIVE
Nitrite, UA: POSITIVE — AB
PH UA: 6.5 (ref 5.0–7.5)
PROTEIN UA: NEGATIVE
RBC UA: NEGATIVE
Specific Gravity, UA: 1.025 (ref 1.005–1.030)
Urobilinogen, Ur: 1 mg/dL (ref 0.2–1.0)

## 2017-04-29 ENCOUNTER — Ambulatory Visit: Payer: Medicare HMO | Admitting: Pain Medicine

## 2017-04-29 LAB — URINE CULTURE

## 2017-04-30 ENCOUNTER — Telehealth: Payer: Self-pay | Admitting: Adult Health

## 2017-04-30 MED ORDER — CIPROFLOXACIN HCL 500 MG PO TABS: 500.0000 mg | ORAL_TABLET | Freq: Two times a day (BID) | ORAL | 0 refills | Status: DC

## 2017-04-30 NOTE — Telephone Encounter (Signed)
Pt aware has UTI + Ecoli, will Rx cirpo 500 mg bid x 5 days

## 2017-05-14 ENCOUNTER — Other Ambulatory Visit (INDEPENDENT_AMBULATORY_CARE_PROVIDER_SITE_OTHER): Payer: Self-pay | Admitting: Internal Medicine

## 2017-05-14 ENCOUNTER — Other Ambulatory Visit: Payer: Self-pay | Admitting: Obstetrics and Gynecology

## 2017-05-14 DIAGNOSIS — Z8719 Personal history of other diseases of the digestive system: Secondary | ICD-10-CM

## 2017-05-19 ENCOUNTER — Other Ambulatory Visit: Payer: Self-pay | Admitting: Obstetrics and Gynecology

## 2017-05-20 ENCOUNTER — Telehealth: Payer: Self-pay | Admitting: *Deleted

## 2017-05-20 MED ORDER — ONDANSETRON HCL 8 MG PO TABS: 8.0000 mg | ORAL_TABLET | Freq: Three times a day (TID) | ORAL | 1 refills | Status: DC | PRN

## 2017-05-20 MED ORDER — MEDROXYPROGESTERONE ACETATE 5 MG PO TABS: ORAL_TABLET | ORAL | 0 refills | Status: DC

## 2017-05-20 NOTE — Telephone Encounter (Signed)
Verbal ordered received for provera and zofran from Dr Emelda FearFerguson.

## 2017-06-16 ENCOUNTER — Emergency Department (HOSPITAL_COMMUNITY): Payer: Medicare HMO

## 2017-06-16 ENCOUNTER — Other Ambulatory Visit: Payer: Self-pay

## 2017-06-16 ENCOUNTER — Emergency Department (HOSPITAL_COMMUNITY)
Admission: EM | Admit: 2017-06-16 | Discharge: 2017-06-16 | Disposition: A | Discharge status: Home / Self Care | Payer: Medicare HMO | Attending: Emergency Medicine | Admitting: Emergency Medicine

## 2017-06-16 ENCOUNTER — Encounter (HOSPITAL_COMMUNITY): Payer: Self-pay | Admitting: Emergency Medicine

## 2017-06-16 DIAGNOSIS — Z79899 Other long term (current) drug therapy: Secondary | ICD-10-CM | POA: Diagnosis not present

## 2017-06-16 DIAGNOSIS — Y9389 Activity, other specified: Secondary | ICD-10-CM | POA: Insufficient documentation

## 2017-06-16 DIAGNOSIS — Y92018 Other place in single-family (private) house as the place of occurrence of the external cause: Secondary | ICD-10-CM | POA: Insufficient documentation

## 2017-06-16 DIAGNOSIS — Z23 Encounter for immunization: Secondary | ICD-10-CM | POA: Insufficient documentation

## 2017-06-16 DIAGNOSIS — S51852A Open bite of left forearm, initial encounter: Secondary | ICD-10-CM | POA: Insufficient documentation

## 2017-06-16 DIAGNOSIS — S71152A Open bite, left thigh, initial encounter: Secondary | ICD-10-CM | POA: Diagnosis present

## 2017-06-16 DIAGNOSIS — S31815A Open bite of right buttock, initial encounter: Secondary | ICD-10-CM | POA: Insufficient documentation

## 2017-06-16 DIAGNOSIS — Y999 Unspecified external cause status: Secondary | ICD-10-CM | POA: Insufficient documentation

## 2017-06-16 DIAGNOSIS — S61452A Open bite of left hand, initial encounter: Secondary | ICD-10-CM

## 2017-06-16 DIAGNOSIS — F17210 Nicotine dependence, cigarettes, uncomplicated: Secondary | ICD-10-CM | POA: Insufficient documentation

## 2017-06-16 DIAGNOSIS — S71151A Open bite, right thigh, initial encounter: Secondary | ICD-10-CM

## 2017-06-16 DIAGNOSIS — S51851A Open bite of right forearm, initial encounter: Secondary | ICD-10-CM | POA: Insufficient documentation

## 2017-06-16 DIAGNOSIS — W540XXA Bitten by dog, initial encounter: Secondary | ICD-10-CM | POA: Insufficient documentation

## 2017-06-16 LAB — BASIC METABOLIC PANEL
ANION GAP: 10 (ref 5–15)
BUN: 20 mg/dL (ref 6–20)
CO2: 26 mmol/L (ref 22–32)
Calcium: 9 mg/dL (ref 8.9–10.3)
Chloride: 105 mmol/L (ref 101–111)
Creatinine, Ser: 0.88 mg/dL (ref 0.44–1.00)
GFR calc Af Amer: >60 mL/min (ref >60)
GFR calc non Af Amer: >60 mL/min (ref >60)
GLUCOSE: 107 mg/dL — AB (ref 65–99)
Potassium: 3.8 mmol/L (ref 3.5–5.1)
Sodium: 141 mmol/L (ref 135–145)

## 2017-06-16 LAB — CBC WITH DIFFERENTIAL/PLATELET
BASOS ABS: 0.1 10*3/uL (ref 0.0–0.1)
Basophils Relative: 1 %
EOS PCT: 2 %
Eosinophils Absolute: 0.2 10*3/uL (ref 0.0–0.7)
HEMATOCRIT: 45 % (ref 36.0–46.0)
Hemoglobin: 14.8 g/dL (ref 12.0–15.0)
LYMPHS ABS: 4.5 10*3/uL — AB (ref 0.7–4.0)
LYMPHS PCT: 44 %
MCH: 30.8 pg (ref 26.0–34.0)
MCHC: 32.9 g/dL (ref 30.0–36.0)
MCV: 93.8 fL (ref 78.0–100.0)
MONO ABS: 0.8 10*3/uL (ref 0.1–1.0)
MONOS PCT: 8 %
NEUTROS ABS: 4.7 10*3/uL (ref 1.7–7.7)
Neutrophils Relative %: 45 %
Platelets: 214 10*3/uL (ref 150–400)
RBC: 4.8 MIL/uL (ref 3.87–5.11)
RDW: 13.5 % (ref 11.5–15.5)
WBC: 10.2 10*3/uL (ref 4.0–10.5)

## 2017-06-16 MED ORDER — SODIUM CHLORIDE 0.9 % IV BOLUS (SEPSIS)
500.0000 mL | Freq: Once | INTRAVENOUS | Status: AC
  Administered 2017-06-16: 500 mL via INTRAVENOUS

## 2017-06-16 MED ORDER — SODIUM CHLORIDE 0.9 % IV SOLN
INTRAVENOUS | Status: DC
  Administered 2017-06-16: 19:00:00 via INTRAVENOUS

## 2017-06-16 MED ORDER — AMOXICILLIN-POT CLAVULANATE 875-125 MG PO TABS: 1.0000 | ORAL_TABLET | Freq: Two times a day (BID) | ORAL | 0 refills | Status: AC

## 2017-06-16 MED ORDER — TETANUS-DIPHTH-ACELL PERTUSSIS 5-2.5-18.5 LF-MCG/0.5 IM SUSP
0.5000 mL | Freq: Once | INTRAMUSCULAR | Status: AC
  Administered 2017-06-16: 0.5 mL via INTRAMUSCULAR
  Filled 2017-06-16: qty 0.5

## 2017-06-16 NOTE — Discharge Instructions (Signed)
Wash wounds daily with soap and water.  Apply an antibiotic ointment probably will need to be bacitracin for you since she had the sulfur allergy.  Also take the Augmentin antibiotic for the next 7 days.  If any of the animals become sick you may require rabies vaccination for yourself.  Return for any new or worse symptoms.  Your tetanus was updated here today.

## 2017-06-16 NOTE — ED Notes (Signed)
Husband with wife when questioned about dogs shots and what vet became very argumentative stating he has his own person that gives shot. Then I asked the husband the address and he said he wasn't going to give me the address all I needed to do was take care of his wife. I stated I was and that I still had to call animal control. [place] animal control called and notifed of dog attack

## 2017-06-16 NOTE — ED Triage Notes (Signed)
Pt states was bit by her pit bull dog unprovoked. Pt  States dog bit her on her right arm and leg. Tetanus unknown

## 2017-06-16 NOTE — ED Notes (Signed)
Animal control returned call and information given. They will go to address tomorrow and pick up dog
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
## 2017-06-16 NOTE — ED Notes (Signed)
[facility] called per request of Dr Saul FordyceZacowski. EDP noted old bite marks to pts right calf of leg. Spoke with Lillia DallasJacklyn Henderson for intake report

## 2017-07-12 DIAGNOSIS — M47817 Spondylosis without myelopathy or radiculopathy, lumbosacral region: Secondary | ICD-10-CM | POA: Insufficient documentation

## 2017-07-12 NOTE — Progress Notes (Signed)
Patient's Name: Shelly Hanson  MRN: [id number]  Referring Provider: Delano MetzNaveira, Blessing Zaucha, MD  DOB: 09/23/1960  PCP: Phyllis GingerBaucom, Jenny B, PA-C  DOS: 07/13/2017  Note by: Oswaldo DoneFrancisco A Milik Gilreath, MD  Service setting: Ambulatory outpatient  Specialty: Interventional Pain Management  Patient type: Established  Location: [facility] (AMB) Pain Management Facility  Visit type: Interventional Procedure   Primary Reason for Visit: Interventional Pain Management Treatment. CC: Back Pain (right, lower)  Procedure:       Anesthesia, Analgesia, Anxiolysis:  Type: Lumbar Facet, Medial Branch Block(s) #2  Primary Purpose: Diagnostic Region: Posterolateral Lumbosacral Spine Level: L2, L3, L4, L5, & S1 Medial Branch Level(s). Injecting these levels blocks the L3-4, L4-5, and L5-S1 lumbar facet joints. Laterality: Bilateral  Type: Moderate (Conscious) Sedation combined with Local Anesthesia Indication(s): Analgesia and Anxiety Route: Intravenous (IV) IV Access: Secured Sedation: Meaningful verbal contact was maintained at all times during the procedure  Local Anesthetic: Lidocaine 1-2%   Indications: 1. Spondylosis without myelopathy or radiculopathy, lumbosacral region   2. Lumbar facet syndrome (Bilateral) (R>L)   3. Chronic low back pain (Location of Primary Source of Pain) (Midline) (Bilateral) (R>L)   4. Lumbar facet hypertrophy (multilevel)    Pain Score: Pre-procedure: 5 /10 Post-procedure: 0-No pain/10  Pre-op Assessment:  Shelly Hanson is a 57 y.o. (year old), female patient, seen today for interventional treatment. She  has a past surgical history that includes Foot surgery; Cholecystectomy; Dilation and curettage of uterus; Colonoscopy (N/A, 11/17/2013); Esophagogastroduodenoscopy (N/A, 11/17/2013); Balloon dilation (N/A, 11/17/2013); maloney dilation (N/A, 11/17/2013); Savory dilation (N/A, 11/17/2013); and Carpal tunnel release. Shelly Hanson has a current medication list which includes the following  prescription(s): dicyclomine, docusate sodium, estradiol, fluticasone, gabapentin, icosapent ethyl, magnesium, medroxyprogesterone, meloxicam, omeprazole, ondansetron, ranitidine, rosuvastatin, tramadol, and vitamin d (ergocalciferol), and the following Facility-Administered Medications: fentanyl and midazolam. Her primarily concern today is the Back Pain (right, lower)  Initial Vital Signs:  Pulse Rate: 80 Temp: 97.9 F (36.6 C) Resp: 18 BP: 127/85 SpO2: 99 %  BMI: Estimated body mass index is 34.95 kg/m as calculated from the following:   Height as of this encounter: 5\' 5"  (1.651 m).   Weight as of this encounter: 210 lb (95.3 kg).  Risk Assessment: Allergies: Reviewed. She is allergic to codeine; flagyl [metronidazole]; naproxen; nitrofurantoin; and sulfa antibiotics.  Allergy Precautions: None required Coagulopathies: Reviewed. None identified.  Blood-thinner therapy: None at this time Active Infection(s): Reviewed. None identified. Shelly Hanson is afebrile  Site Confirmation: Shelly Hanson was asked to confirm the procedure and laterality before marking the site Procedure checklist: Completed Consent: Before the procedure and under the influence of no sedative(s), amnesic(s), or anxiolytics, the patient was informed of the treatment options, risks and possible complications. To fulfill our ethical and legal obligations, as recommended by the American Medical Association's Code of Ethics, I have informed the patient of my clinical impression; the nature and purpose of the treatment or procedure; the risks, benefits, and possible complications of the intervention; the alternatives, including doing nothing; the risk(s) and benefit(s) of the alternative treatment(s) or procedure(s); and the risk(s) and benefit(s) of doing nothing. The patient was provided information about the general risks and possible complications associated with the procedure. These may include, but are not limited to:  failure to achieve desired goals, infection, bleeding, organ or nerve damage, allergic reactions, paralysis, and death. In addition, the patient was informed of those risks and complications associated to Spine-related procedures, such as failure to decrease pain; infection (i.e.: Meningitis, epidural  or intraspinal abscess); bleeding (i.e.: epidural hematoma, subarachnoid hemorrhage, or any other type of intraspinal or peri-dural bleeding); organ or nerve damage (i.e.: Any type of peripheral nerve, nerve root, or spinal cord injury) with subsequent damage to sensory, motor, and/or autonomic systems, resulting in permanent pain, numbness, and/or weakness of one or several areas of the body; allergic reactions; (i.e.: anaphylactic reaction); and/or death. Furthermore, the patient was informed of those risks and complications associated with the medications. These include, but are not limited to: allergic reactions (i.e.: anaphylactic or anaphylactoid reaction(s)); adrenal axis suppression; blood sugar elevation that in diabetics may result in ketoacidosis or comma; water retention that in patients with history of congestive heart failure may result in shortness of breath, pulmonary edema, and decompensation with resultant heart failure; weight gain; swelling or edema; medication-induced neural toxicity; particulate matter embolism and blood vessel occlusion with resultant organ, and/or nervous system infarction; and/or aseptic necrosis of one or more joints. Finally, the patient was informed that Medicine is not an exact science; therefore, there is also the possibility of unforeseen or unpredictable risks and/or possible complications that may result in a catastrophic outcome. The patient indicated having understood very clearly. We have given the patient no guarantees and we have made no promises. Enough time was given to the patient to ask questions, all of which were answered to the patient's satisfaction. [name] has indicated that she wanted to continue with the procedure. Attestation: I, the ordering provider, attest that I have discussed with the patient the benefits, risks, side-effects, alternatives, likelihood of achieving goals, and potential problems during recovery for the procedure that I have provided informed consent. Date  Time: 07/13/2017  9:59 AM  Pre-Procedure Preparation:  Monitoring: As per clinic protocol. Respiration, ETCO2, SpO2, BP, heart rate and rhythm monitor placed and checked for adequate function Safety Precautions: Patient was assessed for positional comfort and pressure points before starting the procedure. Time-out: I initiated and conducted the "Time-out" before starting the procedure, as per protocol. The patient was asked to participate by confirming the accuracy of the "Time Out" information. Verification of the correct person, site, and procedure were performed and confirmed by me, the nursing staff, and the patient. "Time-out" conducted as per Joint Commission's Universal Protocol (UP.01.01.01). Time: 1041  Description of Procedure:       Position: Prone Laterality: Bilateral. The procedure was performed in identical fashion on both sides. Levels:  L2, L3, L4, L5, & S1 Medial Branch Level(s) Area Prepped: Posterior Lumbosacral Region Prepping solution: ChloraPrep (2% chlorhexidine gluconate and 70% isopropyl alcohol) Safety Precautions: Aspiration looking for blood return was conducted prior to all injections. At no point did we inject any substances, as a needle was being advanced. Before injecting, the patient was told to immediately notify me if she was experiencing any new onset of "ringing in the ears, or metallic taste in the mouth". No attempts were made at seeking any paresthesias. Safe injection practices and needle disposal techniques used. Medications properly checked for expiration dates. SDV (single dose vial) medications used. After the completion of  the procedure, all disposable equipment used was discarded in the proper designated medical waste containers. Local Anesthesia: Protocol guidelines were followed. The patient was positioned over the fluoroscopy table. The area was prepped in the usual manner. The time-out was completed. The target area was identified using fluoroscopy. A 12-in long, straight, sterile hemostat was used with fluoroscopic guidance to locate the targets for each level blocked. Once located, the skin was  marked with an approved surgical skin marker. Once all sites were marked, the skin (epidermis, dermis, and hypodermis), as well as deeper tissues (fat, connective tissue and muscle) were infiltrated with a small amount of a short-acting local anesthetic, loaded on a 10cc syringe with a 25G, 1.5-in  Needle. An appropriate amount of time was allowed for local anesthetics to take effect before proceeding to the next step. Local Anesthetic: Lidocaine 2.0% The unused portion of the local anesthetic was discarded in the proper designated containers. Technical explanation of process:  L2 Medial Branch Nerve Block (MBB): The target area for the L2 medial branch is at the junction of the postero-lateral aspect of the superior articular process and the superior, posterior, and medial edge of the transverse process of L3. Under fluoroscopic guidance, a Quincke needle was inserted until contact was made with os over the superior postero-lateral aspect of the pedicular shadow (target area). After negative aspiration for blood, 0.5 mL of the nerve block solution was injected without difficulty or complication. The needle was removed intact. L3 Medial Branch Nerve Block (MBB): The target area for the L3 medial branch is at the junction of the postero-lateral aspect of the superior articular process and the superior, posterior, and medial edge of the transverse process of L4. Under fluoroscopic guidance, a Quincke needle was inserted until contact  was made with os over the superior postero-lateral aspect of the pedicular shadow (target area). After negative aspiration for blood, 0.5 mL of the nerve block solution was injected without difficulty or complication. The needle was removed intact. L4 Medial Branch Nerve Block (MBB): The target area for the L4 medial branch is at the junction of the postero-lateral aspect of the superior articular process and the superior, posterior, and medial edge of the transverse process of L5. Under fluoroscopic guidance, a Quincke needle was inserted until contact was made with os over the superior postero-lateral aspect of the pedicular shadow (target area). After negative aspiration for blood, 0.5 mL of the nerve block solution was injected without difficulty or complication. The needle was removed intact. L5 Medial Branch Nerve Block (MBB): The target area for the L5 medial branch is at the junction of the postero-lateral aspect of the superior articular process and the superior, posterior, and medial edge of the sacral ala. Under fluoroscopic guidance, a Quincke needle was inserted until contact was made with os over the superior postero-lateral aspect of the pedicular shadow (target area). After negative aspiration for blood, 0.5 mL of the nerve block solution was injected without difficulty or complication. The needle was removed intact. S1 Medial Branch Nerve Block (MBB): The target area for the S1 medial branch is at the posterior and inferior 6 o'clock position of the L5-S1 facet joint. Under fluoroscopic guidance, the Quincke needle inserted for the L5 MBB was redirected until contact was made with os over the inferior and postero aspect of the sacrum, at the 6 o' clock position under the L5-S1 facet joint (Target area). After negative aspiration for blood, 0.5 mL of the nerve block solution was injected without difficulty or complication. The needle was removed intact. Procedural Needles: 22-gauge, 3.5-inch,  Quincke needles used for all levels. Nerve block solution: 0.2% PF-Ropivacaine + Triamcinolone (40 mg/mL) diluted to a final concentration of 4 mg of Triamcinolone/mL of Ropivacaine The unused portion of the solution was discarded in the proper designated containers.  Once the entire procedure was completed, the treated area was cleaned, making sure to leave some of the prepping  solution back to take advantage of its long term bactericidal properties.   Illustration of the posterior view of the lumbar spine and the posterior neural structures. Laminae of L2 through S1 are labeled. DPRL5, dorsal primary ramus of L5; DPRS1, dorsal primary ramus of S1; DPR3, dorsal primary ramus of L3; FJ, facet (zygapophyseal) joint L3-L4; I, inferior articular process of L4; LB1, lateral branch of dorsal primary ramus of L1; IAB, inferior articular branches from L3 medial branch (supplies L4-L5 facet joint); IBP, intermediate branch plexus; MB3, medial branch of dorsal primary ramus of L3; NR3, third lumbar nerve root; S, superior articular process of L5; SAB, superior articular branches from L4 (supplies L4-5 facet joint also); TP3, transverse process of L3.  Vitals:   07/13/17 1050 07/13/17 1100 07/13/17 1110 07/13/17 1120  BP: 108/64 106/69 116/74 119/78  Pulse:      Resp: 12 13 16 16   Temp:      TempSrc:      SpO2: 93% 95% 97% 98%  Weight:      Height:        Start Time: 1041 hrs. End Time: 1050 hrs.  Imaging Guidance (Spinal):  Type of Imaging Technique: Fluoroscopy Guidance (Spinal) Indication(s): Assistance in needle guidance and placement for procedures requiring needle placement in or near specific anatomical locations not easily accessible without such assistance. Exposure Time: Please see nurses notes. Contrast: None used. Fluoroscopic Guidance: I was personally present during the use of fluoroscopy. "Tunnel Vision Technique" used to obtain the best possible view of the target area. Parallax  error corrected before commencing the procedure. "Direction-depth-direction" technique used to introduce the needle under continuous pulsed fluoroscopy. Once target was reached, antero-posterior, oblique, and lateral fluoroscopic projection used confirm needle placement in all planes. Images permanently stored in EMR. Interpretation: No contrast injected. I personally interpreted the imaging intraoperatively. Adequate needle placement confirmed in multiple planes. Permanent images saved into the patient's record.  Antibiotic Prophylaxis:   Anti-infectives (From admission, onward)   None     Indication(s): None identified  Post-operative Assessment:  Post-procedure Vital Signs:  Pulse Rate: 80 Temp: 97.9 F (36.6 C) Resp: 16 BP: 119/78 SpO2: 98 %  EBL: None  Complications: No immediate post-treatment complications observed by team, or reported by patient.  Note: The patient tolerated the entire procedure well. A repeat set of vitals were taken after the procedure and the patient was kept under observation following institutional policy, for this type of procedure. Post-procedural neurological assessment was performed, showing return to baseline, prior to discharge. The patient was provided with post-procedure discharge instructions, including a section on how to identify potential problems. Should any problems arise concerning this procedure, the patient was given instructions to immediately contact us, at any time, without hesitation. In any case, we plan to contact the patient by telephone for a follow-up status report regarding this interventional procedure.  Comments:  No additional relevant information.  Plan of Care    Imaging Orders     DG C-Arm 1-60 Min-No Report  Procedure Orders     LUMBAR FACET(MEDIAL BRANCH NERVE BLOCK) MBNB  Medications ordered for procedure: Meds ordered this encounter  Medications  . lidocaine (XYLOCAINE) 2 % (with pres) injection 400 mg  .  midazolam (VERSED) 5 MG/5ML injection 1-2 mg    Make sure Flumazenil is available in the pyxis when using this medication. If oversedation occurs, administer 0.2 mg IV over 15 sec. If after 45 sec no response, administer 0.2 mg again over 1 min;  may repeat at 1 min intervals; not to exceed 4 doses (1 mg)  . fentaNYL (SUBLIMAZE) injection 25-50 mcg    Make sure Narcan is available in the pyxis when using this medication. In the event of respiratory depression (RR< 8/min): Titrate NARCAN (naloxone) in increments of 0.1 to 0.2 mg IV at 2-3 minute intervals, until desired degree of reversal.  . lactated ringers infusion 1,000 mL  . ropivacaine (PF) 2 mg/mL (0.2%) (NAROPIN) injection 18 mL  . triamcinolone acetonide (KENALOG-40) injection 80 mg   Medications administered: We administered lidocaine, midazolam, fentaNYL, lactated ringers, ropivacaine (PF) 2 mg/mL (0.2%), and triamcinolone acetonide.  See the medical record for exact dosing, route, and time of administration.  New Prescriptions   No medications on file   Disposition: Discharge home  Discharge Date & Time: 07/13/2017; 1124 hrs.   Physician-requested Follow-up: Return for post-procedure eval (2 wks), w/ Dr. Laban Emperor.  Future Appointments  Date Time Provider Department Center  08/02/2017  1:15 PM Delano Metz, MD [facility] None  02/08/2018 11:30 AM Valetta Fuller, Brand Males, NP [facility] None   Primary Care Physician: Phyllis Ginger Location: [facility] Note by: Oswaldo Done, MD Date: 07/13/2017; Time: 12:34 PM  Disclaimer:  Medicine is not an Visual merchandiser. The only guarantee in medicine is that nothing is guaranteed. It is important to note that the decision to proceed with this intervention was based on the information collected from the patient. The Data and conclusions were drawn from the patient's questionnaire, the interview, and the physical examination. Because the information was  provided in large part by the patient, it cannot be guaranteed that it has not been purposely or unconsciously manipulated. Every effort has been made to obtain as much relevant data as possible for this evaluation. It is important to note that the conclusions that lead to this procedure are derived in large part from the available data. Always take into account that the treatment will also be dependent on availability of resources and existing treatment guidelines, considered by other Pain Management Practitioners as being common knowledge and practice, at the time of the intervention. For Medico-Legal purposes, it is also important to point out that variation in procedural techniques and pharmacological choices are the acceptable norm. The indications, contraindications, technique, and results of the above procedure should only be interpreted and judged by a Board-Certified Interventional Pain Specialist with extensive familiarity and expertise in the same exact procedure and technique.

## 2017-07-13 ENCOUNTER — Ambulatory Visit (HOSPITAL_BASED_OUTPATIENT_CLINIC_OR_DEPARTMENT_OTHER): Payer: Medicare HMO | Admitting: Pain Medicine

## 2017-07-13 ENCOUNTER — Other Ambulatory Visit: Payer: Self-pay

## 2017-07-13 ENCOUNTER — Ambulatory Visit
Admission: RE | Admit: 2017-07-13 | Discharge: 2017-07-13 | Disposition: A | Discharge status: Home / Self Care | Payer: Medicare HMO | Source: Ambulatory Visit | Attending: Pain Medicine | Admitting: Pain Medicine

## 2017-07-13 ENCOUNTER — Encounter: Payer: Self-pay | Admitting: Pain Medicine

## 2017-07-13 VITALS — BP 119/78 | HR 80 | Temp 97.9°F | Resp 16 | Ht 65.0 in | Wt 210.0 lb

## 2017-07-13 DIAGNOSIS — M5441 Lumbago with sciatica, right side: Secondary | ICD-10-CM

## 2017-07-13 DIAGNOSIS — M47817 Spondylosis without myelopathy or radiculopathy, lumbosacral region: Secondary | ICD-10-CM | POA: Insufficient documentation

## 2017-07-13 DIAGNOSIS — M5442 Lumbago with sciatica, left side: Secondary | ICD-10-CM

## 2017-07-13 DIAGNOSIS — G8929 Other chronic pain: Secondary | ICD-10-CM | POA: Insufficient documentation

## 2017-07-13 DIAGNOSIS — M545 Low back pain: Secondary | ICD-10-CM | POA: Insufficient documentation

## 2017-07-13 DIAGNOSIS — M47816 Spondylosis without myelopathy or radiculopathy, lumbar region: Secondary | ICD-10-CM

## 2017-07-13 MED ORDER — MIDAZOLAM HCL 5 MG/5ML IJ SOLN
INTRAMUSCULAR | Status: AC
  Filled 2017-07-13: qty 5

## 2017-07-13 MED ORDER — TRIAMCINOLONE ACETONIDE 40 MG/ML IJ SUSP
80.0000 mg | Freq: Once | INTRAMUSCULAR | Status: AC
  Administered 2017-07-13: 40 mg

## 2017-07-13 MED ORDER — ROPIVACAINE HCL 2 MG/ML IJ SOLN
18.0000 mL | Freq: Once | INTRAMUSCULAR | Status: AC
  Administered 2017-07-13: 9 mL via PERINEURAL

## 2017-07-13 MED ORDER — TRIAMCINOLONE ACETONIDE 40 MG/ML IJ SUSP
INTRAMUSCULAR | Status: AC
  Filled 2017-07-13: qty 2

## 2017-07-13 MED ORDER — ROPIVACAINE HCL 2 MG/ML IJ SOLN
INTRAMUSCULAR | Status: AC
  Filled 2017-07-13: qty 10

## 2017-07-13 MED ORDER — LIDOCAINE HCL 2 % IJ SOLN
20.0000 mL | Freq: Once | INTRAMUSCULAR | Status: AC
  Administered 2017-07-13: 400 mg

## 2017-07-13 MED ORDER — FENTANYL CITRATE (PF) 100 MCG/2ML IJ SOLN
25.0000 ug | INTRAMUSCULAR | Status: DC | PRN
  Administered 2017-07-13: 100 ug via INTRAVENOUS

## 2017-07-13 MED ORDER — LACTATED RINGERS IV SOLN
1000.0000 mL | Freq: Once | INTRAVENOUS | Status: AC
  Administered 2017-07-13: 1000 mL via INTRAVENOUS

## 2017-07-13 MED ORDER — MIDAZOLAM HCL 5 MG/5ML IJ SOLN
1.0000 mg | INTRAMUSCULAR | Status: DC | PRN
  Administered 2017-07-13: 3 mg via INTRAVENOUS

## 2017-07-13 MED ORDER — LIDOCAINE HCL 2 % IJ SOLN
INTRAMUSCULAR | Status: AC
  Filled 2017-07-13: qty 20

## 2017-07-13 MED ORDER — FENTANYL CITRATE (PF) 100 MCG/2ML IJ SOLN
INTRAMUSCULAR | Status: AC
  Filled 2017-07-13: qty 2

## 2017-07-13 NOTE — Patient Instructions (Signed)

## 2017-07-13 NOTE — Progress Notes (Signed)
Safety precautions to be maintained throughout the outpatient stay will include: orient to surroundings, keep bed in low position, maintain call bell within reach at all times, provide assistance with transfer out of bed and ambulation.  

## 2017-07-14 ENCOUNTER — Telehealth: Payer: Self-pay

## 2017-07-14 NOTE — Telephone Encounter (Signed)
Post procedure phone call.  Left message.  

## 2017-07-16 ENCOUNTER — Other Ambulatory Visit: Payer: Self-pay | Admitting: Pain Medicine

## 2017-07-16 DIAGNOSIS — M150 Primary generalized (osteo)arthritis: Secondary | ICD-10-CM

## 2017-07-16 DIAGNOSIS — G8929 Other chronic pain: Secondary | ICD-10-CM

## 2017-07-16 DIAGNOSIS — M5441 Lumbago with sciatica, right side: Principal | ICD-10-CM

## 2017-07-16 DIAGNOSIS — M5442 Lumbago with sciatica, left side: Principal | ICD-10-CM

## 2017-07-16 DIAGNOSIS — M159 Polyosteoarthritis, unspecified: Secondary | ICD-10-CM

## 2017-08-01 NOTE — Progress Notes (Signed)
Patient's Name: Shelly Hanson  MRN: [id number]  Referring Provider: Vesta Mixer  DOB: Aug 18, 1960  PCP: Vesta Mixer  DOS: 08/02/2017  Note by: Gaspar Cola, MD  Service setting: Ambulatory outpatient  Specialty: Interventional Pain Management  Location: [facility] (AMB) Pain Management Facility    Patient type: Established   Primary Reason(s) for Visit: Encounter for post-procedure evaluation of chronic illness with mild to moderate exacerbation CC: Back Pain (lower bilateral) and Back Pain (mid thoracic, muscle spasms. )  HPI  Shelly Hanson is a 57 y.o. year old, female patient, who comes today for a post-procedure evaluation. She has Dysphagia, unspecified(787.20); High cholesterol; Perimenopausal vasomotor symptoms; Long term current use of opiate analgesic; Long term prescription opiate use; Opiate use; Encounter for therapeutic drug level monitoring; Encounter for pain management planning; Chronic low back pain (Location of Primary Source of Pain) (Midline) (Bilateral) (R>L); Chronic lower extremity pain (Location of Secondary source of pain) (Right); Chronic neck pain (Location of Tertiary source of pain) (Right); Chronic upper extremity pain (Bilateral) (R>L); Diverticulosis of colon; Anterolisthesis (L3 over L4 and L4 over L5); Lumbar facet hypertrophy (multilevel); Lumbar facet syndrome (Bilateral) (R>L); Lumbar foraminal stenosis (L4) (Left); Chronic tension-type headache, not intractable; Chronic pain syndrome; Chronic lumbar radicular pain (Location of Secondary source of pain) (Right) (L5); Marijuana use; Neurogenic pain; Osteoarthritis, multiple joints; Musculoskeletal pain; Chronic headaches; Vasomotor symptoms due to menopause; BV (bacterial vaginosis); Vaginal discharge; Chronic RLQ pain; Hematuria; Abnormal urine odor; and Spondylosis without myelopathy or radiculopathy, lumbosacral region on their problem list. Her primarily concern today is the Back Pain (lower  bilateral) and Back Pain (mid thoracic, muscle spasms. )  Pain Assessment: Location: Lower, Left, Right(also mid thoracic, muscle spasms) Back Radiating: na.  patient reports that if she lifts the right leg to wash in shower she can feel pain in the back.  Onset: More than a month ago Duration: Chronic pain Quality: Discomfort Severity: 0-No pain/10 (self-reported pain score)  Note: Reported level is compatible with observation.                         When using our objective Pain Scale, levels between 6 and 10/10 are said to belong in an emergency room, as it progressively worsens from a 6/10, described as severely limiting, requiring emergency care not usually available at an outpatient pain management facility. At a 6/10 level, communication becomes difficult and requires great effort. Assistance to reach the emergency department may be required. Facial flushing and profuse sweating along with potentially dangerous increases in heart rate and blood pressure will be evident. Effect on ADL: certain positions cause the pain.  has to take activity slow.  Timing: Intermittent Modifying factors: procedure  Shelly Hanson comes in today for post-procedure evaluation after the treatment done on 07/16/2017.  Further details on both, my assessment(s), as well as the proposed treatment plan, please see below.  Post-Procedure Assessment  07/16/2017 Procedure: Diagnostic bilateral lumbar facet block #2 under fluoroscopic guidance and IV sedation Pre-procedure pain score:  5/10 Post-procedure pain score: 0/10 (100% relief) Influential Factors: BMI: 34.95 kg/m Intra-procedural challenges: None observed.         Assessment challenges: None detected.              Reported side-effects: None.        Post-procedural adverse reactions or complications: None reported         Sedation: Sedation provided. When no sedatives are used,  the analgesic levels obtained are directly associated to the effectiveness of  the local anesthetics. However, when sedation is provided, the level of analgesia obtained during the initial 1 hour following the intervention, is believed to be the result of a combination of factors. These factors may include, but are not limited to: 1. The effectiveness of the local anesthetics used. 2. The effects of the analgesic(s) and/or anxiolytic(s) used. 3. The degree of discomfort experienced by the patient at the time of the procedure. 4. The patients ability and reliability in recalling and recording the events. 5. The presence and influence of possible secondary gains and/or psychosocial factors. Reported result: Relief experienced during the 1st hour after the procedure: 100 % (Ultra-Short Term Relief)            Interpretative annotation: Clinically appropriate result. Analgesia during this period is likely to be Local Anesthetic and/or IV Sedative (Analgesic/Anxiolytic) related.          Effects of local anesthetic: The analgesic effects attained during this period are directly associated to the localized infiltration of local anesthetics and therefore cary significant diagnostic value as to the etiological location, or anatomical origin, of the pain. Expected duration of relief is directly dependent on the pharmacodynamics of the local anesthetic used. Long-acting (4-6 hours) anesthetics used.  Reported result: Relief during the next 4 to 6 hour after the procedure: 100 % (Short-Term Relief)            Interpretative annotation: Clinically appropriate result. Analgesia during this period is likely to be Local Anesthetic-related.          Long-term benefit: Defined as the period of time past the expected duration of local anesthetics (1 hour for short-acting and 4-6 hours for long-acting). With the possible exception of prolonged sympathetic blockade from the local anesthetics, benefits during this period are typically attributed to, or associated with, other factors such as analgesic  sensory neuropraxia, antiinflammatory effects, or beneficial biochemical changes provided by agents other than the local anesthetics.  Reported result: Extended relief following procedure: 100 % (Long-Term Relief)            Interpretative annotation: Clinically appropriate result. Good relief. No permanent benefit expected. Inflammation plays a part in the etiology to the pain.          Current benefits: Defined as reported results that persistent at this point in time.   Analgesia: 90 % Shelly Hanson reports that both, extremity and the axial pain improved with the treatment. Function: Shelly Hanson reports improvement in function ROM: Shelly Hanson reports improvement in ROM Interpretative annotation: Ongoing benefit. Therapeutic success. Effective therapeutic approach.          Interpretation: Results would suggest a successful diagnostic intervention.                  Plan:  Proceed with Radiofrequency Ablation for the purpose of attaining long-term benefits.                Laboratory Chemistry  Inflammation Markers (CRP: Acute Phase) (ESR: Chronic Phase) Lab Results  Component Value Date   CRP <0.8 05/12/2016   ESRSEDRATE 4 05/12/2016                         Renal Function Markers Lab Results  Component Value Date   BUN 20 06/16/2017   CREATININE 0.88 06/16/2017   GFRAA >60 06/16/2017   GFRNONAA >60 06/16/2017  Hepatic Function Markers Lab Results  Component Value Date   AST 19 05/12/2016   ALT 16 05/12/2016   ALBUMIN 4.4 05/12/2016   ALKPHOS 71 05/12/2016   LIPASE 20 07/12/2015                        Electrolytes Lab Results  Component Value Date   NA 141 06/16/2017   K 3.8 06/16/2017   CL 105 06/16/2017   CALCIUM 9.0 06/16/2017   MG 2.0 05/12/2016                        Neuropathy Markers Lab Results  Component Value Date   VITAMINB12 381 05/12/2016                        Bone Pathology Markers Lab Results  Component Value Date    25OHVITD1 55 05/12/2016   25OHVITD2 51 05/12/2016   25OHVITD3 3.7 05/12/2016                         Coagulation Parameters Lab Results  Component Value Date   PLT 214 06/16/2017                        Cardiovascular Markers Lab Results  Component Value Date   HGB 14.8 06/16/2017   HCT 45.0 06/16/2017                         Note: Lab results reviewed.  Recent Diagnostic Imaging Results  DG C-Arm 1-60 Min-No Report Fluoroscopy was utilized by the requesting physician.  No radiographic  interpretation.   Complexity Note: I personally reviewed the fluoroscopic imaging of the procedure.                        Meds   Current Outpatient Medications:  .  dicyclomine (BENTYL) 10 MG capsule, TAKE 1 CAPSULE FOUR TIMES DAILY BEFORE MEALS  AND AT BEDTIME, Disp: 360 capsule, Rfl: 3 .  docusate sodium (COLACE) 100 MG capsule, Take 100 mg by mouth daily., Disp: , Rfl:  .  estradiol (ESTRACE) 1 MG tablet, Take 1 tablet (1 mg total) by mouth daily., Disp: 90 tablet, Rfl: 3 .  fluticasone (VERAMYST) 27.5 MCG/SPRAY nasal spray, Place 2 sprays into the nose daily., Disp: , Rfl:  .  gabapentin (NEURONTIN) 100 MG capsule, Take 200 mg by mouth 3 (three) times daily. , Disp: , Rfl:  .  Icosapent Ethyl (VASCEPA) 1 g CAPS, Take 2 capsules by mouth 2 (two) times daily. , Disp: , Rfl:  .  Magnesium 400 MG TABS, Take by mouth daily., Disp: , Rfl:  .  medroxyPROGESTERone (PROVERA) 5 MG tablet, TAKE 1 TABLET ONCE DAILY FOR 2 WEEKS. REPEAT AS DIRECTED EVERY THIRD MONTH., Disp: 14 tablet, Rfl: 0 .  meloxicam (MOBIC) 15 MG tablet, Take 15 mg by mouth daily., Disp: , Rfl:  .  omeprazole (PRILOSEC) 40 MG capsule, Take 40 mg by mouth daily., Disp: , Rfl:  .  ondansetron (ZOFRAN) 8 MG tablet, Take 1 tablet (8 mg total) by mouth every 8 (eight) hours as needed for nausea or vomiting., Disp: 20 tablet, Rfl: 1 .  ranitidine (ZANTAC) 150 MG tablet, Take 150 mg by mouth 2 (two) times daily., Disp: , Rfl:  .   rosuvastatin (CRESTOR) 20  MG tablet, Take 20 mg by mouth daily., Disp: , Rfl:  .  traMADol (ULTRAM) 50 MG tablet, TAKE 1 TABLET EVERY 6 HOURS AS NEEDED FOR MODERATE OR SEVERE PAIN, Disp: 30 tablet, Rfl: 3 .  Vitamin D, Ergocalciferol, (DRISDOL) 50000 UNITS CAPS capsule, Take 50,000 Units by mouth every 7 (seven) days. Takes on Saturdays., Disp: , Rfl:   ROS  Constitutional: Denies any fever or chills Gastrointestinal: No reported hemesis, hematochezia, vomiting, or acute GI distress Musculoskeletal: Denies any acute onset joint swelling, redness, loss of ROM, or weakness Neurological: No reported episodes of acute onset apraxia, aphasia, dysarthria, agnosia, amnesia, paralysis, loss of coordination, or loss of consciousness  Allergies  Shelly Hanson is allergic to codeine; flagyl [metronidazole]; naproxen; nitrofurantoin; and sulfa antibiotics.  [place]  Drug: Shelly Hanson  reports that she does not use drugs. Alcohol:  reports that she does not drink alcohol. Tobacco:  reports that she has been smoking cigarettes.  She has a 2.50 pack-year smoking history. She has never used smokeless tobacco. Medical:  has a past medical history of Abdominal wall pain in right lower quadrant (09/04/2014), GERD (gastroesophageal reflux disease), High cholesterol, Hyperlipemia, IBS (irritable bowel syndrome), Lumbar facet joint syndrome, Migraine headache (12/18/2014), Pinched nerve, Pinched nerve in neck, Sciatic leg pain, and Vitamin D deficiency disease. Surgical: Shelly Hanson  has a past surgical history that includes Foot surgery; Cholecystectomy; Dilation and curettage of uterus; Colonoscopy (N/A, 11/17/2013); Esophagogastroduodenoscopy (N/A, 11/17/2013); Balloon dilation (N/A, 11/17/2013); maloney dilation (N/A, 11/17/2013); Savory dilation (N/A, 11/17/2013); and Carpal tunnel release. Family: family history includes Cancer in her father; Colon cancer in her father; Depression in her mother; Diabetes in her mother; Heart  disease in her brother; Hyperlipidemia in her brother, brother, and mother; Hypertension in her father; Seizures in her father.  Constitutional Exam  General appearance: Well nourished, well developed, and well hydrated. In no apparent acute distress Vitals:   08/02/17 1337  BP: 120/72  Pulse: 78  Resp: 16  Temp: 98 F (36.7 C)  TempSrc: Oral  SpO2: 98%  Weight: 210 lb (95.3 kg)  Height: 5' 5" (1.651 m)   BMI Assessment: Estimated body mass index is 34.95 kg/m as calculated from the following:   Height as of this encounter: 5' 5" (1.651 m).   Weight as of this encounter: 210 lb (95.3 kg).  BMI interpretation table: BMI level Category Range association with higher incidence of chronic pain  <18 kg/m2 Underweight   18.5-24.9 kg/m2 Ideal body weight   25-29.9 kg/m2 Overweight Increased incidence by 20%  30-34.9 kg/m2 Obese (Class I) Increased incidence by 68%  35-39.9 kg/m2 Severe obesity (Class II) Increased incidence by 136%  >40 kg/m2 Extreme obesity (Class III) Increased incidence by 254%   BMI Readings from Last 4 Encounters:  08/02/17 34.95 kg/m  07/13/17 34.95 kg/m  06/16/17 33.89 kg/m  04/27/17 35.11 kg/m   Wt Readings from Last 4 Encounters:  08/02/17 210 lb (95.3 kg)  07/13/17 210 lb (95.3 kg)  06/16/17 210 lb (95.3 kg)  04/27/17 211 lb (95.7 kg)  Psych/Mental status: Alert, oriented x 3 (person, place, & time)       Eyes: PERLA Respiratory: No evidence of acute respiratory distress  Cervical Spine Area Exam  Skin & Axial Inspection: No masses, redness, edema, swelling, or associated skin lesions Alignment: Symmetrical Functional ROM: Unrestricted ROM      Stability: No instability detected Muscle Tone/Strength: Functionally intact. No obvious neuro-muscular anomalies detected. Sensory (Neurological): Unimpaired Palpation: No palpable  anomalies              Upper Extremity (UE) Exam    Side: Right upper extremity  Side: Left upper extremity  Skin &  Extremity Inspection: Skin color, temperature, and hair growth are WNL. No peripheral edema or cyanosis. No masses, redness, swelling, asymmetry, or associated skin lesions. No contractures.  Skin & Extremity Inspection: Skin color, temperature, and hair growth are WNL. No peripheral edema or cyanosis. No masses, redness, swelling, asymmetry, or associated skin lesions. No contractures.  Functional ROM: Unrestricted ROM          Functional ROM: Unrestricted ROM          Muscle Tone/Strength: Functionally intact. No obvious neuro-muscular anomalies detected.  Muscle Tone/Strength: Functionally intact. No obvious neuro-muscular anomalies detected.  Sensory (Neurological): Unimpaired          Sensory (Neurological): Unimpaired          Palpation: No palpable anomalies              Palpation: No palpable anomalies              Specialized Test(s): Deferred         Specialized Test(s): Deferred          Thoracic Spine Area Exam  Skin & Axial Inspection: No masses, redness, or swelling Alignment: Symmetrical Functional ROM: Unrestricted ROM Stability: No instability detected Muscle Tone/Strength: Functionally intact. No obvious neuro-muscular anomalies detected. Sensory (Neurological): Unimpaired Muscle strength & Tone: No palpable anomalies  Lumbar Spine Area Exam  Skin & Axial Inspection: No masses, redness, or swelling Alignment: Symmetrical Functional ROM: Improved after treatment      Stability: No instability detected Muscle Tone/Strength: Functionally intact. No obvious neuro-muscular anomalies detected. Sensory (Neurological): Improved Palpation: No complaints of tenderness       Provocative Tests: Lumbar Hyperextension and rotation test: evaluation deferred today       Lumbar Lateral bending test: evaluation deferred today       Patrick's Maneuver: evaluation deferred today                    Gait & Posture Assessment  Ambulation: Unassisted Gait: Relatively normal for age and body  habitus Posture: WNL   Lower Extremity Exam    Side: Right lower extremity  Side: Left lower extremity  Skin & Extremity Inspection: Skin color, temperature, and hair growth are WNL. No peripheral edema or cyanosis. No masses, redness, swelling, asymmetry, or associated skin lesions. No contractures.  Skin & Extremity Inspection: Skin color, temperature, and hair growth are WNL. No peripheral edema or cyanosis. No masses, redness, swelling, asymmetry, or associated skin lesions. No contractures.  Functional ROM: Unrestricted ROM          Functional ROM: Unrestricted ROM          Muscle Tone/Strength: Functionally intact. No obvious neuro-muscular anomalies detected.  Muscle Tone/Strength: Functionally intact. No obvious neuro-muscular anomalies detected.  Sensory (Neurological): Unimpaired  Sensory (Neurological): Unimpaired  Palpation: No palpable anomalies  Palpation: No palpable anomalies   Assessment  Primary Diagnosis & Pertinent Problem List: The primary encounter diagnosis was Spondylosis without myelopathy or radiculopathy, lumbosacral region. Diagnoses of Lumbar facet syndrome (Bilateral) (R>L), Lumbar facet hypertrophy (multilevel), and Chronic low back pain (Location of Primary Source of Pain) (Midline) (Bilateral) (R>L) were also pertinent to this visit.  Status Diagnosis  Controlled Controlled Controlled 1. Spondylosis without myelopathy or radiculopathy, lumbosacral region   2. Lumbar facet syndrome (Bilateral) (R>L)  3. Lumbar facet hypertrophy (multilevel)   4. Chronic low back pain (Location of Primary Source of Pain) (Midline) (Bilateral) (R>L)     Problems updated and reviewed during this visit: No problems updated. Plan of Care  Pharmacotherapy (Medications Ordered): No orders of the defined types were placed in this encounter.  Medications administered today: Shelly Hanson had no medications administered during this visit.   Procedure Orders     LUMBAR  FACET(MEDIAL BRANCH NERVE BLOCK) [facility] Lab Orders  No laboratory test(s) ordered today   Imaging Orders  No imaging studies ordered today    Referral Orders     Ambulatory referral to Physical Therapy  Interventional management options: Planned, scheduled, and/or pending:   Physical therapy trial for the patient's low back pain.  If the patient fails this trial, then we will proceed with radiofrequency of the lumbar facets.  Meanwhile, if the patient has a flareup of her low back pain we will bring her back for a palliative bilateral lumbar facet block under fluoroscopic guidance and IV sedation   Considering:   Diagnostic bilateral-sided lumbar facet block #3  Possible bilateral-sided lumbar facet RFA  Diagnostic Right L4-5 lumbar epidural steroid injection  Diagnostic Right cervical epidural steroid injection    Palliative PRN treatment(s):   Palliative bilateral lumbar facet block #3 under fluoroscopic guidance and IV sedation   Provider-requested follow-up: Return for PRN Procedure (w/ sedation): (B) L-FCT BLK #3.  Future Appointments  Date Time Provider [place]  02/08/2018 11:30 AM Vaughan Basta, Rona Ravens, NP [facility] None   Primary Care Physician: Vesta Mixer Location: [facility] Note by: Gaspar Cola, MD Date: 08/02/2017; Time: 2:06 PM

## 2017-08-02 ENCOUNTER — Ambulatory Visit: Payer: Medicare HMO | Attending: Pain Medicine | Admitting: Pain Medicine

## 2017-08-02 ENCOUNTER — Encounter: Payer: Self-pay | Admitting: Pain Medicine

## 2017-08-02 VITALS — BP 120/72 | HR 78 | Temp 98.0°F | Resp 16 | Ht 65.0 in | Wt 210.0 lb

## 2017-08-02 DIAGNOSIS — Z886 Allergy status to analgesic agent status: Secondary | ICD-10-CM | POA: Diagnosis not present

## 2017-08-02 DIAGNOSIS — Z882 Allergy status to sulfonamides status: Secondary | ICD-10-CM | POA: Diagnosis not present

## 2017-08-02 DIAGNOSIS — M47816 Spondylosis without myelopathy or radiculopathy, lumbar region: Secondary | ICD-10-CM | POA: Insufficient documentation

## 2017-08-02 DIAGNOSIS — Z881 Allergy status to other antibiotic agents status: Secondary | ICD-10-CM | POA: Insufficient documentation

## 2017-08-02 DIAGNOSIS — K589 Irritable bowel syndrome without diarrhea: Secondary | ICD-10-CM | POA: Diagnosis not present

## 2017-08-02 DIAGNOSIS — E7800 Pure hypercholesterolemia, unspecified: Secondary | ICD-10-CM | POA: Diagnosis not present

## 2017-08-02 DIAGNOSIS — Z885 Allergy status to narcotic agent status: Secondary | ICD-10-CM | POA: Diagnosis not present

## 2017-08-02 DIAGNOSIS — Z79899 Other long term (current) drug therapy: Secondary | ICD-10-CM | POA: Diagnosis not present

## 2017-08-02 DIAGNOSIS — K219 Gastro-esophageal reflux disease without esophagitis: Secondary | ICD-10-CM | POA: Insufficient documentation

## 2017-08-02 DIAGNOSIS — M5442 Lumbago with sciatica, left side: Secondary | ICD-10-CM | POA: Insufficient documentation

## 2017-08-02 DIAGNOSIS — M5441 Lumbago with sciatica, right side: Secondary | ICD-10-CM | POA: Diagnosis not present

## 2017-08-02 DIAGNOSIS — Z9049 Acquired absence of other specified parts of digestive tract: Secondary | ICD-10-CM | POA: Insufficient documentation

## 2017-08-02 DIAGNOSIS — F17210 Nicotine dependence, cigarettes, uncomplicated: Secondary | ICD-10-CM | POA: Insufficient documentation

## 2017-08-02 DIAGNOSIS — M47817 Spondylosis without myelopathy or radiculopathy, lumbosacral region: Secondary | ICD-10-CM | POA: Diagnosis present

## 2017-08-02 DIAGNOSIS — Z9889 Other specified postprocedural states: Secondary | ICD-10-CM | POA: Diagnosis not present

## 2017-08-02 DIAGNOSIS — G8929 Other chronic pain: Secondary | ICD-10-CM

## 2017-08-02 DIAGNOSIS — E559 Vitamin D deficiency, unspecified: Secondary | ICD-10-CM | POA: Diagnosis not present

## 2017-08-02 DIAGNOSIS — Z79891 Long term (current) use of opiate analgesic: Secondary | ICD-10-CM | POA: Diagnosis not present

## 2017-08-02 NOTE — Patient Instructions (Addendum)
____________________________________________________________________________________________  Preparing for Procedure with Sedation  Instructions: . Oral Intake: Do not eat or drink anything for at least 8 hours prior to your procedure. . Transportation: Public transportation is not allowed. Bring an adult driver. The driver must be physically present in our waiting room before any procedure can be started. Marland Kitchen Physical Assistance: Bring an adult physically capable of assisting you, in the event you need help. This adult should keep you company at home for at least 6 hours after the procedure. . Blood Pressure Medicine: Take your blood pressure medicine with a sip of water the morning of the procedure. . Blood thinners:  . Diabetics on insulin: Notify the staff so that you can be scheduled 1st case in the morning. If your diabetes requires high dose insulin, take only  of your normal insulin dose the morning of the procedure and notify the staff that you have done so. . Preventing infections: Shower with an antibacterial soap the morning of your procedure. . Build-up your immune system: Take 1000 mg of Vitamin C with every meal (3 times a day) the day prior to your procedure. Marland Kitchen Antibiotics: Inform the staff if you have a condition or reason that requires you to take antibiotics before dental procedures. . Pregnancy: If you are pregnant, call and cancel the procedure. . Sickness: If you have a cold, fever, or any active infections, call and cancel the procedure. . Arrival: You must be in the facility at least 30 minutes prior to your scheduled procedure. . Children: Do not bring children with you. . Dress appropriately: Bring dark clothing that you would not mind if they get stained. . Valuables: Do not bring any jewelry or valuables.  Procedure appointments are reserved for interventional treatments only. Marland Kitchen No Prescription Refills. . No medication changes will be discussed during procedure  appointments. . No disability issues will be discussed.  Remember:  Regular Business hours are:  Monday to Thursday 8:00 AM to 4:00 PM  Provider's Schedule: Milinda Pointer, MD:  Procedure days: Tuesday and Thursday 7:30 AM to 4:00 PM  Gillis Santa, MD:  Procedure days: Monday and Wednesday 7:30 AM to 4:00 PM ____________________________________________________________________________________________   Radiofrequency Lesioning Radiofrequency lesioning is a procedure that is performed to relieve pain. The procedure is often used for back, neck, or arm pain. Radiofrequency lesioning involves the use of a machine that creates radio waves to make heat. During the procedure, the heat is applied to the nerve that carries the pain signal. The heat damages the nerve and interferes with the pain signal. Pain relief usually starts about 2 weeks after the procedure and lasts for 6 months to 1 year. Tell a health care provider about:  Any allergies you have.  All medicines you are taking, including vitamins, herbs, eye drops, creams, and over-the-counter medicines.  Any problems you or family members have had with anesthetic medicines.  Any blood disorders you have.  Any surgeries you have had.  Any medical conditions you have.  Whether you are pregnant or may be pregnant. What are the risks? Generally, this is a safe procedure. However, problems may occur, including:  Pain or soreness at the injection site.  Infection at the injection site.  Damage to nerves or blood vessels.  What happens before the procedure?  Ask your health care provider about: ? Changing or stopping your regular medicines. This is especially important if you are taking diabetes medicines or blood thinners. ? Taking medicines such as aspirin and ibuprofen. These  medicines can thin your blood. Do not take these medicines before your procedure if your health care provider instructs you not to.  Follow  instructions from your health care provider about eating or drinking restrictions.  Plan to have someone take you home after the procedure.  If you go home right after the procedure, plan to have someone with you for 24 hours. What happens during the procedure?  You will be given one or more of the following: ? A medicine to help you relax (sedative). ? A medicine to numb the area (local anesthetic).  You will be awake during the procedure. You will need to be able to talk with the health care provider during the procedure.  With the help of a type of X-ray (fluoroscopy), the health care provider will insert a radiofrequency needle into the area to be treated.  Next, a wire that carries the radio waves (electrode) will be put through the radiofrequency needle. An electrical pulse will be sent through the electrode to verify the correct nerve. You will feel a tingling sensation, and you may have muscle twitching.  Then, the tissue that is around the needle tip will be heated by an electric current that is passed using the radiofrequency machine. This will numb the nerves.  A bandage (dressing) will be put on the insertion area after the procedure is done. The procedure may vary among health care providers and hospitals. What happens after the procedure?  Your blood pressure, heart rate, breathing rate, and blood oxygen level will be monitored often until the medicines you were given have worn off.  Return to your normal activities as directed by your health care provider. This information is not intended to replace advice given to you by your health care provider. Make sure you discuss any questions you have with your health care provider. Document Released: 12/03/2010 Document Revised: 09/12/2015 Document Reviewed: 05/14/2014 Elsevier Interactive Patient Education  2018 [employer] Facet Blocks Patient Information  Description: The facets are joints in the spine between the  vertebrae.  Like any joints in the body, facets can become irritated and painful.  Arthritis can also effect the facets.  By injecting steroids and local anesthetic in and around these joints, we can temporarily block the nerve supply to them.  Steroids act directly on irritated nerves and tissues to reduce selling and inflammation which often leads to decreased pain.  Facet blocks may be done anywhere along the spine from the neck to the low back depending upon the location of your pain.   After numbing the skin with local anesthetic (like Novocaine), a small needle is passed onto the facet joints under x-ray guidance.  You may experience a sensation of pressure while this is being done.  The entire block usually lasts about 15-25 minutes.   Conditions which may be treated by facet blocks:   Low back/buttock pain  Neck/shoulder pain  Certain types of headaches  Preparation for the injection:  1. Do not eat any solid food or dairy products within 8 hours of your appointment. 2. You may drink clear liquid up to 3 hours before appointment.  Clear liquids include water, black coffee, juice or soda.  No milk or cream please. 3. You may take your regular medication, including pain medications, with a sip of water before your appointment.  Diabetics should hold regular insulin (if taken separately) and take 1/2 normal NPH dose the morning of the procedure.  Carry some sugar containing items with you  to your appointment. 4. A driver must accompany you and be prepared to drive you home after your procedure. 5. Bring all your current medications with you. 6. An IV may be inserted and sedation may be given at the discretion of the physician. 7. A blood pressure cuff, EKG and other monitors will often be applied during the procedure.  Some patients may need to have extra oxygen administered for a short period. 8. You will be asked to provide medical information, including your allergies and medications,  prior to the procedure.  We must know immediately if you are taking blood thinners (like Coumadin/Warfarin) or if you are allergic to IV iodine contrast (dye).  We must know if you could possible be pregnant.  Possible side-effects:   Bleeding from needle site  Infection (rare, may require surgery)  Nerve injury (rare)  Numbness & tingling (temporary)  Difficulty urinating (rare, temporary)  Spinal headache (a headache worse with upright posture)  Light-headedness (temporary)  Pain at injection site (serveral days)  Decreased blood pressure (rare, temporary)  Weakness in arm/leg (temporary)  Pressure sensation in back/neck (temporary)   Call if you experience:   Fever/chills associated with headache or increased back/neck pain  Headache worsened by an upright position  New onset, weakness or numbness of an extremity below the injection site  Hives or difficulty breathing (go to the emergency room)  Inflammation or drainage at the injection site(s)  Severe back/neck pain greater than usual  New symptoms which are concerning to you  Please note:  Although the local anesthetic injected can often make your back or neck feel good for several hours after the injection, the pain will likely return. It takes 3-7 days for steroids to work.  You may not notice any pain relief for at least one week.  If effective, we will often do a series of 2-3 injections spaced 3-6 weeks apart to maximally decrease your pain.  After the initial series, you may be a candidate for a more permanent nerve block of the facets.  If you have any questions, please call #[phone] [facility]

## 2017-08-31 DIAGNOSIS — L089 Local infection of the skin and subcutaneous tissue, unspecified: Secondary | ICD-10-CM | POA: Insufficient documentation

## 2017-09-08 DIAGNOSIS — S41101S Unspecified open wound of right upper arm, sequela: Secondary | ICD-10-CM | POA: Insufficient documentation

## 2017-09-20 ENCOUNTER — Other Ambulatory Visit: Payer: Self-pay | Admitting: Obstetrics and Gynecology

## 2017-09-22 DIAGNOSIS — S52602D Unspecified fracture of lower end of left ulna, subsequent encounter for closed fracture with routine healing: Secondary | ICD-10-CM | POA: Insufficient documentation

## 2017-11-18 ENCOUNTER — Other Ambulatory Visit: Payer: Self-pay | Admitting: Obstetrics and Gynecology

## 2017-11-18 ENCOUNTER — Telehealth: Payer: Self-pay | Admitting: Obstetrics and Gynecology

## 2017-11-18 NOTE — Telephone Encounter (Signed)
Bina called from [facility] wanted to check the  status of a request for 2 meds that was sent to us.   (Meloxicam 15mg  and Medrozyprogesternone 5mg )

## 2017-11-22 NOTE — Telephone Encounter (Signed)
No requests received for these meds. [email or web address]

## 2017-12-02 ENCOUNTER — Other Ambulatory Visit: Payer: Self-pay | Admitting: Obstetrics and Gynecology

## 2017-12-04 ENCOUNTER — Encounter: Payer: Self-pay | Admitting: Obstetrics and Gynecology

## 2017-12-04 NOTE — Telephone Encounter (Signed)
Pt needs apt

## 2017-12-27 ENCOUNTER — Other Ambulatory Visit: Payer: Self-pay | Admitting: Obstetrics and Gynecology

## 2017-12-27 DIAGNOSIS — Z1231 Encounter for screening mammogram for malignant neoplasm of breast: Secondary | ICD-10-CM

## 2018-01-17 ENCOUNTER — Other Ambulatory Visit: Payer: Self-pay | Admitting: Obstetrics and Gynecology

## 2018-01-24 ENCOUNTER — Encounter (HOSPITAL_COMMUNITY): Payer: Self-pay

## 2018-01-24 ENCOUNTER — Ambulatory Visit (HOSPITAL_COMMUNITY)
Admission: RE | Admit: 2018-01-24 | Discharge: 2018-01-24 | Disposition: A | Discharge status: Home / Self Care | Payer: Medicare HMO | Source: Ambulatory Visit | Attending: Obstetrics and Gynecology | Admitting: Obstetrics and Gynecology

## 2018-01-24 DIAGNOSIS — Z1231 Encounter for screening mammogram for malignant neoplasm of breast: Secondary | ICD-10-CM | POA: Diagnosis not present

## 2018-02-07 DIAGNOSIS — M65312 Trigger thumb, left thumb: Secondary | ICD-10-CM | POA: Insufficient documentation

## 2018-02-08 ENCOUNTER — Ambulatory Visit (INDEPENDENT_AMBULATORY_CARE_PROVIDER_SITE_OTHER): Payer: Medicare HMO | Admitting: Internal Medicine

## 2018-02-08 ENCOUNTER — Encounter (INDEPENDENT_AMBULATORY_CARE_PROVIDER_SITE_OTHER): Payer: Self-pay | Admitting: Internal Medicine

## 2018-02-08 VITALS — BP 118/80 | HR 60 | Temp 98.1°F | Ht 65.5 in | Wt 210.6 lb

## 2018-02-08 DIAGNOSIS — R1319 Other dysphagia: Secondary | ICD-10-CM

## 2018-02-08 DIAGNOSIS — R1310 Dysphagia, unspecified: Secondary | ICD-10-CM

## 2018-02-08 DIAGNOSIS — K588 Other irritable bowel syndrome: Secondary | ICD-10-CM

## 2018-02-08 NOTE — Patient Instructions (Signed)
OV in 1 year.  
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
## 2018-02-14 ENCOUNTER — Other Ambulatory Visit: Payer: Self-pay | Admitting: Obstetrics and Gynecology

## 2018-02-18 ENCOUNTER — Other Ambulatory Visit: Payer: Self-pay | Admitting: Obstetrics and Gynecology

## 2018-02-18 NOTE — Telephone Encounter (Signed)
refil x 30 days, will expect. pt to keep f/u appt to discuss management of meds

## 2018-02-18 NOTE — Telephone Encounter (Signed)
Perfect

## 2018-02-25 ENCOUNTER — Encounter: Payer: Self-pay | Admitting: Adult Health

## 2018-02-25 ENCOUNTER — Ambulatory Visit (INDEPENDENT_AMBULATORY_CARE_PROVIDER_SITE_OTHER): Payer: Medicare HMO | Admitting: Adult Health

## 2018-02-25 VITALS — BP 126/88 | HR 75 | Ht 65.0 in | Wt 211.0 lb

## 2018-02-25 DIAGNOSIS — Z79890 Hormone replacement therapy (postmenopausal): Secondary | ICD-10-CM

## 2018-02-25 MED ORDER — ESTRADIOL 1 MG PO TABS: 1.0000 mg | ORAL_TABLET | Freq: Every day | ORAL | 3 refills | Status: DC

## 2018-02-25 MED ORDER — MEDROXYPROGESTERONE ACETATE 5 MG PO TABS: ORAL_TABLET | ORAL | 3 refills | Status: DC

## 2018-02-25 NOTE — Progress Notes (Addendum)
  Subjective:     Patient ID: Shelly Hanson, female   DOB: March 25, 1961, 57 y.o.   MRN: [id number]  HPI Shelly Hanson is a 56 year old white female in to get refills on HRT. PCP is [facility].  Review of Systems Occasional hot flash No bleeding  Reviewed past medical,surgical, social and family history. Reviewed medications and allergies.     Objective:   Physical Exam BP 126/88 (BP Location: Left Arm, Patient Position: Sitting, Cuff Size: Normal)   Pulse 75   Ht 5\' 5"  (1.651 m)   Wt 211 lb (95.7 kg)   BMI 35.11 kg/m  Skin warm and dry.  Lungs: clear to ausculation bilaterally. Cardiovascular: regular rate and rhythm. Discussed that needs to make sure taking provera. Will refill meds as before with Dr Emelda Fear.    Assessment:     HRT    Plan:     Meds ordered this encounter  Medications  . estradiol (ESTRACE) 1 MG tablet    Sig: Take 1 tablet (1 mg total) by mouth daily.    Dispense:  30 tablet    Refill:  3    Order Specific Question:   Supervising Provider    Answer:   Despina Hidden, LUTHER H [[id number]]  . medroxyPROGESTERone (PROVERA) 5 MG tablet    Sig: TAKE 1 TABLET ONCE DAILY FOR 2 WEEKS. REPEAT AS DIRECTED EVERY THIRD MONTH.    Dispense:  14 tablet    Refill:  3    Order Specific Question:   Supervising Provider    Answer:   Lazaro Arms [[id number]]  Return in February for pap and physical

## 2018-03-22 ENCOUNTER — Other Ambulatory Visit: Payer: Self-pay | Admitting: Obstetrics and Gynecology

## 2018-05-13 ENCOUNTER — Other Ambulatory Visit: Payer: Self-pay | Admitting: Obstetrics and Gynecology

## 2018-05-24 ENCOUNTER — Other Ambulatory Visit: Payer: Self-pay | Admitting: Adult Health

## 2018-06-02 ENCOUNTER — Encounter: Payer: Self-pay | Admitting: Adult Health

## 2018-06-02 ENCOUNTER — Ambulatory Visit (INDEPENDENT_AMBULATORY_CARE_PROVIDER_SITE_OTHER): Payer: Medicare HMO | Admitting: Adult Health

## 2018-06-02 ENCOUNTER — Other Ambulatory Visit (HOSPITAL_COMMUNITY)
Admission: RE | Admit: 2018-06-02 | Discharge: 2018-06-02 | Disposition: A | Discharge status: Home / Self Care | Payer: Medicare HMO | Source: Ambulatory Visit | Attending: Adult Health | Admitting: Adult Health

## 2018-06-02 VITALS — BP 118/85 | HR 76 | Ht 64.25 in | Wt 213.5 lb

## 2018-06-02 DIAGNOSIS — Z01419 Encounter for gynecological examination (general) (routine) without abnormal findings: Secondary | ICD-10-CM | POA: Insufficient documentation

## 2018-06-02 DIAGNOSIS — Z1212 Encounter for screening for malignant neoplasm of rectum: Secondary | ICD-10-CM

## 2018-06-02 DIAGNOSIS — Z124 Encounter for screening for malignant neoplasm of cervix: Secondary | ICD-10-CM | POA: Diagnosis present

## 2018-06-02 DIAGNOSIS — Z1211 Encounter for screening for malignant neoplasm of colon: Secondary | ICD-10-CM

## 2018-06-02 DIAGNOSIS — N951 Menopausal and female climacteric states: Secondary | ICD-10-CM

## 2018-06-02 DIAGNOSIS — Z79890 Hormone replacement therapy (postmenopausal): Secondary | ICD-10-CM

## 2018-06-02 LAB — HEMOCCULT GUIAC POC 1CARD (OFFICE): Fecal Occult Blood, POC: NEGATIVE

## 2018-06-02 MED ORDER — ESTRADIOL 1 MG PO TABS: ORAL_TABLET | ORAL | 12 refills | Status: DC

## 2018-06-02 MED ORDER — MEDROXYPROGESTERONE ACETATE 5 MG PO TABS: ORAL_TABLET | ORAL | 3 refills | Status: DC
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
## 2018-06-07 LAB — CYTOLOGY - PAP
Adequacy: ABSENT
Chlamydia: NEGATIVE
Diagnosis: NEGATIVE
HPV: NOT DETECTED
NEISSERIA GONORRHEA: NEGATIVE

## 2018-06-09 ENCOUNTER — Other Ambulatory Visit (INDEPENDENT_AMBULATORY_CARE_PROVIDER_SITE_OTHER): Payer: Self-pay | Admitting: Internal Medicine

## 2018-06-09 DIAGNOSIS — Z8719 Personal history of other diseases of the digestive system: Secondary | ICD-10-CM

## 2018-06-17 ENCOUNTER — Other Ambulatory Visit: Payer: Self-pay | Admitting: Obstetrics and Gynecology

## 2018-06-20 NOTE — Telephone Encounter (Signed)
Note sent to nurse. 

## 2018-09-08 ENCOUNTER — Other Ambulatory Visit: Payer: Self-pay | Admitting: Obstetrics and Gynecology

## 2018-09-09 NOTE — Telephone Encounter (Signed)
Ondansetron Refil sent in.

## 2018-11-25 ENCOUNTER — Other Ambulatory Visit: Payer: Self-pay | Admitting: Obstetrics and Gynecology

## 2018-11-25 NOTE — Telephone Encounter (Signed)
Refil ondansetron

## 2018-11-28 ENCOUNTER — Encounter (INDEPENDENT_AMBULATORY_CARE_PROVIDER_SITE_OTHER): Payer: Self-pay | Admitting: *Deleted

## 2018-12-01 ENCOUNTER — Telehealth: Payer: Self-pay | Admitting: Obstetrics and Gynecology

## 2018-12-01 NOTE — Telephone Encounter (Signed)
Pt states that her medication for hot flashes is not working. Also the states that when she takes the Provera she notices that she is having a lot of cramps and is wanting to know if she should continue with this medication.

## 2018-12-05 ENCOUNTER — Telehealth: Payer: Self-pay | Admitting: Obstetrics and Gynecology

## 2018-12-05 NOTE — Telephone Encounter (Signed)
Patient is calling back because she didn't get a call back on 12/01/18.  She also wanted to add that she is spotting.  [phone]

## 2018-12-06 NOTE — Telephone Encounter (Signed)
Left message to call and schedule an appt with Dr. Glo Herring. [place]

## 2018-12-07 ENCOUNTER — Ambulatory Visit: Payer: Medicare HMO | Admitting: Obstetrics and Gynecology

## 2018-12-12 ENCOUNTER — Telehealth: Payer: Self-pay | Admitting: Obstetrics and Gynecology

## 2018-12-12 ENCOUNTER — Other Ambulatory Visit: Payer: Self-pay | Admitting: Obstetrics and Gynecology

## 2018-12-12 MED ORDER — ESTRADIOL 0.1 MG/24HR TD PTTW: 1.0000 | MEDICATED_PATCH | TRANSDERMAL | 12 refills | Status: DC

## 2018-12-12 MED ORDER — PROGESTERONE MICRONIZED 200 MG PO CAPS: 200.0000 mg | ORAL_CAPSULE | Freq: Every day | ORAL | 2 refills | Status: DC

## 2018-12-12 NOTE — Telephone Encounter (Signed)
Unable to reach pt by phone Pt asked to call and either speak to me directly or set up a telephone visit for the morning.

## 2018-12-12 NOTE — Progress Notes (Signed)
Pt desires to try to eliminate menses She just had a heavy menses and lots of cramping. Pt offered Vivelle Dot and prometrium, will attempt this regimen.  I a m unable to view cost in advance. Pt will notify me if too expensive.

## 2018-12-13 ENCOUNTER — Telehealth: Payer: Self-pay | Admitting: Obstetrics and Gynecology

## 2018-12-13 ENCOUNTER — Telehealth: Payer: Self-pay | Admitting: *Deleted

## 2018-12-13 NOTE — Telephone Encounter (Signed)
Patient informed prescription was faxed to pharmacy today.

## 2018-12-13 NOTE — Telephone Encounter (Signed)
Patient called, stated that Dr. Glo Herring told her yesterday that he would call in a patch for her.  She mentioned that Dr. Glo Herring was going to check with [facility]?  [place]

## 2018-12-14 ENCOUNTER — Ambulatory Visit: Payer: Medicare HMO | Admitting: Student

## 2018-12-28 ENCOUNTER — Telehealth: Payer: Self-pay | Admitting: *Deleted

## 2018-12-28 NOTE — Telephone Encounter (Signed)
Patient reports that she is on the patch and progesterone. She noticed a little spotting on Saturday when she applied the patch. None since that time. She also notices some pressure with urination, no pain or burning. Also is having pain in her right ovary with sex. She just wanted to let Dr. Glo Herring know about these things and see if they will go away with time.

## 2018-12-29 ENCOUNTER — Other Ambulatory Visit (HOSPITAL_COMMUNITY): Payer: Self-pay | Admitting: Obstetrics and Gynecology

## 2018-12-29 DIAGNOSIS — Z1231 Encounter for screening mammogram for malignant neoplasm of breast: Secondary | ICD-10-CM

## 2018-12-30 ENCOUNTER — Telehealth: Payer: Self-pay | Admitting: *Deleted

## 2018-12-30 NOTE — Telephone Encounter (Signed)
Follow for now

## 2018-12-30 NOTE — Telephone Encounter (Signed)
Patient states she was started on some medication by Dr Glo Herring and has a few concerns.  She has spotted once, pain in right ovary during intercourse and pressure.  Wants to know if these will pass soon with medication.

## 2018-12-30 NOTE — Telephone Encounter (Signed)
Spoke with patient and addressed all her concerns. She had no other questions at this time.

## 2019-01-03 ENCOUNTER — Telehealth: Payer: Self-pay | Admitting: *Deleted

## 2019-01-03 NOTE — Telephone Encounter (Signed)
States she has new symptoms and would like to discuss a decision she has made.

## 2019-01-03 NOTE — Telephone Encounter (Signed)
Patient was last seen 6 months ago. Has had over 10 phone message interactions since. Please have pt make Televisit

## 2019-01-03 NOTE — Telephone Encounter (Signed)
Patient states with the patch she initially started having some spotting then she had pain in her ovaries during intercourse.  She is now having extreme nipple tenderness and is just not happy with the side effects of the estradiol patch. States she is not having any hot flashes currently.  She has decided to stop taking the medication.  Could she decrease the administration of the patch to 1 patch weekly to help with her side effects?

## 2019-01-30 ENCOUNTER — Ambulatory Visit (HOSPITAL_COMMUNITY): Payer: Medicare HMO

## 2019-02-09 ENCOUNTER — Ambulatory Visit (INDEPENDENT_AMBULATORY_CARE_PROVIDER_SITE_OTHER): Payer: Medicare HMO | Admitting: Nurse Practitioner

## 2019-03-01 ENCOUNTER — Ambulatory Visit (HOSPITAL_COMMUNITY)
Admission: RE | Admit: 2019-03-01 | Discharge: 2019-03-01 | Disposition: A | Discharge status: Home / Self Care | Payer: Medicare HMO | Source: Ambulatory Visit | Attending: Obstetrics and Gynecology | Admitting: Obstetrics and Gynecology

## 2019-03-01 ENCOUNTER — Other Ambulatory Visit: Payer: Self-pay

## 2019-03-01 DIAGNOSIS — Z1231 Encounter for screening mammogram for malignant neoplasm of breast: Secondary | ICD-10-CM | POA: Diagnosis present

## 2019-03-02 ENCOUNTER — Other Ambulatory Visit (INDEPENDENT_AMBULATORY_CARE_PROVIDER_SITE_OTHER): Payer: Self-pay | Admitting: Nurse Practitioner

## 2019-03-02 ENCOUNTER — Encounter: Payer: Self-pay | Admitting: Pain Medicine

## 2019-03-02 ENCOUNTER — Telehealth (INDEPENDENT_AMBULATORY_CARE_PROVIDER_SITE_OTHER): Payer: Self-pay | Admitting: Nurse Practitioner

## 2019-03-02 DIAGNOSIS — Z8719 Personal history of other diseases of the digestive system: Secondary | ICD-10-CM

## 2019-03-02 MED ORDER — DICYCLOMINE HCL 10 MG PO CAPS: 10.0000 mg | ORAL_CAPSULE | Freq: Three times a day (TID) | ORAL | 0 refills | Status: DC | PRN

## 2019-03-02 NOTE — Telephone Encounter (Signed)
Chelsea from [facility] called regarding a refill on patients dicyclomine 10 mg - any questions ph# [phone]

## 2019-03-02 NOTE — Progress Notes (Signed)
Pain relief after procedure (treated area only): (Questions asked to patient) 1. Starting about 15 minutes after the procedure, and "while the area was still numb" (from the local anesthetics), were you having any of your usual pain "in that area"? (NOT including the discomfort from the needle sticks.) First 1 hour: can't remember % better. First 4-6 hours: 80 % better. 2. Assuming that it did get numb. How long was the area numb? 80 % benefit, longer than 6 hours. How long? 1 month. 3. How much better is your pain now, when compared to before the procedure? Current benefit: 0 % better. 4. Can you move better now? Improvement in ROM (Range of Motion): Yes. 5. Can you do more now? Improvement in function: Yes. 4. Did you have any problems with the procedure? Side-effects/Complications: No.Pain relief after procedure (treated area only): (Questions asked to patient) 1. Starting about 15 minutes after the procedure, and "while the area was still numb" (from the local anesthetics), were you having any of your usual pain "in that area"? (NOT including the discomfort from the needle sticks.) First 1 hour: . % better. First 4-6 hours: . % better. 2. Assuming that it did get numb. How long was the area numb? . % benefit, longer than 6 hours. How long? .. 3. How much better is your pain now, when compared to before the procedure? Current benefit: . % better. 4. Can you move better now? Improvement in ROM (Range of Motion): .. 5. Can you do more now? Improvement in function: .Marland Kitchen 4. Did you have any problems with the procedure? Side-effects/Complications: Marland KitchenMarland Kitchen

## 2019-03-02 NOTE — Telephone Encounter (Signed)
Shelly Hanson please call the patient and let her know I refilled a short supply of her dicyclomine, it has been more than 1 year since she has been in the office therefore she will need a office appointment for further refills thank you

## 2019-03-05 NOTE — Progress Notes (Signed)
Pain Management Virtual Encounter Note - Virtual Visit via Telephone Telehealth (real-time audio visits between healthcare provider and patient).   Patient's Phone No. & Preferred Pharmacy:  [phone] (home); There is no such number on file (mobile).; (Preferred) [phone] No e-mail address on record  [facility] - [place], [place] - [address] [place] [address] Phone: [phone] Fax: [phone]  [facility], [place] - [place], [place] - [address] [place] [address] Phone: [phone] Fax: [phone]  [facility] - [place], [place] - [address] [place] [address] Phone: [phone] Fax: [phone]    Pre-screening note:  Our staff contacted Shelly Hanson and offered her an "in person", "face-to-face" appointment versus a telephone encounter. She indicated preferring the telephone encounter, at this time.   Reason for Virtual Visit: COVID-19*  Social distancing based on CDC and AMA recommendations.   I contacted Shelly Hanson on 03/06/2019 via telephone.      I clearly identified myself as Oswaldo Done, MD. I verified that I was speaking with the correct person using two identifiers (Name: Shelly Hanson, and date of birth: 1960-08-26).  Advanced Informed Consent I sought verbal advanced consent from Shelly Hanson for virtual visit interactions. I informed Shelly Hanson of possible security and privacy concerns, risks, and limitations associated with providing "not-in-person" medical evaluation and management services. I also informed Shelly Hanson of the availability of "in-person" appointments. Finally, I informed her that there would be a charge for the virtual visit and that she could be  personally, fully or partially, financially responsible for it. Shelly Hanson expressed understanding and agreed to proceed.   Historic Elements   Shelly Hanson is a 58 y.o. year old, female patient evaluated today after her last encounter by our practice on Visit date not found. Shelly Hanson  has a past medical history of Abdominal wall pain in right lower quadrant (09/04/2014), GERD (gastroesophageal reflux disease), High cholesterol, Hyperlipemia, IBS (irritable bowel syndrome), Lumbar facet joint syndrome, Migraine headache (12/18/2014), Pinched nerve, Pinched nerve in neck, Sciatic leg pain, and Vitamin D deficiency disease. She also  has a past surgical history that includes Foot surgery; Cholecystectomy; Dilation and curettage of uterus; Colonoscopy (N/A, 11/17/2013); Esophagogastroduodenoscopy (N/A, 11/17/2013); Balloon dilation (N/A, 11/17/2013); maloney dilation (N/A, 11/17/2013); Savory dilation (N/A, 11/17/2013); Carpal tunnel release; and Skin graft. Ms. Archambeault has a current medication list which includes the following prescription(s): docusate sodium, famotidine, fenofibrate, fluticasone, gabapentin, meloxicam, omeprazole, ondansetron, rosuvastatin, sumatriptan, vitamin d (ergocalciferol), and dicyclomine. She  reports that she has been smoking cigarettes. She has a 2.50 pack-year smoking history. She has never used smokeless tobacco. She reports that she does not drink alcohol or use drugs. Shelly Hanson is allergic to amoxicillin; codeine; flagyl [metronidazole]; naproxen; nitrofurantoin; sulfa antibiotics; and sulfur.   HPI  Today, she is being contacted for worsening of previously known (established) problem.  She indicates that she recently had an accident with some Bub wire while she was fixing some fence posts.  Apparently she ended up with some clots and had to go to the hospital.  Although her pain is not directly related to this in the sense that its not in the arms where she had her cuts, her low back pain did worsen.  She describes her low back pain currently to be bilateral with the right side being worse than the left.  She admits that it is  the same type of pain that she had before.  She complains that this pain typically worsens when she stands up for prolonged.  Time or when she is mopping.  Today I have provided her with some information about preemptive analgesia in order to try to prevent some of these episodes.  She did get some benefit from the diagnostic bilateral lumbar facet blocks that we had done in the past and she is interested in perhaps getting some of those repeated.  This patient was last seen on 08/02/2017.  Previously I had prescribed magnesium 500 mg 1 tablet p.o. twice daily; Flexeril 10 mg 1 tablet p.o. 3 times daily; gabapentin 300 mg with instructions to take 1 to 3 tablets, up to 3 times daily; Mobic 15 mg 1 tab p.o. daily.  All of these medications were prescribed to the patient on 07/27/2016.  Recent review of the patient's PMP reveals that the last controlled substance written for this patient was on 09/04/2017 (oxycodone 5 mg #35) [facility].  Today we will be scheduling her to come back for a diagnostic bilateral lumbar facet block and we will also schedule her to return to have some x-rays on flexion and extension of her lower back to see if there are any changes or worsening of her lumbar facet hypertrophy..  Post-Procedure Evaluation  Procedure: Diagnostic bilateral lumbar facet block #2 under fluoroscopic guidance and IV sedation Pre-procedure pain level:  5/10 Post-procedure: 0/10 (100% relief)  Sedation: Sedation provided.  Rickey BarbaraShatley, Jennifer C, RN  03/02/2019  2:34 PM  Sign when Signing Visit Pain relief after procedure (treated area only): (Questions asked to patient) 1. Starting about 15 minutes after the procedure, and "while the area was still numb" (from the local anesthetics), were you having any of your usual pain "in that area"? (NOT including the discomfort from the needle sticks.) First 1 hour: can't remember % better. First 4-6 hours: 80 % better. 2. Assuming that it did get numb.  How long was the area numb? 80 % benefit, longer than 6 hours. How long? 1 month. 3. How much better is your pain now, when compared to before the procedure? Current benefit: 0 % better. 4. Can you move better now? Improvement in ROM (Range of Motion): Yes. 5. Can you do more now? Improvement in function: Yes. 4. Did you have any problems with the procedure? Side-effects/Complications: No.   Current benefits: Defined as benefit that persist at this time.   Analgesia:  Back to baseline Function: Back to baseline ROM: Back to baseline  Pharmacotherapy Assessment  Analgesic: No opioid analgesics prescribed by our practice.  No opioids secondary to unreported THC found on 05/12/2016 UDS.   Monitoring: Pharmacotherapy: No side-effects or adverse reactions reported. [place] PMP: PDMP reviewed during this encounter.       Compliance: No problems identified. Effectiveness: Clinically acceptable. Plan: Refer to "POC".  UDS:  Summary  Date Value Ref Range Status  05/12/2016 FINAL  Final    Comment:    ==================================================================== TOXASSURE COMP DRUG ANALYSIS,UR ==================================================================== Test                             Result       Flag       Units Drug Present and Declared for Prescription Verification   Gabapentin                     PRESENT  EXPECTED Drug Present not Declared for Prescription Verification   Carboxy-THC                    150          UNEXPECTED ng/mg creat    Carboxy-THC is a metabolite of tetrahydrocannabinol  (THC).    Source of [facility] is most commonly illicit, but THC is also present    in a scheduled prescription medication. Drug Absent but Declared for Prescription Verification   Butalbital                     Not Detected UNEXPECTED   Tramadol                       Not Detected UNEXPECTED   Venlafaxine                    Not Detected UNEXPECTED   Acetaminophen                   Not Detected UNEXPECTED    Acetaminophen, as indicated in the declared medication list, is    not always detected even when used as directed. ==================================================================== Test                      Result    Flag   Units      Ref Range   Creatinine              141              mg/dL      >=20 ==================================================================== Declared Medications:  The flagging and interpretation on this report are based on the  following declared medications.  Unexpected results may arise from  inaccuracies in the declared medications.  **Note: The testing scope of this panel includes these medications:  Butalbital (Butalbital/APAP/Caffeine)  Gabapentin  Tramadol  Venlafaxine  **Note: The testing scope of this panel does not include small to  moderate amounts of these reported medications:  Acetaminophen (Butalbital/APAP/Caffeine)  **Note: The testing scope of this panel does not include following  reported medications:  Caffeine (Butalbital/APAP/Caffeine)  Dicyclomine  Docusate  Estradiol  Fluticasone  Medroxyprogesterone  Meloxicam  Omega-3 Fatty Acids  Omeprazole  Ondansetron  Ranitidine  Rosuvastatin  Vitamin D2 (Ergocalciferol) ==================================================================== For clinical consultation, please call [phone]. ====================================================================    Laboratory Chemistry Profile (12 mo)  Renal: No results found for requested labs within last 8760 hours.  Lab Results  Component Value Date   GFRAA >60 06/16/2017   GFRNONAA >60 06/16/2017   Hepatic: No results found for requested labs within last 8760 hours. Lab Results  Component Value Date   AST 19 05/12/2016   ALT 16 05/12/2016   Other: No results found for requested labs within last 8760 hours. Note: Above Lab results reviewed.  Imaging  Last 90 days:  Mm 3d Screen Breast  Bilateral  Result Date: 03/02/2019 CLINICAL DATA:  Screening. EXAM: DIGITAL SCREENING BILATERAL MAMMOGRAM WITH TOMO AND CAD COMPARISON:  Previous exam(s). ACR Breast Density Category b: There are scattered areas of fibroglandular density. FINDINGS: There are no findings suspicious for malignancy. Images were processed with CAD. IMPRESSION: No mammographic evidence of malignancy. A result letter of this screening mammogram will be mailed directly to the patient. RECOMMENDATION: Screening mammogram in one year. (Code:SM-B-01Y) BI-RADS CATEGORY  1: Negative. Electronically Signed   By: Georgiana Shore.D.  On: 03/02/2019 14:50    Assessment  The primary encounter diagnosis was Lumbar facet syndrome (Bilateral) (R>L). Diagnoses of Chronic low back pain (Primary Area of Pain) (Midline) (Bilateral) (R>L), Lumbar facet hypertrophy (multilevel), Spondylosis without myelopathy or radiculopathy, lumbosacral region, and Chronic pain syndrome were also pertinent to this visit.  Plan of Care  I have discontinued Shelly Hanson's ranitidine, Aspirin-Acetaminophen-Caffeine (EXCEDRIN MIGRAINE PO), medroxyPROGESTERone, estradiol, and progesterone. I am also having her maintain her rosuvastatin, omeprazole, Vitamin D (Ergocalciferol), fluticasone, docusate sodium, gabapentin, meloxicam, ondansetron, SUMAtriptan, famotidine, and FENOFIBRATE PO.  Pharmacotherapy (Medications Ordered): No orders of the defined types were placed in this encounter.  Orders:  Orders Placed This Encounter  Procedures  . L-FCT Blk (Schedule)    Standing Status:   Future    Standing Expiration Date:   04/05/2019    Scheduling Instructions:     Procedure: Lumbar facet block (AKA.: Lumbosacral medial branch nerve block)     Side: Bilateral     Level: L3-4, L4-5, & L5-S1 Facets (L2, L3, L4, L5, & S1 Medial Branch Nerves)     Sedation: Patient's choice.     Timeframe: ASAA    Order Specific Question:   Where will this procedure be  performed?    Answer:   [facility]  . DG Lumbar Spine Complete w/ Flex/Ext (6 Views)    In addition to any acute findings, please report on degenerative changes related to: (Please specify level(s)) (1) ROM & instability (>66mm displacement) (2) Facet joint (Zygoapophyseal Joint) (3) DDD and/or IVDD (4) Pars defects (5) Previous surgical changes (Include description of hardware and hardware status, if present) (6) Presence and degree of spondylolisthesis, spondylosis, and/or spondyloarthropathies)  (7) Old Fractures (8) Demineralization (9) Additional bone pathology (10) Stenosis (Central, Lateral Recess, Foraminal) (11) If at all possible, please provide AP diameter (mm) of foraminal and/or central canal.    Standing Status:   Future    Standing Expiration Date:   06/06/2019    Order Specific Question:   Reason for Exam (SYMPTOM  OR DIAGNOSIS REQUIRED)    Answer:   Low back pain    Order Specific Question:   Is patient pregnant?    Answer:   No    Order Specific Question:   Preferred imaging location?    Answer:    [facility]    Order Specific Question:   Call Results- Best Contact Number?    Answer:   [phone] [facility])    Order Specific Question:   Radiology Contrast Protocol - do NOT remove file path    Answer:   [email or web address]   Follow-up plan:   Return for Procedure (w/ sedation): (B) L-FCT BLK #3.      Interventional management options: Planned, scheduled, and/or pending:   Physical therapy trial for the patient's low back pain.  If the patient fails this trial, then we will proceed with radiofrequency of the lumbar facets.  Meanwhile, if the patient has a flareup of her low back pain we will bring her back for a palliative bilateral lumbar facet block under fluoroscopic guidance and IV sedation   Considering:   Diagnosticbilateral-sided lumbar facet block#3  Possiblebilateral-sided lumbar facet  RFA DiagnosticRight L4-5 lumbar epiduralsteroid injection  DiagnosticRight cervical epidural steroid injection   Palliative PRN treatment(s):   Palliative bilateral lumbar facet block #3 under fluoroscopic guidance and IV sedation    Recent Visits No visits were found meeting these conditions.  Showing recent visits within past 90 days and  meeting all other requirements   Today's Visits Date Type Provider Dept  03/06/19 Telemedicine Delano Metz, MD [facility]  Showing today's visits and meeting all other requirements   Future Appointments No visits were found meeting these conditions.  Showing future appointments within next 90 days and meeting all other requirements   I discussed the assessment and treatment plan with the patient. The patient was provided an opportunity to ask questions and all were answered. The patient agreed with the plan and demonstrated an understanding of the instructions.  Patient advised to call back or seek an in-person evaluation if the symptoms or condition worsens.  Total duration of non-face-to-face encounter: 15 minutes.  Note by: Oswaldo Done, MD Date: 03/06/2019; Time: 11:48 AM  Note: This dictation was prepared with Dragon dictation. Any transcriptional errors that may result from this process are unintentional.  Disclaimer:  * Given the special circumstances of the COVID-19 pandemic, the federal government has announced that the Office for Civil Rights (OCR) will exercise its enforcement discretion and will not impose penalties on physicians using telehealth in the event of noncompliance with regulatory requirements under the [employer] Portability and Accountability Act (HIPAA) in connection with the good faith provision of telehealth during the COVID-19 national public health emergency. (AMA)

## 2019-03-06 ENCOUNTER — Ambulatory Visit: Payer: Medicare HMO | Attending: Pain Medicine | Admitting: Pain Medicine

## 2019-03-06 ENCOUNTER — Other Ambulatory Visit: Payer: Self-pay

## 2019-03-06 DIAGNOSIS — M5442 Lumbago with sciatica, left side: Secondary | ICD-10-CM | POA: Diagnosis not present

## 2019-03-06 DIAGNOSIS — M47816 Spondylosis without myelopathy or radiculopathy, lumbar region: Secondary | ICD-10-CM

## 2019-03-06 DIAGNOSIS — M47817 Spondylosis without myelopathy or radiculopathy, lumbosacral region: Secondary | ICD-10-CM

## 2019-03-06 DIAGNOSIS — G894 Chronic pain syndrome: Secondary | ICD-10-CM | POA: Diagnosis not present

## 2019-03-06 DIAGNOSIS — G8929 Other chronic pain: Secondary | ICD-10-CM

## 2019-03-06 DIAGNOSIS — M5441 Lumbago with sciatica, right side: Secondary | ICD-10-CM

## 2019-03-06 NOTE — Patient Instructions (Signed)
____________________________________________________________________________________________  Pain Prevention Technique  Definition:   A technique used to minimize the effects of an activity known to cause inflammation or swelling, which in turn leads to an increase in pain.  Purpose: To prevent swelling from occurring. It is based on the fact that it is easier to prevent swelling from happening than it is to get rid of it, once it occurs.  Contraindications: 1. Anyone with allergy or hypersensitivity to the recommended medications. 2. Anyone taking anticoagulants (Blood Thinners) (e.g., Coumadin, Warfarin, Plavix, etc.). 3. Patients in Renal Failure.  Technique: Before you undertake an activity known to cause pain, or a flare-up of your chronic pain, and before you experience any pain, do the following:  1. On a full stomach, take 4 (four) over the counter Ibuprofens 200mg tablets (Motrin), for a total of 800 mg. 2. In addition, take over the counter Magnesium 400 to 500 mg, before doing the activity.  3. Six (6) hours later, again on a full stomach, repeat the Ibuprofen. 4. That night, take a warm shower and stretch under the running warm water.  This technique may be sufficient to abort the pain and discomfort before it happens. Keep in mind that it takes a lot less medication to prevent swelling than it takes to eliminate it once it occurs.  ____________________________________________________________________________________________   ____________________________________________________________________________________________  Preparing for Procedure with Sedation  Procedure appointments are limited to planned procedures: . No Prescription Refills. . No disability issues will be discussed. . No medication changes will be discussed.  Instructions: . Oral Intake: Do not eat or drink anything for at least 8 hours prior to your procedure. . Transportation: Public transportation  is not allowed. Bring an adult driver. The driver must be physically present in our waiting room before any procedure can be started. . Physical Assistance: Bring an adult physically capable of assisting you, in the event you need help. This adult should keep you company at home for at least 6 hours after the procedure. . Blood Pressure Medicine: Take your blood pressure medicine with a sip of water the morning of the procedure. . Blood thinners: Notify our staff if you are taking any blood thinners. Depending on which one you take, there will be specific instructions on how and when to stop it. . Diabetics on insulin: Notify the staff so that you can be scheduled 1st case in the morning. If your diabetes requires high dose insulin, take only  of your normal insulin dose the morning of the procedure and notify the staff that you have done so. . Preventing infections: Shower with an antibacterial soap the morning of your procedure. . Build-up your immune system: Take 1000 mg of Vitamin C with every meal (3 times a day) the day prior to your procedure. . Antibiotics: Inform the staff if you have a condition or reason that requires you to take antibiotics before dental procedures. . Pregnancy: If you are pregnant, call and cancel the procedure. . Sickness: If you have a cold, fever, or any active infections, call and cancel the procedure. . Arrival: You must be in the facility at least 30 minutes prior to your scheduled procedure. . Children: Do not bring children with you. . Dress appropriately: Bring dark clothing that you would not mind if they get stained. . Valuables: Do not bring any jewelry or valuables.  Reasons to call and reschedule or cancel your procedure: (Following these recommendations will minimize the risk of a serious complication.) . Surgeries: Avoid having   procedures within 2 weeks of any surgery. (Avoid for 2 weeks before or after any surgery). . Flu Shots: Avoid having procedures  within 2 weeks of a flu shots or . (Avoid for 2 weeks before or after immunizations). . Barium: Avoid having a procedure within 7-10 days after having had a radiological study involving the use of radiological contrast. (Myelograms, Barium swallow or enema study). . Heart attacks: Avoid any elective procedures or surgeries for the initial 6 months after a "Myocardial Infarction" (Heart Attack). . Blood thinners: It is imperative that you stop these medications before procedures. Let us know if you if you take any blood thinner.  . Infection: Avoid procedures during or within two weeks of an infection (including chest colds or gastrointestinal problems). Symptoms associated with infections include: Localized redness, fever, chills, night sweats or profuse sweating, burning sensation when voiding, cough, congestion, stuffiness, runny nose, sore throat, diarrhea, nausea, vomiting, cold or Flu symptoms, recent or current infections. It is specially important if the infection is over the area that we intend to treat. . Heart and lung problems: Symptoms that may suggest an active cardiopulmonary problem include: cough, chest pain, breathing difficulties or shortness of breath, dizziness, ankle swelling, uncontrolled high or unusually low blood pressure, and/or palpitations. If you are experiencing any of these symptoms, cancel your procedure and contact your primary care physician for an evaluation.  Remember:  Regular Business hours are:  Monday to Thursday 8:00 AM to 4:00 PM  Provider's Schedule: Jazz Rogala, MD:  Procedure days: Tuesday and Thursday 7:30 AM to 4:00 PM  Bilal Lateef, MD:  Procedure days: Monday and Wednesday 7:30 AM to 4:00 PM ____________________________________________________________________________________________    

## 2019-03-22 ENCOUNTER — Telehealth: Payer: Self-pay | Admitting: *Deleted

## 2019-03-22 ENCOUNTER — Other Ambulatory Visit: Payer: Self-pay | Admitting: *Deleted

## 2019-03-22 NOTE — Telephone Encounter (Signed)
Pt wants to talk to nurse regarding her refill being denied. Please call. Thanks!!

## 2019-03-22 NOTE — Telephone Encounter (Signed)
Left message letting pt know refill was refused and to schedule an appt. Shelly Hanson

## 2019-03-28 ENCOUNTER — Ambulatory Visit: Payer: Medicare HMO | Admitting: Pain Medicine

## 2019-04-10 ENCOUNTER — Other Ambulatory Visit: Payer: Self-pay

## 2019-04-10 ENCOUNTER — Encounter: Payer: Self-pay | Admitting: Obstetrics and Gynecology

## 2019-04-10 ENCOUNTER — Telehealth (INDEPENDENT_AMBULATORY_CARE_PROVIDER_SITE_OTHER): Payer: Medicare HMO | Admitting: Obstetrics and Gynecology

## 2019-04-10 VITALS — Ht 65.0 in

## 2019-04-10 DIAGNOSIS — Z79890 Hormone replacement therapy (postmenopausal): Secondary | ICD-10-CM | POA: Diagnosis not present

## 2019-04-10 NOTE — Progress Notes (Signed)
Patient ID: Shelly Hanson, female   DOB: 12-25-1960, 58 y.o.   MRN: [id number]    TELEHEALTH VIRTUAL GYNECOLOGY VISIT ENCOUNTER NOTE  I connected with Shelly Hanson on 04/10/2019 at 11:30 AM EST by telephone at home and verified that I am speaking with the correct person using two identifiers.   I discussed the limitations, risks, security and privacy concerns of performing an evaluation and management service by telephone and the availability of in person appointments. I also discussed with the patient that there may be a patient responsible charge related to this service. The patient expressed understanding and agreed to proceed.   History:  Shelly Hanson is a 58 y.o. G21P0020 female being evaluated today for discussion of medication and hot flashes. She denies any abnormal vaginal discharge, bleeding, pelvic pain or other concerns.    She was tried on estrogen, Vivelle-Dot plus Prometrium, and discontinued these due to side effects.  She has some vasomotor hot flashes.  She lives with younger female partner, who seeks sexual activity several times daily (2-3)  She is not interested in reattempting hormone therapy Past Medical History:  Diagnosis Date  . Abdominal wall pain in right lower quadrant 09/04/2014  . GERD (gastroesophageal reflux disease)   . High cholesterol   . Hyperlipemia   . IBS (irritable bowel syndrome)   . Lumbar facet joint syndrome   . Migraine headache 12/18/2014  . Pinched nerve    pinched nerve in back   . Pinched nerve in neck   . Sciatic leg pain   . Vitamin D deficiency disease    Past Surgical History:  Procedure Laterality Date  . BALLOON DILATION N/A 11/17/2013   Procedure: BALLOON DILATION;  Surgeon: Malissa Hippo, MD;  [facility];  Service: Endoscopy;  Laterality: N/A;  . CARPAL TUNNEL RELEASE    . CHOLECYSTECTOMY    . COLONOSCOPY N/A 11/17/2013   Procedure: COLONOSCOPY;  Surgeon: Malissa Hippo, MD;  [facility];  Service: Endoscopy;  Laterality: N/A;  1030  . DILATION AND CURETTAGE OF UTERUS     x 2   . ESOPHAGOGASTRODUODENOSCOPY N/A 11/17/2013   Procedure: ESOPHAGOGASTRODUODENOSCOPY (EGD);  Surgeon: Malissa Hippo, MD;  [facility];  Service: Endoscopy;  Laterality: N/A;  . FOOT SURGERY     for a fx.   Marland Kitchen [name] N/A 11/17/2013   Procedure: [name] DILATION;  Surgeon: Malissa Hippo, MD;  [facility];  Service: Endoscopy;  Laterality: N/A;  . SAVORY DILATION N/A 11/17/2013   Procedure: SAVORY DILATION;  Surgeon: Malissa Hippo, MD;  [facility];  Service: Endoscopy;  Laterality: N/A;  . SKIN GRAFT     The following portions of the patient's history were reviewed and updated as appropriate: allergies, current medications, past family history, past medical history, past social history, past surgical history and problem list.   Review of Systems:  Pertinent items noted in HPI and remainder of comprehensive ROS otherwise negative.  Physical Exam:   General:  Alert, oriented and cooperative.   Mental Status: Normal mood and affect perceived. Normal judgment and thought content.  Physical exam deferred due to nature of the encounter  Labs and Imaging No results found for this or any previous visit (from the past 336 hour(s)). No results found.    Assessment and Plan:     Vasomotor symptoms of menopause, adequately tolerated      I discussed the assessment  and treatment plan with the patient. The patient was provided an opportunity to ask questions and all were answered. The patient agreed with the plan and demonstrated an understanding of the instructions.   The patient was advised to call back or seek an in-person evaluation/go to the ED if the symptoms worsen or if the condition fails to improve as anticipated.  I provided 10 minutes of non-face-to-face time during this encounter.   By signing my name below, I, Samul Dada, attest that  this documentation has been prepared under the direction and in the presence of Jonnie Kind, MD. Electronically Signed: [place]. 04/10/19. 12:12 PM.  I personally performed the services described in this documentation, which was SCRIBED in my presence. The recorded information has been reviewed and considered accurate. It has been edited as necessary during review. Jonnie Kind, MD

## 2019-04-11 ENCOUNTER — Telehealth: Payer: Self-pay | Admitting: *Deleted

## 2019-04-11 NOTE — Telephone Encounter (Signed)
When we completed the conversation at the [facility], I was left with the understanding that Shelly Hanson was tolerating the hot flashes well. And had not tolerated prior efforts at hormone treatments.  So unless she and I need to revisit that issue, I didn't intend to send in any more prescriptions for hot flash treatments.

## 2019-04-11 NOTE — Telephone Encounter (Signed)
Patient wants to know if Dr. Glo Herring is going to send in a prescription for her hot flashes.

## 2019-04-28 ENCOUNTER — Encounter (HOSPITAL_COMMUNITY): Payer: Self-pay

## 2019-04-28 ENCOUNTER — Emergency Department (HOSPITAL_COMMUNITY): Payer: Medicare HMO

## 2019-04-28 ENCOUNTER — Emergency Department (HOSPITAL_COMMUNITY)
Admission: EM | Admit: 2019-04-28 | Discharge: 2019-04-28 | Disposition: A | Discharge status: Home / Self Care | Payer: Medicare HMO | Attending: Emergency Medicine | Admitting: Emergency Medicine

## 2019-04-28 ENCOUNTER — Other Ambulatory Visit: Payer: Self-pay

## 2019-04-28 DIAGNOSIS — Y939 Activity, unspecified: Secondary | ICD-10-CM | POA: Insufficient documentation

## 2019-04-28 DIAGNOSIS — S52602A Unspecified fracture of lower end of left ulna, initial encounter for closed fracture: Secondary | ICD-10-CM | POA: Diagnosis not present

## 2019-04-28 DIAGNOSIS — Y999 Unspecified external cause status: Secondary | ICD-10-CM | POA: Diagnosis not present

## 2019-04-28 DIAGNOSIS — S52502A Unspecified fracture of the lower end of left radius, initial encounter for closed fracture: Secondary | ICD-10-CM | POA: Insufficient documentation

## 2019-04-28 DIAGNOSIS — F17210 Nicotine dependence, cigarettes, uncomplicated: Secondary | ICD-10-CM | POA: Insufficient documentation

## 2019-04-28 DIAGNOSIS — S59912A Unspecified injury of left forearm, initial encounter: Secondary | ICD-10-CM | POA: Diagnosis present

## 2019-04-28 DIAGNOSIS — Z79899 Other long term (current) drug therapy: Secondary | ICD-10-CM | POA: Diagnosis not present

## 2019-04-28 DIAGNOSIS — Y929 Unspecified place or not applicable: Secondary | ICD-10-CM | POA: Insufficient documentation

## 2019-04-28 DIAGNOSIS — W109XXA Fall (on) (from) unspecified stairs and steps, initial encounter: Secondary | ICD-10-CM | POA: Diagnosis not present

## 2019-04-28 MED ORDER — TRAMADOL HCL 50 MG PO TABS
50.0000 mg | ORAL_TABLET | Freq: Once | ORAL | Status: AC
  Administered 2019-04-28: 50 mg via ORAL
  Filled 2019-04-28: qty 1

## 2019-04-28 NOTE — ED Triage Notes (Signed)
Pt reports falling and injuring left wrist, pt says she has fractured same wrist about a year ago and was seen at [facility]. Pt says she is unable to flex and extend wrist and is "pretty sure it is broken". Pt says she is "on pain pills for her back", but hasn't taken anything extra. Pt is able to move fingers, cap refill WNL. Pt has forearm/wrist wrapped with ace bandage.

## 2019-04-28 NOTE — ED Notes (Signed)
Patient transported to X-ray 

## 2019-04-28 NOTE — Discharge Instructions (Addendum)
Keep your left arm splinted.  Elevate it when possible.  Call one of the orthopedic providers listed to arrange a follow-up appointment for next week.

## 2019-04-28 NOTE — ED Provider Notes (Signed)
[facility] EMERGENCY DEPARTMENT Provider Note   CSN: [id number] Arrival date & time: 04/28/19  2048     History Chief Complaint  Patient presents with  . Fall    left wrist pain    Shelly Hanson is a 59 y.o. female.  HPI      Shelly Hanson is a 59 y.o. female who presents to the Emergency Department complaining of pain and swelling of her left wrist and pain of her left elbow.  She describes a mechanical fall that occurred earlier this evening in which she slipped on a wet step falling and landing on her left arm.  She reports a history of a previous fracture of the same wrist 1 year ago.  She describes a tingling sensation to her fingertips.  She denies chest pain, head injury, LOC, dizziness or pain to the left shoulder.    Past Medical History:  Diagnosis Date  . Abdominal wall pain in right lower quadrant 09/04/2014  . GERD (gastroesophageal reflux disease)   . High cholesterol   . Hyperlipemia   . IBS (irritable bowel syndrome)   . Lumbar facet joint syndrome   . Migraine headache 12/18/2014  . Pinched nerve    pinched nerve in back   . Pinched nerve in neck   . Sciatic leg pain   . Vitamin D deficiency disease     Patient Active Problem List   Diagnosis Date Noted  . Screening for colorectal cancer 06/02/2018  . Routine cervical smear 06/02/2018  . Encounter for gynecological examination with Papanicolaou smear of cervix 06/02/2018  . Postmenopausal HRT (hormone replacement therapy) 06/02/2018  . Trigger thumb, left thumb 02/07/2018  . Closed fracture of distal end of left ulna with routine healing 09/22/2017  . Arm wound, right, sequela 09/08/2017  . Soft tissue infection 08/31/2017  . Spondylosis without myelopathy or radiculopathy, lumbosacral region 07/12/2017  . BV (bacterial vaginosis) 04/27/2017  . Vaginal discharge 04/27/2017  . Chronic RLQ pain 04/27/2017  . Hematuria 04/27/2017  . Abnormal urine odor 04/27/2017  . Chronic headaches  12/23/2016  . Vasomotor symptoms due to menopause 12/23/2016  . Neurogenic pain 07/27/2016  . Osteoarthritis, multiple joints 07/27/2016  . Musculoskeletal pain 07/27/2016  . Marijuana use 05/26/2016  . Chronic lumbar radicular pain (Right) (L5) 05/12/2016  . Chronic pain syndrome 05/11/2016  . Chronic tension-type headache, not intractable 03/31/2016  . Long term current use of opiate analgesic 12/19/2015  . Long term prescription opiate use 12/19/2015  . Opiate use 12/19/2015  . Encounter for therapeutic drug level monitoring 12/19/2015  . Encounter for pain management planning 12/19/2015  . Chronic low back pain (Primary Area of Pain) (Midline) (Bilateral) (R>L) 12/19/2015  . Chronic lower extremity pain (Secondary area of Pain) (Right) 12/19/2015  . Chronic neck pain (Third area of Pain) (Right) 12/19/2015  . Chronic upper extremity pain (Bilateral) (R>L) 12/19/2015  . Diverticulosis of colon 12/19/2015  . Anterolisthesis (L3 over L4 and L4 over L5) 12/19/2015  . Lumbar facet hypertrophy (multilevel) 12/19/2015  . Lumbar facet syndrome (Bilateral) (R>L) 12/19/2015  . Lumbar foraminal stenosis (L4) (Left) 12/19/2015  . Perimenopausal vasomotor symptoms 07/22/2015  . Dysphagia, unspecified(787.20) 10/12/2013  . High cholesterol 10/12/2013    Past Surgical History:  Procedure Laterality Date  . BALLOON DILATION N/A 11/17/2013   Procedure: BALLOON DILATION;  Surgeon: Malissa Hippo, MD;  [facility];  Service: Endoscopy;  Laterality: N/A;  . CARPAL TUNNEL RELEASE    . CHOLECYSTECTOMY    .  COLONOSCOPY N/A 11/17/2013   Procedure: COLONOSCOPY;  Surgeon: Malissa Hippo, MD;  [facility];  Service: Endoscopy;  Laterality: N/A;  1030  . DILATION AND CURETTAGE OF UTERUS     x 2   . ESOPHAGOGASTRODUODENOSCOPY N/A 11/17/2013   Procedure: ESOPHAGOGASTRODUODENOSCOPY (EGD);  Surgeon: Malissa Hippo, MD;  [facility];  Service: Endoscopy;  Laterality:  N/A;  . FOOT SURGERY     for a fx.   Marland Kitchen [name] N/A 11/17/2013   Procedure: [name] DILATION;  Surgeon: Malissa Hippo, MD;  [facility];  Service: Endoscopy;  Laterality: N/A;  . SAVORY DILATION N/A 11/17/2013   Procedure: SAVORY DILATION;  Surgeon: Malissa Hippo, MD;  [facility];  Service: Endoscopy;  Laterality: N/A;  . SKIN GRAFT       OB History    Gravida  2   Para      Term      Preterm      AB  2   Living        SAB  2   TAB      Ectopic      Multiple      Live Births              Family History  Problem Relation Age of Onset  . Colon cancer Father   . Hypertension Father   . Seizures Father   . Cancer Father   . Depression Mother   . Diabetes Mother   . Hyperlipidemia Mother   . Hyperlipidemia Brother   . Heart disease Brother   . Hyperlipidemia Brother     Social History   Tobacco Use  . Smoking status: Current Every Day Smoker    Packs/day: 0.25    Years: 10.00    Pack years: 2.50    Types: Cigarettes  . Smokeless tobacco: Never Used  . Tobacco comment: 1 pack every 3 days greater than 10 yrs  Substance Use Topics  . Alcohol use: No  . Drug use: No    Home Medications Prior to Admission medications   Medication Sig Start Date End Date Taking? Authorizing Provider  dicyclomine (BENTYL) 10 MG capsule Take 10 mg by mouth 4 (four) times daily.    [provider]  docusate sodium (COLACE) 100 MG capsule Take 100 mg by mouth daily.    [provider]  famotidine (PEPCID) 20 MG tablet Take 20 mg by mouth daily.    [provider]  FENOFIBRATE PO Take 48 mg by mouth 1 day or 1 dose.    [provider]  gabapentin (NEURONTIN) 100 MG capsule Take 200 mg by mouth 3 (three) times daily.     [provider]  meloxicam (MOBIC) 15 MG tablet Take 15 mg by mouth daily.    [provider]  omeprazole (PRILOSEC) 40 MG capsule Take 40 mg by mouth daily.     [provider]  ondansetron (ZOFRAN-ODT) 8 MG disintegrating tablet DISSOLVE 1 TABLET BY MOUTH ONCE EVERY 8 HOURS AS NEEDED FOR NAUSEA & VOMITING 11/25/18   Tilda Burrow, MD  SUMAtriptan (IMITREX) 100 MG tablet Take 100 mg by mouth every 2 (two) hours as needed for migraine. May repeat in 2 hours if headache persists or recurs.    [provider]  Vitamin D, Ergocalciferol, (DRISDOL) 50000 UNITS CAPS capsule Take 50,000 Units by mouth every 7 (seven) days. Takes on Saturdays.  [provider]    Allergies    Amoxicillin, Codeine, Flagyl [metronidazole], Naproxen, Nitrofurantoin, Sulfa antibiotics, and Sulfur  Review of Systems   Review of Systems  Constitutional: Negative for chills and fever.  Cardiovascular: Negative for chest pain.  Musculoskeletal: Positive for arthralgias (left wrist and elbow pain) and joint swelling. Negative for neck pain.  Skin: Negative for color change and wound.  Neurological: Negative for dizziness, syncope, numbness and headaches.    Physical Exam Updated Vital Signs BP 135/72 (BP Location: Right Arm)   Pulse 86   Temp (!) 96.6 F (35.9 C) (Tympanic)   Resp 18   Ht 5\' 5"  (1.651 m)   Wt 99.8 kg   SpO2 96%   BMI 36.61 kg/m   Physical Exam Vitals and nursing note reviewed.  Constitutional:      Appearance: Normal appearance.  HENT:     Head: Atraumatic.  Cardiovascular:     Rate and Rhythm: Normal rate and regular rhythm.     Pulses: Normal pulses.  Pulmonary:     Effort: Pulmonary effort is normal.     Breath sounds: Normal breath sounds.  Chest:     Chest wall: No tenderness.  Musculoskeletal:        General: Swelling, tenderness and signs of injury present.     Cervical back: Normal range of motion. No tenderness.     Comments: ttp of the distal left wrist and distal left elbow.  Mild edema noted to the wrist.  Left shoulder non-tender.  Patient is guarding her wrist.  Compartments are soft.   Skin:     General: Skin is warm.     Capillary Refill: Capillary refill takes less than 2 seconds.     Findings: No erythema.  Neurological:     General: No focal deficit present.     Mental Status: She is alert.     Sensory: No sensory deficit.     Motor: No weakness.     ED Results / Procedures / Treatments   Labs (all labs ordered are listed, but only abnormal results are displayed) Labs Reviewed - No data to display  EKG None  Radiology DG Elbow Complete Left  Result Date: 04/28/2019 CLINICAL DATA:  Recent fall with left elbow pain, initial encounter EXAM: LEFT ELBOW - COMPLETE 3+ VIEW COMPARISON:  None. FINDINGS: There is no evidence of fracture, dislocation, or joint effusion. There is no evidence of arthropathy or other focal bone abnormality. Soft tissues are unremarkable. IMPRESSION: No acute abnormality noted. Electronically Signed   By: 06/26/2019 M.D.   On: 04/28/2019 22:58   DG Wrist Complete Left  Result Date: 04/28/2019 CLINICAL DATA:  Recent fall with wrist pain, initial encounter EXAM: LEFT WRIST - COMPLETE 3+ VIEW COMPARISON:  06/16/2017 FINDINGS: Comminuted fracture of the distal left radius is noted with impaction and posterior angulation at the fracture site. Ulnar styloid fracture is noted as well. Healed distal ulnar shaft fracture is noted. No carpal abnormality is seen. Soft tissue swelling is noted. Intra-articular involvement of the radial fracture is seen. IMPRESSION: Distal radial and ulnar fractures with intra-articular involvement of the radial fracture. Healed distal ulnar shaft fracture when compared with the prior exam. Electronically Signed   By: 06/18/2017 M.D.   On: 04/28/2019 22:56    Procedures Procedures (including critical care time)  SPLINT APPLICATION Date/Time: 11:34 PM Authorized by: Aristea Posada Consent: Verbal consent obtained. Risks and benefits: risks, benefits and alternatives were discussed Consent given  by: patient Splint applied by:  nursing Location details: sugar tong splint Splint type: sugar tong Supplies used: orthoglass, ACE wrap Post-procedure: The splinted body part was neurovascularly unchanged following the procedure. Patient tolerance: Patient tolerated the procedure well with no immediate complications.     Medications Ordered in ED Medications  traMADol (ULTRAM) tablet 50 mg (50 mg Oral Given 04/28/19 2348)    ED Course  I have reviewed the triage vital signs and the nursing notes.  Pertinent labs & imaging results that were available during my care of the patient were reviewed by me and considered in my medical decision making (see chart for details).    MDM Rules/Calculators/A&P                      Pt with mechanical fall earlier this evening.  Fell on left arm.  NV intact.  Compartments are soft.  XR show fractures of the distal radius and ulna.    Pt also seen by Dr. Gilford Raid. Pt prefers f/u with local orthopedics.  Sling ordered, pt declined, stating that she has one at home. Pt requesting pain medication here, but did not want prescription and declined anything stronger than ultram.      Final Clinical Impression(s) / ED Diagnoses Final diagnoses:  Closed fracture of distal ends of left radius and ulna, initial encounter    Rx / DC Orders ED Discharge Orders    None       Kem Parkinson, PA-C 04/28/19 2351    Isla Pence, MD 04/29/19 1550

## 2019-05-01 ENCOUNTER — Telehealth: Payer: Self-pay | Admitting: Orthopedic Surgery

## 2019-05-01 NOTE — Telephone Encounter (Signed)
I called back to patient regarding scheduling work-in appointment; reached her voice mail; left message.

## 2019-05-01 NOTE — Telephone Encounter (Signed)
Would you review patient's notes and xray report and advise where to schedule the patient?  Thank you so much

## 2019-05-02 ENCOUNTER — Ambulatory Visit: Payer: Medicare HMO | Admitting: Orthopedic Surgery

## 2019-05-02 NOTE — Telephone Encounter (Signed)
We have called patient; she will go to [facility] for appointment today, [facility]; therefore, cancelling today's appointment here.

## 2019-05-02 NOTE — Telephone Encounter (Signed)
Call her back and tell her I looked at the xrays and I m going to refer her to a hand wrist specialist in [place] for surgery   She can come to [place] to get the referral or go to the orthopedist that was recommended in the ER

## 2019-05-15 ENCOUNTER — Encounter (INDEPENDENT_AMBULATORY_CARE_PROVIDER_SITE_OTHER): Payer: Self-pay

## 2019-05-23 ENCOUNTER — Other Ambulatory Visit: Payer: Self-pay

## 2019-05-23 ENCOUNTER — Encounter (INDEPENDENT_AMBULATORY_CARE_PROVIDER_SITE_OTHER): Payer: Self-pay | Admitting: Internal Medicine

## 2019-05-23 ENCOUNTER — Ambulatory Visit (INDEPENDENT_AMBULATORY_CARE_PROVIDER_SITE_OTHER): Payer: Medicare HMO | Admitting: Internal Medicine

## 2019-05-23 DIAGNOSIS — K219 Gastro-esophageal reflux disease without esophagitis: Secondary | ICD-10-CM | POA: Insufficient documentation

## 2019-05-23 DIAGNOSIS — K588 Other irritable bowel syndrome: Secondary | ICD-10-CM

## 2019-05-23 DIAGNOSIS — K589 Irritable bowel syndrome without diarrhea: Secondary | ICD-10-CM | POA: Insufficient documentation

## 2019-05-23 MED ORDER — DICYCLOMINE HCL 10 MG PO CAPS: 10.0000 mg | ORAL_CAPSULE | Freq: Three times a day (TID) | ORAL | 5 refills | Status: DC

## 2019-05-23 MED ORDER — DICYCLOMINE HCL 10 MG PO CAPS: 10.0000 mg | ORAL_CAPSULE | Freq: Three times a day (TID) | ORAL | 1 refills | Status: DC

## 2019-05-23 MED ORDER — OMEPRAZOLE 40 MG PO CPDR: 40.0000 mg | DELAYED_RELEASE_CAPSULE | Freq: Every day | ORAL | 1 refills | Status: DC

## 2019-05-23 MED ORDER — OMEPRAZOLE 40 MG PO CPDR: 40.0000 mg | DELAYED_RELEASE_CAPSULE | Freq: Every day | ORAL | 5 refills | Status: DC

## 2019-05-23 NOTE — Progress Notes (Signed)
Presenting complaint;  Follow-up for IBS and GERD.  Database and subjective:  Patient is 59 year old Caucasian female who has history of right-sided abdominal pain bowel urgency felt to be due to IBS who has done well with dicyclomine.  She is taking this medication 4 times a day.  She does experience dry mouth at night.  She feels heartburn is well controlled with therapy.  She denies melena or rectal bleeding.  She may have occasional hard stool.  She has history of colonic adenomas and family history of CRC and is due for colonoscopy this year.  Last exam was in July 2015. She has gained 7 pounds since her last visit of October 2019.  She says she is trying to lose weight and she has stopped eating fatty or fried foods.  She tries to grilled most of her foods.  Current Medications: Outpatient Encounter Medications as of 05/23/2019  Medication Sig  . dicyclomine (BENTYL) 10 MG capsule Take 10 mg by mouth 4 (four) times daily.  Marland Kitchen docusate sodium (COLACE) 100 MG capsule Take 100 mg by mouth daily.  . famotidine (PEPCID) 20 MG tablet Take 20 mg by mouth daily.  . FENOFIBRATE PO Take 48 mg by mouth daily.   Marland Kitchen gabapentin (NEURONTIN) 100 MG capsule Take 200 mg by mouth 3 (three) times daily.   . meloxicam (MOBIC) 15 MG tablet Take 15 mg by mouth daily.  Marland Kitchen omeprazole (PRILOSEC) 40 MG capsule Take 40 mg by mouth daily.  . ondansetron (ZOFRAN-ODT) 8 MG disintegrating tablet DISSOLVE 1 TABLET BY MOUTH ONCE EVERY 8 HOURS AS NEEDED FOR NAUSEA & VOMITING  . rosuvastatin (CRESTOR) 20 MG tablet Take 20 mg by mouth daily.  . SUMAtriptan (IMITREX) 100 MG tablet Take 100 mg by mouth every 2 (two) hours as needed for migraine. May repeat in 2 hours if headache persists or recurs.  . Vitamin D, Ergocalciferol, (DRISDOL) 50000 UNITS CAPS capsule Take 50,000 Units by mouth every 7 (seven) days. Takes on Saturdays.   No facility-administered encounter medications on file as of 05/23/2019.     Objective: Blood  pressure 118/81, pulse 85, temperature (!) 97.3 F (36.3 C), temperature source Temporal, height 5' 5.5" (1.664 m), weight 217 lb 9.6 oz (98.7 kg). Patient is alert and in no acute distress. She is wearing a facial mask. Conjunctiva is pink. Sclera is nonicteric Oropharyngeal mucosa is normal. No neck masses or thyromegaly noted. Cardiac exam with regular rhythm normal S1 and S2. No murmur or gallop noted. Lungs are clear to auscultation. Abdomen is full but soft and nontender with organomegaly or masses. No LE edema or clubbing noted.  Assessment:  #1.  Irritable bowel syndrome.  Her symptom complex included right-sided abdominal pain and urgency.  She is doing well with dicyclomine at daily dose of 40 mg/day.  She is taking 10 mg 4 times a day.  I feel dose should be reduced as she is having severe dry mouth in the nights.  #2.  Chronic GERD.  She is doing well with therapy.  #3.  History of colonic adenomas and family history of CRC.  Plan:  Patient advised to take dicyclomine 10 mg before each meal.  Patient advised not to take bedtime dose.  New prescription sent to [facility] pharmacy for 90 days with 1 refill. Prescription for omeprazole 40 mg by mouth daily before breakfast sent to to [facility] pharmacy for 90 days with 1 refill. Colonoscopy would be scheduled when Covid-19 pandemic contained. Patient will call office  in 6 months for meds to be renewed. Office visit in 1 year.

## 2019-05-23 NOTE — Patient Instructions (Signed)
Take omeprazole 30 minutes before breakfast daily. Take famotidine at bedtime or in the evening on as-needed basis.

## 2019-08-29 ENCOUNTER — Other Ambulatory Visit: Payer: Self-pay | Admitting: Obstetrics and Gynecology

## 2019-09-03 NOTE — Progress Notes (Signed)
Patient: Shelly Hanson  Service Category: E/M  Provider: Gaspar Cola, MD  DOB: [birth year]  DOS: 09/04/2019  Location: Office  MRN: [id number]  Setting: Ambulatory outpatient  Referring Provider: Renee Rival, NP  Type: Established Patient  Specialty: Interventional Pain Management  PCP: Shelly Rival, NP  Location: Remote location  Delivery: TeleHealth     Virtual Encounter - Pain Management PROVIDER NOTE: Information contained herein reflects review and annotations entered in association with encounter. Interpretation of such information and data should be left to medically-trained personnel. Information provided to patient can be located elsewhere in the medical record under "Patient Instructions". Document created using STT-dictation technology, any transcriptional errors that may result from process are unintentional.    Contact & Pharmacy Preferred: [phone] Home: [phone] (home) Mobile: There is no such number on file (mobile). E-mail: No e-mail address on record  [place], [place] - [address] [place] [address] Phone: [phone] Fax: [phone]   Pre-screening  Shelly Hanson offered "in-person" vs "virtual" encounter. She indicated preferring virtual for this encounter.   Reason COVID-19*  Social distancing based on CDC and AMA recommendations.   I contacted Shelly Hanson on 09/04/2019 via telephone.      I clearly identified myself as Shelly Cola, MD. I verified that I was speaking with the correct person using two identifiers (Name: Shelly Hanson, and date of birth: 08/17/1960).  Consent I sought verbal advanced consent from Shelly Hanson for virtual visit interactions. I informed Shelly Hanson of possible security and privacy concerns, risks, and limitations associated with providing "not-in-person" medical evaluation and management services. I also informed Shelly Hanson of  the availability of "in-person" appointments. Finally, I informed her that there would be a charge for the virtual visit and that she could be  personally, fully or partially, financially responsible for it. Shelly Hanson expressed understanding and agreed to proceed.   Historic Elements   Shelly Hanson is a 59 y.o. year old, female patient evaluated today after her last contact with our practice on Visit date not found. Shelly Hanson  has a past medical history of Abdominal wall pain in right lower quadrant (09/04/2014), GERD (gastroesophageal reflux disease), High cholesterol, Hyperlipemia, IBS (irritable bowel syndrome), Lumbar facet joint syndrome, Migraine headache (12/18/2014), Pinched nerve, Pinched nerve in neck, Sciatic leg pain, and Vitamin D deficiency disease. She also  has a past surgical history that includes Foot surgery; Cholecystectomy; Dilation and curettage of uterus; Colonoscopy (N/A, 11/17/2013); Esophagogastroduodenoscopy (N/A, 11/17/2013); Balloon dilation (N/A, 11/17/2013); maloney dilation (N/A, 11/17/2013); Savory dilation (N/A, 11/17/2013); Carpal tunnel release; and Skin graft. Shelly Hanson has a current medication list which includes the following prescription(s): dicyclomine, docusate sodium, famotidine, fenofibrate, gabapentin, meloxicam, omeprazole, ondansetron, rosuvastatin, sumatriptan, vitamin d (ergocalciferol), and ciprofloxacin. She  reports that she has been smoking cigarettes. She has a 2.50 pack-year smoking history. She has never used smokeless tobacco. She reports that she does not drink alcohol or use drugs. Shelly Hanson is allergic to amoxicillin; codeine; flagyl [metronidazole]; naproxen; nitrofurantoin; sulfa antibiotics; and sulfur.   HPI  Today, she is being contacted for worsening of previously known (established) problem.  The patient indicates that she has been having more low back pain, bilaterally, with the right being worse than the left.  She has also been  experiencing pain in both legs, but the right is definitely worse than the left.  She refers that comparing the low back  pain and leg pain, the low back pain tends to be more intense and is worse than the leg pain.  In case of the right lower extremity pain this goes down to the area of the knee through the lateral aspect of the leg, but no numbness or weakness described.  The patient has requested to come in for a repeat treatment of the lower back pain.  In the past we have done to bilateral lumbar facet blocks under fluoroscopic guidance and IV sedation.  Pharmacotherapy Assessment  Analgesic: No opioid analgesics prescribed by our practice.  No opioids secondary to unreported THC found on 05/12/2016 UDS.   Monitoring: [place] PMP: PDMP reviewed during this encounter.       Pharmacotherapy: No side-effects or adverse reactions reported. Compliance: No problems identified. Effectiveness: Clinically acceptable. Plan: Refer to "POC".  UDS:  Summary  Date Value Ref Range Status  05/12/2016 FINAL  Final    Comment:    ==================================================================== TOXASSURE COMP DRUG ANALYSIS,UR ==================================================================== Test                             Result       Flag       Units Drug Present and Declared for Prescription Verification   Gabapentin                     PRESENT      EXPECTED Drug Present not Declared for Prescription Verification   Carboxy-THC                    150          UNEXPECTED ng/mg creat    Carboxy-THC is a metabolite of tetrahydrocannabinol  (THC).    Source of [facility] is most commonly illicit, but THC is also present    in a scheduled prescription medication. Drug Absent but Declared for Prescription Verification   Butalbital                     Not Detected UNEXPECTED   Tramadol                       Not Detected UNEXPECTED   Venlafaxine                    Not Detected UNEXPECTED   Acetaminophen                   Not Detected UNEXPECTED    Acetaminophen, as indicated in the declared medication list, is    not always detected even when used as directed. ==================================================================== Test                      Result    Flag   Units      Ref Range   Creatinine              141              mg/dL      >=20 ==================================================================== Declared Medications:  The flagging and interpretation on this report are based on the  following declared medications.  Unexpected results may arise from  inaccuracies in the declared medications.  **Note: The testing scope of this panel includes these medications:  Butalbital (Butalbital/APAP/Caffeine)  Gabapentin  Tramadol  Venlafaxine  **Note: The testing scope of this panel does not include small to  moderate amounts of these reported medications:  Acetaminophen (Butalbital/APAP/Caffeine)  **Note: The testing scope of this panel does not include following  reported medications:  Caffeine (Butalbital/APAP/Caffeine)  Dicyclomine  Docusate  Estradiol  Fluticasone  Medroxyprogesterone  Meloxicam  Omega-3 Fatty Acids  Omeprazole  Ondansetron  Ranitidine  Rosuvastatin  Vitamin D2 (Ergocalciferol) ==================================================================== For clinical consultation, please call [phone]. ====================================================================    Laboratory Chemistry Profile   Renal Lab Results  Component Value Date   BUN 20 06/16/2017   CREATININE 0.88 06/16/2017   GFRAA >60 06/16/2017   GFRNONAA >60 06/16/2017     Hepatic Lab Results  Component Value Date   AST 19 05/12/2016   ALT 16 05/12/2016   ALBUMIN 4.4 05/12/2016   ALKPHOS 71 05/12/2016   LIPASE 20 07/12/2015     Electrolytes Lab Results  Component Value Date   NA 141 06/16/2017   K 3.8 06/16/2017   CL 105 06/16/2017   CALCIUM 9.0 06/16/2017   MG  2.0 05/12/2016     Bone Lab Results  Component Value Date   25OHVITD1 55 05/12/2016   25OHVITD2 51 05/12/2016   25OHVITD3 3.7 05/12/2016     Inflammation (CRP: Acute Phase) (ESR: Chronic Phase) Lab Results  Component Value Date   CRP <0.8 05/12/2016   ESRSEDRATE 4 05/12/2016       Note: Above Lab results reviewed.  Imaging  DG Elbow Complete Left CLINICAL DATA:  Recent fall with left elbow pain, initial encounter  EXAM: LEFT ELBOW - COMPLETE 3+ VIEW  COMPARISON:  None.  FINDINGS: There is no evidence of fracture, dislocation, or joint effusion. There is no evidence of arthropathy or other focal bone abnormality. Soft tissues are unremarkable.  IMPRESSION: No acute abnormality noted.  Electronically Signed   By: Inez Catalina M.D.   On: 04/28/2019 22:58 DG Wrist Complete Left CLINICAL DATA:  Recent fall with wrist pain, initial encounter  EXAM: LEFT WRIST - COMPLETE 3+ VIEW  COMPARISON:  06/16/2017  FINDINGS: Comminuted fracture of the distal left radius is noted with impaction and posterior angulation at the fracture site. Ulnar styloid fracture is noted as well. Healed distal ulnar shaft fracture is noted. No carpal abnormality is seen. Soft tissue swelling is noted. Intra-articular involvement of the radial fracture is seen.  IMPRESSION: Distal radial and ulnar fractures with intra-articular involvement of the radial fracture.  Healed distal ulnar shaft fracture when compared with the prior exam.  Electronically Signed   By: Inez Catalina M.D.   On: 04/28/2019 22:56  Assessment  The primary encounter diagnosis was Chronic pain syndrome. Diagnoses of Chronic low back pain (Primary Area of Pain) (Midline) (Bilateral) (R>L), Lumbar facet syndrome (Bilateral) (R>L), Chronic lower extremity pain (Secondary area of Pain) (Right), Chronic lumbar radicular pain (Right) (L5), and DDD (degenerative disc disease), lumbosacral were also pertinent to this  visit.  Plan of Care  Problem-specific:  No problem-specific Assessment & Plan notes found for this encounter.  Shelly Hanson has a current medication list which includes the following long-term medication(s): dicyclomine, famotidine, fenofibrate, gabapentin, meloxicam, omeprazole, and sumatriptan.  Pharmacotherapy (Medications Ordered): No orders of the defined types were placed in this encounter.  Orders:  Orders Placed This Encounter  Procedures  . LUMBAR FACET(MEDIAL BRANCH NERVE BLOCK) MBNB    Standing Status:   Future    Standing Expiration Date:   10/05/2019    Scheduling Instructions:     Procedure: Lumbar facet block (AKA.: Lumbosacral medial branch nerve block)  Side: Bilateral     Level: L3-4, L4-5, & L5-S1 Facets (L2, L3, L4, L5, & S1 Medial Branch Nerves)     Sedation: Patient's choice.     Timeframe: ASAA    Order Specific Question:   Where will this procedure be performed?    Answer:   [facility]  . LUMBAR FACET(MEDIAL BRANCH NERVE BLOCK) MBNB    Scheduling timeframe: (PRN procedure) Shelly Hanson will call when needed. Clinical indication: Axial low back pain. 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  Sedation: Usually done with sedation. (May be done without sedation if so desired by patient.) Requirements: NPO x 8 hrs.; Driver; Stop blood thinners. Interval: No sooner than two weeks for diagnostic or therapeutic. No sooner than every other month for palliative.    Standing Status:   Standing    Number of Occurrences:   5    Standing Expiration Date:   09/03/2020    Scheduling Instructions:     Procedure: Lumbar facet block (AKA.: Lumbosacral medial branch nerve block)     Level: L3-4, L4-5, & L5-S1 Facets (L2, L3, L4, L5, & S1 Medial Branch Nerves)     Laterality: Bilateral    Order Specific Question:   Where will this procedure be performed?    Answer:   [facility]  . Lumbar Epidural Injection    Standing Status:   Standing    Number  of Occurrences:   9    Standing Expiration Date:   03/06/2021    Scheduling Instructions:     Purpose: Diagnostic     Indication: Lower extremity pain/Sciatica right (M54.31).     Side: Right-sided     Level: L4-5     Sedation: Patient's choice.     TIMEFRAME: PRN procedure. (Shelly Hanson will call when needed.)    Order Specific Question:   Where will this procedure be performed?    Answer:   [facility]  . Lumbar Transforaminal Epidural    Standing Status:   Standing    Number of Occurrences:   1    Standing Expiration Date:   09/03/2020    Scheduling Instructions:     Purpose: Diagnostic     Indication: Lower extremity pain. Radiculopathy/Radiculitis lumbosacral (M54.17).     Side: Right-sided     Level: L5     Sedation: Patient's choice.     TIMEFRAME: PRN procedure. (Shelly Hanson will call when needed.)    Order Specific Question:   Where will this procedure be performed?    Answer:   [facility]   Follow-up plan:   Return for Procedure (w/ sedation): (R) L-FCT BLK #3, (ASAP).      Interventional management options: Planned, scheduled, and/or pending:   Physical therapy trial for the patient's low back pain.  If the patient fails this trial, then we will proceed with radiofrequency of the lumbar facets.  Meanwhile, if the patient has a flareup of her low back pain we will bring her back for a palliative bilateral lumbar facet block under fluoroscopic guidance and IV sedation   Considering:   Diagnosticbilateral-sided lumbar facet block#3  Possiblebilateral-sided lumbar facet RFA DiagnosticRight L4-5 lumbar epiduralsteroid injection  DiagnosticRight cervical epidural steroid injection   Palliative PRN treatment(s):   Palliative bilateral lumbar facet block #3 under fluoroscopic guidance and IV sedation    Recent Visits No visits were found meeting these conditions.  Showing recent visits within past 90 days and meeting all other requirements    Today's Visits  Date Type Provider Dept  09/04/19 Telemedicine Milinda Pointer, MD [facility]  Showing today's visits and meeting all other requirements   Future Appointments No visits were found meeting these conditions.  Showing future appointments within next 90 days and meeting all other requirements   I discussed the assessment and treatment plan with the patient. The patient was provided an opportunity to ask questions and all were answered. The patient agreed with the plan and demonstrated an understanding of the instructions.  Patient advised to call back or seek an in-person evaluation if the symptoms or condition worsens.  Duration of encounter: 15 minutes.  Note by: Shelly Cola, MD Date: 09/04/2019; Time: 10:32 AM

## 2019-09-04 ENCOUNTER — Ambulatory Visit: Payer: Medicare HMO | Attending: Pain Medicine | Admitting: Pain Medicine

## 2019-09-04 ENCOUNTER — Other Ambulatory Visit: Payer: Self-pay

## 2019-09-04 DIAGNOSIS — G894 Chronic pain syndrome: Secondary | ICD-10-CM

## 2019-09-04 DIAGNOSIS — M5410 Radiculopathy, site unspecified: Secondary | ICD-10-CM

## 2019-09-04 DIAGNOSIS — M5442 Lumbago with sciatica, left side: Secondary | ICD-10-CM | POA: Diagnosis not present

## 2019-09-04 DIAGNOSIS — M79604 Pain in right leg: Secondary | ICD-10-CM | POA: Diagnosis not present

## 2019-09-04 DIAGNOSIS — M47816 Spondylosis without myelopathy or radiculopathy, lumbar region: Secondary | ICD-10-CM | POA: Diagnosis not present

## 2019-09-04 DIAGNOSIS — G8929 Other chronic pain: Secondary | ICD-10-CM

## 2019-09-04 DIAGNOSIS — M5137 Other intervertebral disc degeneration, lumbosacral region: Secondary | ICD-10-CM

## 2019-09-04 DIAGNOSIS — M5441 Lumbago with sciatica, right side: Secondary | ICD-10-CM

## 2019-09-04 NOTE — Patient Instructions (Signed)
____________________________________________________________________________________________  Preparing for Procedure with Sedation  Procedure appointments are limited to planned procedures: . No Prescription Refills. . No disability issues will be discussed. . No medication changes will be discussed.  Instructions: . Oral Intake: Do not eat or drink anything for at least 8 hours prior to your procedure. (Exception: Blood Pressure Medication. See below.) . Transportation: Unless otherwise stated by your physician, you may drive yourself after the procedure. . Blood Pressure Medicine: Do not forget to take your blood pressure medicine with a sip of water the morning of the procedure. If your Diastolic (lower reading)is above 100 mmHg, elective cases will be cancelled/rescheduled. . Blood thinners: These will need to be stopped for procedures. Notify our staff if you are taking any blood thinners. Depending on which one you take, there will be specific instructions on how and when to stop it. . Diabetics on insulin: Notify the staff so that you can be scheduled 1st case in the morning. If your diabetes requires high dose insulin, take only  of your normal insulin dose the morning of the procedure and notify the staff that you have done so. . Preventing infections: Shower with an antibacterial soap the morning of your procedure. . Build-up your immune system: Take 1000 mg of Vitamin C with every meal (3 times a day) the day prior to your procedure. . Antibiotics: Inform the staff if you have a condition or reason that requires you to take antibiotics before dental procedures. . Pregnancy: If you are pregnant, call and cancel the procedure. . Sickness: If you have a cold, fever, or any active infections, call and cancel the procedure. . Arrival: You must be in the facility at least 30 minutes prior to your scheduled procedure. . Children: Do not bring children with you. . Dress appropriately:  Bring dark clothing that you would not mind if they get stained. . Valuables: Do not bring any jewelry or valuables.  Reasons to call and reschedule or cancel your procedure: (Following these recommendations will minimize the risk of a serious complication.) . Surgeries: Avoid having procedures within 2 weeks of any surgery. (Avoid for 2 weeks before or after any surgery). . Flu Shots: Avoid having procedures within 2 weeks of a flu shots or . (Avoid for 2 weeks before or after immunizations). . Barium: Avoid having a procedure within 7-10 days after having had a radiological study involving the use of radiological contrast. (Myelograms, Barium swallow or enema study). . Heart attacks: Avoid any elective procedures or surgeries for the initial 6 months after a "Myocardial Infarction" (Heart Attack). . Blood thinners: It is imperative that you stop these medications before procedures. Let us know if you if you take any blood thinner.  . Infection: Avoid procedures during or within two weeks of an infection (including chest colds or gastrointestinal problems). Symptoms associated with infections include: Localized redness, fever, chills, night sweats or profuse sweating, burning sensation when voiding, cough, congestion, stuffiness, runny nose, sore throat, diarrhea, nausea, vomiting, cold or Flu symptoms, recent or current infections. It is specially important if the infection is over the area that we intend to treat. . Heart and lung problems: Symptoms that may suggest an active cardiopulmonary problem include: cough, chest pain, breathing difficulties or shortness of breath, dizziness, ankle swelling, uncontrolled high or unusually low blood pressure, and/or palpitations. If you are experiencing any of these symptoms, cancel your procedure and contact your primary care physician for an evaluation.  Remember:  Regular Business hours are:    Monday to Thursday 8:00 AM to 4:00 PM  Provider's  Schedule: Tai Syfert, MD:  Procedure days: Tuesday and Thursday 7:30 AM to 4:00 PM  Bilal Lateef, MD:  Procedure days: Monday and Wednesday 7:30 AM to 4:00 PM ____________________________________________________________________________________________    

## 2019-11-13 ENCOUNTER — Other Ambulatory Visit: Payer: Self-pay | Admitting: Obstetrics and Gynecology

## 2019-11-13 ENCOUNTER — Other Ambulatory Visit (INDEPENDENT_AMBULATORY_CARE_PROVIDER_SITE_OTHER): Payer: Self-pay | Admitting: Internal Medicine

## 2020-01-25 ENCOUNTER — Telehealth (INDEPENDENT_AMBULATORY_CARE_PROVIDER_SITE_OTHER): Payer: Self-pay | Admitting: Internal Medicine

## 2020-01-25 NOTE — Telephone Encounter (Signed)
Patient was last seen in February 2021, if she is still having continued symptoms I would recommend an office visit follow-up for evaluation.  She can continue the dicyclomine in the interim and try GasX or Phazyme over the counter. Thanks.

## 2020-01-25 NOTE — Telephone Encounter (Signed)
Patient called stated she is taking 3 pills of the dicyclomine each day - states she is still having a lot of gas & bloating - please advise - ph# [phone]

## 2020-02-15 ENCOUNTER — Other Ambulatory Visit (HOSPITAL_COMMUNITY): Payer: Self-pay | Admitting: Nurse Practitioner

## 2020-02-15 DIAGNOSIS — Z1231 Encounter for screening mammogram for malignant neoplasm of breast: Secondary | ICD-10-CM

## 2020-03-15 ENCOUNTER — Other Ambulatory Visit (INDEPENDENT_AMBULATORY_CARE_PROVIDER_SITE_OTHER): Payer: Self-pay | Admitting: Internal Medicine

## 2020-04-17 ENCOUNTER — Ambulatory Visit (HOSPITAL_COMMUNITY): Payer: Medicare HMO

## 2020-05-09 ENCOUNTER — Ambulatory Visit (HOSPITAL_COMMUNITY): Payer: Medicare HMO

## 2020-05-28 ENCOUNTER — Ambulatory Visit (INDEPENDENT_AMBULATORY_CARE_PROVIDER_SITE_OTHER): Payer: Medicare HMO | Admitting: Internal Medicine

## 2020-06-10 ENCOUNTER — Ambulatory Visit (INDEPENDENT_AMBULATORY_CARE_PROVIDER_SITE_OTHER): Payer: Medicare HMO | Admitting: Gastroenterology

## 2020-06-24 ENCOUNTER — Ambulatory Visit (HOSPITAL_COMMUNITY)
Admission: RE | Admit: 2020-06-24 | Discharge: 2020-06-24 | Disposition: A | Discharge status: Home / Self Care | Payer: Medicare HMO | Source: Ambulatory Visit | Attending: Nurse Practitioner | Admitting: Nurse Practitioner

## 2020-06-24 ENCOUNTER — Other Ambulatory Visit: Payer: Self-pay

## 2020-06-24 DIAGNOSIS — Z1231 Encounter for screening mammogram for malignant neoplasm of breast: Secondary | ICD-10-CM | POA: Insufficient documentation

## 2020-08-19 ENCOUNTER — Telehealth (INDEPENDENT_AMBULATORY_CARE_PROVIDER_SITE_OTHER): Payer: Self-pay

## 2020-08-19 ENCOUNTER — Other Ambulatory Visit (INDEPENDENT_AMBULATORY_CARE_PROVIDER_SITE_OTHER): Payer: Self-pay

## 2020-08-19 DIAGNOSIS — K588 Other irritable bowel syndrome: Secondary | ICD-10-CM

## 2020-08-19 DIAGNOSIS — Z8719 Personal history of other diseases of the digestive system: Secondary | ICD-10-CM

## 2020-08-19 DIAGNOSIS — K219 Gastro-esophageal reflux disease without esophagitis: Secondary | ICD-10-CM

## 2020-08-19 MED ORDER — DICYCLOMINE HCL 10 MG PO CAPS: 10.0000 mg | ORAL_CAPSULE | Freq: Three times a day (TID) | ORAL | 1 refills | Status: DC

## 2020-08-19 NOTE — Telephone Encounter (Signed)
[facility] pharmacy [phone] is calling stating patient needs a 90 days supply of refill on Dicyclomine 10 mg TID. Patient last seen 05/23/2019 for IBS and Genella Rife by Dr. Karilyn Cota on 05/23/2019, no showed 06/10/2020 for appointment with Dr. Katrinka Blazing. Has an appointment with Dr. Levon Hedger on 12/12/2020.

## 2020-09-11 ENCOUNTER — Other Ambulatory Visit (INDEPENDENT_AMBULATORY_CARE_PROVIDER_SITE_OTHER): Payer: Self-pay

## 2020-09-11 MED ORDER — OMEPRAZOLE 40 MG PO CPDR: 40.0000 mg | DELAYED_RELEASE_CAPSULE | Freq: Every day | ORAL | 1 refills | Status: DC

## 2020-10-03 ENCOUNTER — Ambulatory Visit (INDEPENDENT_AMBULATORY_CARE_PROVIDER_SITE_OTHER): Payer: Medicare HMO | Admitting: Gastroenterology

## 2020-10-03 ENCOUNTER — Encounter (INDEPENDENT_AMBULATORY_CARE_PROVIDER_SITE_OTHER): Payer: Self-pay | Admitting: Gastroenterology

## 2020-10-03 ENCOUNTER — Other Ambulatory Visit: Payer: Self-pay

## 2020-10-03 DIAGNOSIS — K5900 Constipation, unspecified: Secondary | ICD-10-CM | POA: Diagnosis not present

## 2020-10-03 MED ORDER — LINACLOTIDE 145 MCG PO CAPS: 145.0000 ug | ORAL_CAPSULE | Freq: Every day | ORAL | 3 refills | Status: DC

## 2020-10-03 NOTE — Progress Notes (Signed)
Katrinka Blazing, M.D. Gastroenterology & Hepatology [facility] [address] [place], [place] [address]  Primary Care Physician: Erasmo Downer, NP [address] [place] [address]  I will communicate my assessment and recommendations to the referring MD via EMR.  Problems: Chronic constipation IBS GERD  History of Present Illness: Shelly Hanson is a 60 y.o. female with past medical history of GERD, hyperlipidemia, IBS, chronic constipation, who presents for follow up of constipation.  The patient was last seen on 05/23/2019. At that time, the patient was advised to take dicyclomine before each meal to improve her abdominal pain episodes.  She was also given a prescription of omeprazole for episode of GERD.  Patient reports she has a bowel movement every 2-3 days, although there is no specific pattern for this.  She has had long-term history of constipation which is very severe.Patient reports that she has had to push in her perineal/vagina area for stool to come out intermittently. She has had to do this for a long time. She reports that she has had to do it since her last miscarriage multiple years ago. She also reports having some episodes of urgent bowel movements in the past but no accidents or diarrhea. Denies having any melena or hematochezia.  She reports that she has felt some bump coming out from her perianal area when she has to strain significantly.  The patient denies having any nausea, vomiting, fever, chills, hematochezia, melena, hematemesis, abdominal distention, abdominal pain, diarrhea, jaundice, pruritus or weight loss.  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 This stricture was dilated with balloon dilator to 18 mm. Moderate size sliding hiatal hernia. Erosive antral gastritis   Colonoscopy findings; Normal colonoscopy.  Past Medical History: Past Medical  History:  Diagnosis Date   Abdominal wall pain in right lower quadrant 09/04/2014   GERD (gastroesophageal reflux disease)    High cholesterol    Hyperlipemia    IBS (irritable bowel syndrome)    Lumbar facet joint syndrome    Migraine headache 12/18/2014   Pinched nerve    pinched nerve in back    Pinched nerve in neck    Sciatic leg pain    Vitamin D deficiency disease     Past Surgical History: Past Surgical History:  Procedure Laterality Date   BALLOON DILATION N/A 11/17/2013   Procedure: BALLOON DILATION;  Surgeon: Malissa Hippo, MD;  [facility];  Service: Endoscopy;  Laterality: N/A;   CARPAL TUNNEL RELEASE     CHOLECYSTECTOMY     COLONOSCOPY N/A 11/17/2013   Procedure: COLONOSCOPY;  Surgeon: Malissa Hippo, MD;  [facility];  Service: Endoscopy;  Laterality: N/A;  1030   DILATION AND CURETTAGE OF UTERUS     x 2    ESOPHAGOGASTRODUODENOSCOPY N/A 11/17/2013   Procedure: ESOPHAGOGASTRODUODENOSCOPY (EGD);  Surgeon: Malissa Hippo, MD;  [facility];  Service: Endoscopy;  Laterality: N/A;   FOOT SURGERY     for a fx.    [name] N/A 11/17/2013   Procedure: [name] DILATION;  Surgeon: Malissa Hippo, MD;  [facility];  Service: Endoscopy;  Laterality: N/A;   SAVORY DILATION N/A 11/17/2013   Procedure: SAVORY DILATION;  Surgeon: Malissa Hippo, MD;  [facility];  Service: Endoscopy;  Laterality: N/A;   SKIN GRAFT      Family History: Family History  Problem Relation Age of Onset  Colon cancer Father    Hypertension Father    Seizures Father    Cancer Father    Depression Mother    Diabetes Mother    Hyperlipidemia Mother    Hyperlipidemia Brother    Heart disease Brother    Hyperlipidemia Brother     Social History: Social History   Tobacco Use  Smoking Status Every Day   Packs/day: 0.25   Years: 10.00   Pack years: 2.50   Types: Cigarettes  Smokeless Tobacco Never  Tobacco Comments   1  pack every 3 days greater than 10 yrs   Social History   Substance and Sexual Activity  Alcohol Use No   Social History   Substance and Sexual Activity  Drug Use No    Allergies: Allergies  Allergen Reactions   Amoxicillin    Codeine    Elemental Sulfur    Flagyl [Metronidazole]    Naproxen    Nitrofurantoin Other (See Comments)    Constipation, nausea, stomach cramps   Sulfa Antibiotics     Medications: Current Outpatient Medications  Medication Sig Dispense Refill   dicyclomine (BENTYL) 10 MG capsule Take 1 capsule (10 mg total) by mouth 3 (three) times daily before meals. 270 capsule 1   docusate sodium (COLACE) 100 MG capsule Take 100 mg by mouth daily. Takes on M,W,F     famotidine (PEPCID) 20 MG tablet Take 20 mg by mouth at bedtime as needed for heartburn or indigestion. Take at 1700     FENOFIBRATE PO Take 148 mg by mouth daily. Take 1/2 pill each day     gabapentin (NEURONTIN) 100 MG capsule Take 200 mg by mouth 3 (three) times daily.      meloxicam (MOBIC) 15 MG tablet Take 15 mg by mouth daily. Take at noon     Multiple Vitamins-Minerals (ICAPS AREDS 2 PO) Take by mouth. One po BID     nortriptyline (PAMELOR) 25 MG capsule Take 25 mg by mouth daily at 6 (six) AM.     omeprazole (PRILOSEC) 40 MG capsule Take 1 capsule (40 mg total) by mouth daily before breakfast. 90 capsule 1   ondansetron (ZOFRAN-ODT) 8 MG disintegrating tablet DISSOLVE 1 TABLET BY MOUTH ONCE EVERY 8 HOURS AS NEEDED FOR NAUSEA & VOMITING 30 tablet 2   rizatriptan (MAXALT) 10 MG tablet Take 10 mg by mouth as needed for migraine. May repeat in 2 hours if needed     rosuvastatin (CRESTOR) 20 MG tablet Take 20 mg by mouth daily.     Vitamin D, Ergocalciferol, (DRISDOL) 50000 UNITS CAPS capsule Take 50,000 Units by mouth every 7 (seven) days. Takes on Saturdays.     No current facility-administered medications for this visit.    Review of Systems: GENERAL: negative for malaise, night  sweats HEENT: No changes in hearing or vision, no nose bleeds or other nasal problems. NECK: Negative for lumps, goiter, pain and significant neck swelling RESPIRATORY: Negative for cough, wheezing CARDIOVASCULAR: Negative for chest pain, leg swelling, palpitations, orthopnea GI: SEE HPI MUSCULOSKELETAL: Negative for joint pain or swelling, back pain, and muscle pain. SKIN: Negative for lesions, rash PSYCH: Negative for sleep disturbance, mood disorder and recent psychosocial stressors. HEMATOLOGY Negative for prolonged bleeding, bruising easily, and swollen nodes. ENDOCRINE: Negative for cold or heat intolerance, polyuria, polydipsia and goiter. NEURO: negative for tremor, gait imbalance, syncope and seizures. The remainder of the review of systems is noncontributory.   Physical Exam: BP 131/88 (BP Location: Right Arm, Patient Position:  Sitting, Cuff Size: Large)   Pulse (!) 102   Temp 99.1 F (37.3 C) (Oral)   Ht 5' 5.5" (1.664 m)   Wt 219 lb (99.3 kg)   BMI 35.89 kg/m  GENERAL: The patient is AO x3, in no acute distress. Obese. HEENT: Head is normocephalic and atraumatic. EOMI are intact. Mouth is well hydrated and without lesions. NECK: Supple. No masses LUNGS: Clear to auscultation. No presence of rhonchi/wheezing/rales. Adequate chest expansion HEART: RRR, normal s1 and s2. ABDOMEN: mildly tender in thje lower inguinal area, no guarding, no peritoneal signs, and nondistended. BS +. No masses. EXTREMITIES: Without any cyanosis, clubbing, rash, lesions or edema. NEUROLOGIC: AOx3, no focal motor deficit. SKIN: no jaundice, no rashes  Imaging/Labs: as above  I personally reviewed and interpreted the available labs, imaging and endoscopic files.  Impression and Plan: Shelly Hanson is a 60 y.o. female with past medical history of GERD, hyperlipidemia, IBS, chronic constipation, who presents for follow up of constipation.  The patient has presented a longstanding history  of constipation that has not improved with different over-the-counter laxatives.  She tells me that she has had to perform some manual maneuvers to defecate, which I found very interesting as she does not remove the stool manually but she presses the vagina/perianal area.  This raises the concern for some pelvic floor alteration, especially as she presented this after her most recent miscarriage.  I had a very thorough discussion with the patient regarding the way to evaluate this further, for which I will schedule defecography and an anorectal manometry.  The patient understood and agreed.  Since she may have some concomitant IBS-C, she will be started on Linzess 145 mcg every day.  Patient understood and agreed.  -Schedule defecography  -Schedule anorectal manometry -Start Linzess 145 mcg every day  All questions were answered.      Dolores Frame, MD Gastroenterology and Hepatology [facility]

## 2020-10-15 ENCOUNTER — Telehealth (INDEPENDENT_AMBULATORY_CARE_PROVIDER_SITE_OTHER): Payer: Self-pay | Admitting: *Deleted

## 2020-10-15 NOTE — Telephone Encounter (Signed)
Patient left message in regards to scheduling these tests - she states [place] too far to go and wants to go somewhere closer - I finally got in touch with her today and explained [facility] or [place] were her choices and [place] the [place] the 2 - she said she would get back with [place]

## 2020-10-15 NOTE — Telephone Encounter (Signed)
Thanks

## 2020-12-12 ENCOUNTER — Ambulatory Visit (INDEPENDENT_AMBULATORY_CARE_PROVIDER_SITE_OTHER): Payer: Medicare HMO | Admitting: Gastroenterology

## 2021-01-20 ENCOUNTER — Other Ambulatory Visit (INDEPENDENT_AMBULATORY_CARE_PROVIDER_SITE_OTHER): Payer: Self-pay | Admitting: Gastroenterology

## 2021-01-20 DIAGNOSIS — K588 Other irritable bowel syndrome: Secondary | ICD-10-CM

## 2021-01-20 DIAGNOSIS — Z8719 Personal history of other diseases of the digestive system: Secondary | ICD-10-CM

## 2021-01-20 DIAGNOSIS — K219 Gastro-esophageal reflux disease without esophagitis: Secondary | ICD-10-CM

## 2021-01-21 NOTE — Telephone Encounter (Signed)
Seen 10/03/20

## 2021-05-26 ENCOUNTER — Ambulatory Visit: Payer: Medicare HMO | Admitting: Pain Medicine

## 2021-06-02 ENCOUNTER — Other Ambulatory Visit (INDEPENDENT_AMBULATORY_CARE_PROVIDER_SITE_OTHER): Payer: Self-pay

## 2021-06-02 DIAGNOSIS — K5900 Constipation, unspecified: Secondary | ICD-10-CM

## 2021-06-02 MED ORDER — LINACLOTIDE 145 MCG PO CAPS: 145.0000 ug | ORAL_CAPSULE | Freq: Every day | ORAL | 3 refills | Status: DC

## 2021-08-19 ENCOUNTER — Telehealth (INDEPENDENT_AMBULATORY_CARE_PROVIDER_SITE_OTHER): Payer: Self-pay | Admitting: *Deleted

## 2021-08-19 NOTE — Telephone Encounter (Signed)
Called and discussed with patient to increase the linzess to 2 capsules every day and she verbalize understanding.  ?

## 2021-08-19 NOTE — Telephone Encounter (Signed)
Please ask her to increase the Linzess to 2 capsules every day ?Thanks ?

## 2021-08-19 NOTE — Telephone Encounter (Signed)
Patient last seen 10/03/20 for constipation and started on linzess 145 mcg. She is taking one every afternoon and dicyclomine one tid before meals. For past 3 days having cramping all over abdomen, last BM 3 days ago and it was hard to come out. No fever. Appetite is good. ? ?[phone] ?

## 2021-09-03 ENCOUNTER — Telehealth (INDEPENDENT_AMBULATORY_CARE_PROVIDER_SITE_OTHER): Payer: Self-pay

## 2021-09-03 NOTE — Telephone Encounter (Signed)
Patient aware of all.

## 2021-09-03 NOTE — Telephone Encounter (Signed)
Patient called today stating she was told to take Linzess 145 mg two capsule per day for constipation a few weeks ago, and now she has diarrhea. I advised to skip a day or two of the linzess and resume 145 ever other day if she starts to see herself getting back constipated. ?

## 2021-10-23 ENCOUNTER — Other Ambulatory Visit (INDEPENDENT_AMBULATORY_CARE_PROVIDER_SITE_OTHER): Payer: Self-pay | Admitting: Gastroenterology

## 2021-10-23 DIAGNOSIS — K219 Gastro-esophageal reflux disease without esophagitis: Secondary | ICD-10-CM

## 2021-10-23 DIAGNOSIS — Z8719 Personal history of other diseases of the digestive system: Secondary | ICD-10-CM

## 2021-10-23 DIAGNOSIS — K588 Other irritable bowel syndrome: Secondary | ICD-10-CM

## 2021-11-06 ENCOUNTER — Other Ambulatory Visit (HOSPITAL_COMMUNITY): Payer: Self-pay | Admitting: Nurse Practitioner

## 2021-11-06 DIAGNOSIS — Z1231 Encounter for screening mammogram for malignant neoplasm of breast: Secondary | ICD-10-CM

## 2021-11-19 ENCOUNTER — Ambulatory Visit (HOSPITAL_COMMUNITY)
Admission: RE | Admit: 2021-11-19 | Discharge: 2021-11-19 | Disposition: A | Discharge status: Home / Self Care | Payer: Medicare HMO | Source: Ambulatory Visit | Attending: Nurse Practitioner | Admitting: Nurse Practitioner

## 2021-11-19 DIAGNOSIS — Z1231 Encounter for screening mammogram for malignant neoplasm of breast: Secondary | ICD-10-CM | POA: Insufficient documentation

## 2021-11-24 ENCOUNTER — Encounter (INDEPENDENT_AMBULATORY_CARE_PROVIDER_SITE_OTHER): Payer: Self-pay | Admitting: Gastroenterology

## 2021-11-24 ENCOUNTER — Ambulatory Visit (INDEPENDENT_AMBULATORY_CARE_PROVIDER_SITE_OTHER): Payer: Medicare HMO | Admitting: Gastroenterology

## 2021-11-24 ENCOUNTER — Telehealth (INDEPENDENT_AMBULATORY_CARE_PROVIDER_SITE_OTHER): Payer: Self-pay | Admitting: *Deleted

## 2021-11-24 DIAGNOSIS — K5900 Constipation, unspecified: Secondary | ICD-10-CM

## 2021-11-24 DIAGNOSIS — Z8719 Personal history of other diseases of the digestive system: Secondary | ICD-10-CM

## 2021-11-24 DIAGNOSIS — K219 Gastro-esophageal reflux disease without esophagitis: Secondary | ICD-10-CM | POA: Diagnosis not present

## 2021-11-24 MED ORDER — DICYCLOMINE HCL 10 MG PO CAPS: 10.0000 mg | ORAL_CAPSULE | Freq: Three times a day (TID) | ORAL | 3 refills | Status: DC

## 2021-11-24 MED ORDER — OMEPRAZOLE 40 MG PO CPDR: 40.0000 mg | DELAYED_RELEASE_CAPSULE | Freq: Every day | ORAL | 3 refills | Status: DC

## 2021-11-24 MED ORDER — LINACLOTIDE 290 MCG PO CAPS
290.0000 ug | ORAL_CAPSULE | Freq: Every day | ORAL | 3 refills | Status: DC
Start: 2021-11-24 — End: 2022-02-19

## 2021-11-24 MED ORDER — FAMOTIDINE 20 MG PO TABS: 20.0000 mg | ORAL_TABLET | Freq: Every evening | ORAL | 3 refills | Status: AC | PRN

## 2021-11-24 NOTE — Progress Notes (Signed)
Primary Care Physician:  Erasmo Downer, NP  Primary GI: Levon Hedger  Patient Location: Home   Provider Location: [facility]   Reason for Visit: GERD/constipation   Persons present on the virtual encounter, with roles: Kalianne Fetting L. Betsie Peckman, MSN, APRN, AGNP-C, Eevie Burpee, patient    Total time (minutes) spent on medical discussion: 18 minutes  Virtual Visit via Telephone visit is conducted virtually and was requested by patient.   I connected with Duanne Guess on 11/24/21 at  4:00 PM EDT by telephone and verified that I am speaking with the correct person using two identifiers.   I discussed the limitations, risks, security and privacy concerns of performing an evaluation and management service by telephone and the availability of in person appointments. I also discussed with the patient that there may be a patient responsible charge related to this service. The patient expressed understanding and agreed to proceed.  Chief Complaint  Patient presents with   Constipation    1 year follow up on GERD and constipation. Needs refills on all meds. Linzess she is not taking 2 per day and directions on script are one per day, needs dicyclomine, famotidine, and omeprazole.    History of Present Illness: Shelly Hanson is a 61 y.o. female with past medical history of GERD, hyperlipidemia, IBS, chronic constipation, who presents for follow up of constipation and GERD.   History:  Last seen June 2022, at that time having BM every 2-3 days, long history of constipation. Having to put pressure on perineal area to have a BM and a bump coming out of her perineal area when having to strain significantly.   Started on Linzess , scheduled for defecography and anorectal manometry though these were not completed by the patient, Linzess was increased to BID in may 2023.   Present:  Patient states that constipation is well managed for the most part, she is taking Linzess  145 mcg BID. She is having a BM usually everyday though sometimes she may go 2 days without having one. Initially she was having 4-5 BMs per day when starting the Linzess. Stools for the most part of soft and pass with ease, occasionally she has to strain some and having small stool pellets. She is not drinking much water.  She does eat a diet high in watermelon, cantaloupe and salads.  She has some occasional abdominal pain, located in RLQ quadrant (feels like near her ovaries) occurring very infrequently, pain goes away on its own. She has been having this for many years. She had GYN workup without any abnormalities. Sometimes pain improves with BM. She is taking dicyclomine TID with good results. She is seeing some toilet tissue hematochezia with straining, this occurs only once every few months. No blood noted in the toilet. Denies melena. No weight loss or lack of appetite. She reports that she did not feel comfortable complete the previously ordered anorectal manometry and defecography, she did not feel that she needed further testing either after the linzess was working for her.   GERD is fairly well controlled, though she has woken up a couple of times feeling like she could not catch her breath feeling like she had acid in her throat. This has occurred 3 times over the past few months. She reports bad heartburn during these times and has drank milk which seemed to help. Sometimes she will take an extra famotidine as well. Denies other cough or sore throat. Has occasional dysphagia, last was with a  very cold icy drink, no issues with foods getting stuck.   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 This stricture was dilated with balloon dilator to 18 mm. Moderate size sliding hiatal hernia. Erosive antral gastritis  Last Colonoscopy: 2015 normal   Repeat Colonoscopy  Past Medical History:  Diagnosis Date   Abdominal wall pain in right lower quadrant 09/04/2014    GERD (gastroesophageal reflux disease)    High cholesterol    Hyperlipemia    IBS (irritable bowel syndrome)    Lumbar facet joint syndrome    Migraine headache 12/18/2014   Pinched nerve    pinched nerve in back    Pinched nerve in neck    Sciatic leg pain    Vitamin D deficiency disease      Past Surgical History:  Procedure Laterality Date   BALLOON DILATION N/A 11/17/2013   Procedure: BALLOON DILATION;  Surgeon: Malissa Hippo, MD;  [facility];  Service: Endoscopy;  Laterality: N/A;   CARPAL TUNNEL RELEASE     CHOLECYSTECTOMY     COLONOSCOPY N/A 11/17/2013   Procedure: COLONOSCOPY;  Surgeon: Malissa Hippo, MD;  [facility];  Service: Endoscopy;  Laterality: N/A;  1030   DILATION AND CURETTAGE OF UTERUS     x 2    ESOPHAGOGASTRODUODENOSCOPY N/A 11/17/2013   Procedure: ESOPHAGOGASTRODUODENOSCOPY (EGD);  Surgeon: Malissa Hippo, MD;  [facility];  Service: Endoscopy;  Laterality: N/A;   FOOT SURGERY     for a fx.    [name] N/A 11/17/2013   Procedure: [name] DILATION;  Surgeon: Malissa Hippo, MD;  [facility];  Service: Endoscopy;  Laterality: N/A;   SAVORY DILATION N/A 11/17/2013   Procedure: SAVORY DILATION;  Surgeon: Malissa Hippo, MD;  [facility];  Service: Endoscopy;  Laterality: N/A;   SKIN GRAFT       Current Meds  Medication Sig   dicyclomine (BENTYL) 10 MG capsule TAKE 1 CAPSULE THREE TIMES DAILY BEFORE MEALS   famotidine (PEPCID) 20 MG tablet Take 20 mg by mouth at bedtime as needed for heartburn or indigestion. Take at 1700   FENOFIBRATE PO Take 148 mg by mouth daily. Take 1/2 pill each day   gabapentin (NEURONTIN) 100 MG capsule Take 200 mg by mouth 3 (three) times daily.    linaclotide (LINZESS) 145 MCG CAPS capsule Take 1 capsule (145 mcg total) by mouth daily.   meloxicam (MOBIC) 15 MG tablet Take 15 mg by mouth daily. Take at noon   Multiple Vitamins-Minerals (ICAPS AREDS 2 PO) Take  by mouth. One po BID   nortriptyline (PAMELOR) 50 MG capsule Take 50 mg by mouth daily at 6 (six) AM.   omeprazole (PRILOSEC) 40 MG capsule Take 1 capsule (40 mg total) by mouth daily before breakfast.   ondansetron (ZOFRAN-ODT) 8 MG disintegrating tablet DISSOLVE 1 TABLET BY MOUTH ONCE EVERY 8 HOURS AS NEEDED FOR NAUSEA & VOMITING   RIZATRIPTAN BENZOATE PO Take 10 mg by mouth as needed for migraine. May repeat in 2 hours if needed   rosuvastatin (CRESTOR) 20 MG tablet Take 20 mg by mouth daily.   Vitamin D, Ergocalciferol, (DRISDOL) 50000 UNITS CAPS capsule Take 50,000 Units by mouth every 7 (seven) days. Takes on Saturdays.     Family History  Problem Relation Age of Onset   Colon cancer Father    Hypertension Father    Seizures Father  Cancer Father    Depression Mother    Diabetes Mother    Hyperlipidemia Mother    Hyperlipidemia Brother    Heart disease Brother    Hyperlipidemia Brother     Social History   Socioeconomic History   Marital status: Married    Spouse name: Not on file   Number of children: Not on file   Years of education: Not on file   Highest education level: Not on file  Occupational History   Not on file  Tobacco Use   Smoking status: Every Day    Packs/day: 0.25    Years: 10.00    Total pack years: 2.50    Types: Cigarettes   Smokeless tobacco: Never   Tobacco comments:    1 pack every 3 days greater than 10 yrs  Vaping Use   Vaping Use: Never used  Substance and Sexual Activity   Alcohol use: No   Drug use: No   Sexual activity: Yes    Birth control/protection: Post-menopausal  Other Topics Concern   Not on file  Social History Narrative   Not on file   Social Determinants of Health   Financial Resource Strain: Not on file  Food Insecurity: Not on file  Transportation Needs: Not on file  Physical Activity: Not on file  Stress: Not on file  Social Connections: Not on file   Review of Systems: Gen: Denies fever, chills,  anorexia. Denies fatigue, weakness, weight loss.  CV: Denies chest pain, palpitations, syncope, peripheral edema, and claudication. Resp: Denies dyspnea at rest, cough, wheezing, coughing up blood, and pleurisy. GI: see HPI  Derm: Denies rash, itching, dry skin Psych: Denies depression, anxiety, memory loss, confusion. No homicidal or suicidal ideation.  Heme: Denies bruising, bleeding, and enlarged lymph nodes.  Observations/Objective: No distress. Unable to perform physical exam due to telephone encounter. No video available.   Assessment and Plan: Kylin Genna is a 61 y.o. female with past medical history of GERD, hyperlipidemia, IBS, chronic constipation, who presents for follow up of constipation and GERD.   GERD well managed on Omeprazole 40mg  and Famotidine 20mg  QHS, she reports a couple of episodes of waking up with acid regurgitation and coughing though this has been very infrequent. No dysphagia.  She was recommended again to proceed with reflux precautions Avoiding greasy, spicy, fried, citrus foods, and be mindful that caffeine, carbonated drinks, chocolate and alcohol can increase reflux symptoms, Stay upright 2-3 hours after eating, prior to lying down and avoid eating late in the evenings. She will make me aware if she begins to have more frequent breakthrough symptoms.  Constipation for the most part well managed on linzess BID. Usually has a BM daily, sometimes will go 1-2 days without one and then having some stool pellets. She does endorse very little water intake. She was encouraged to increase water to atleast 64 oz per day and continue with high fiber diet. Will continue on linzess at this time. Colonoscopy due in 2025.    -continue Linzess, Rx daily  -increase water intake to 64 oz per day -continue omeprazole, Rx for 40mg  daily -continue famotidine, Rx for 20mg  QHS -continue bentyl TID, Rx for 10mg  sent  All questions were answered, patient  verbalized understanding and is in agreement with plan as outlined above.   Follow Up Instructions: 1 year   I discussed the assessment and treatment plan with the patient. The patient was provided an opportunity to ask questions and all were  answered. The patient agreed with the plan and demonstrated an understanding of the instructions.   The patient was advised to call back or seek an in-person evaluation if the symptoms worsen or if the condition fails to improve as anticipated.  I provided 18 minutes of  NON face-to-face time during this telephone encounter  Joell Usman L. Jeanmarie Hubert, MSN, APRN, AGNP-C Adult-Gerontology Nurse Practitioner [facility]

## 2021-11-24 NOTE — Patient Instructions (Addendum)
-  continue Linzess, I will send which will be the same as you taking the twice a day  -increase water intake to 64 oz per day -continue with high fiber diet -continue omeprazole 40mg  daily (for acid reflux) -continue famotidine 20mg  QHS (for acid reflux)  -continue bentyl three times per day  I have sent refills of all of your medications, please make me aware of any worsening or new GI symptoms.  Follow up 1 year

## 2021-11-27 NOTE — Telephone Encounter (Signed)
Error

## 2022-01-23 ENCOUNTER — Encounter
Admission: RE | Admit: 2022-01-23 | Discharge: 2022-01-23 | Disposition: A | Discharge status: Home / Self Care | Payer: Medicare HMO | Source: Ambulatory Visit | Attending: Podiatry | Admitting: Podiatry

## 2022-01-23 ENCOUNTER — Other Ambulatory Visit: Payer: Self-pay

## 2022-01-23 HISTORY — DX: Family history of other specified conditions: Z84.89

## 2022-01-23 HISTORY — DX: Other specified postprocedural states: R11.2

## 2022-01-23 HISTORY — DX: Unspecified osteoarthritis, unspecified site: M19.90

## 2022-01-23 HISTORY — DX: Other specified postprocedural states: Z98.890

## 2022-01-23 NOTE — Patient Instructions (Addendum)
Your procedure is scheduled on: 01/30/22 - Friday Report to the Registration Desk on the 1st floor of the [facility]. To find out your arrival time, please call [phone] between 1PM - 3PM on: 01/29/22 - Thursday If your arrival time is 6:00 am, do not arrive prior to that time as the [facility] entrance doors do not open until 6:00 am.  REMEMBER: Instructions that are not followed completely may result in serious medical risk, up to and including death; or upon the discretion of your surgeon and anesthesiologist your surgery may need to be rescheduled.  Do not eat food after midnight the night before surgery.  No gum chewing, lozengers or hard candies.  You may however, drink CLEAR liquids up to 2 hours before you are scheduled to arrive for your surgery. Do not drink anything within 2 hours of your scheduled arrival time.  Clear liquids include: - water  - apple juice without pulp - gatorade (not RED colors) - black coffee or tea (Do NOT add milk or creamers to the coffee or tea) Do NOT drink anything that is not on this list.  TAKE THESE MEDICATIONS THE MORNING OF SURGERY WITH A SIP OF WATER:  - FENOFIBRATE  - gabapentin (NEURONTIN) - nortriptyline  - omeprazole (PRILOSEC)    One week prior to surgery: meloxicam (MOBIC) Stop Anti-inflammatories (NSAIDS) such as Advil, Aleve, Ibuprofen, Motrin, Naproxen, Naprosyn and Aspirin based products such as Excedrin, Goodys Powder, BC Powder.  Stop ANY OVER THE COUNTER supplements until after surgery.  You may however, continue to take Tylenol if needed for pain up until the day of surgery.  No Alcohol for 24 hours before or after surgery.  No Smoking including e-cigarettes for 24 hours prior to surgery.  No chewable tobacco products for at least 6 hours prior to surgery.  No nicotine patches on the day of surgery.  Do not use any "recreational" drugs for at least a week prior to your surgery.  Please be advised that the  combination of cocaine and anesthesia may have negative outcomes, up to and including death. If you test positive for cocaine, your surgery will be cancelled.  On the morning of surgery brush your teeth with toothpaste and water, you may rinse your mouth with mouthwash if you wish. Do not swallow any toothpaste or mouthwash.  Do not wear jewelry, make-up, hairpins, clips or nail polish.  Do not wear lotions, powders, or perfumes.   Do not shave body from the neck down 48 hours prior to surgery just in case you cut yourself which could leave a site for infection.  Also, freshly shaved skin may become irritated if using the CHG soap.  Contact lenses, hearing aids and dentures may not be worn into surgery.  Do not bring valuables to the hospital. [facility] is not responsible for any missing/lost belongings or valuables.   Notify your doctor if there is any change in your medical condition (cold, fever, infection).  Wear comfortable clothing (specific to your surgery type) to the hospital.  After surgery, you can help prevent lung complications by doing breathing exercises.  Take deep breaths and cough every 1-2 hours. Your doctor may order a device called an Incentive Spirometer to help you take deep breaths. When coughing or sneezing, hold a pillow firmly against your incision with both hands. This is called "splinting." Doing this helps protect your incision. It also decreases belly discomfort.  If you are being admitted to the hospital overnight, leave  your suitcase in the car. After surgery it may be brought to your room.  If you are being discharged the day of surgery, you will not be allowed to drive home. You will need a responsible adult (18 years or older) to drive you home and stay with you that night.   If you are taking public transportation, you will need to have a responsible adult (18 years or older) with you. Please confirm with your physician that it is acceptable to  use public transportation.   Please call the Welcome Dept. at [phone] if you have any questions about these instructions.  Surgery Visitation Policy:  Patients undergoing a surgery or procedure may have two family members or support persons with them as long as the person is not COVID-19 positive or experiencing its symptoms.   Inpatient Visitation:    Visiting hours are 7 a.m. to 8 p.m. Up to four visitors are allowed at one time in a patient room, including children. The visitors may rotate out with other people during the day. One designated support person (adult) may remain overnight.

## 2022-01-27 ENCOUNTER — Other Ambulatory Visit: Payer: Self-pay | Admitting: Podiatry

## 2022-01-29 MED ORDER — CHLORHEXIDINE GLUCONATE 0.12 % MT SOLN: 15.0000 mL | Freq: Once | OROMUCOSAL | Status: AC

## 2022-01-29 MED ORDER — CEFAZOLIN SODIUM-DEXTROSE 2-4 GM/100ML-% IV SOLN
2.0000 g | INTRAVENOUS | Status: AC
  Administered 2022-01-30: 2 g via INTRAVENOUS

## 2022-01-29 MED ORDER — ORAL CARE MOUTH RINSE: 15.0000 mL | Freq: Once | OROMUCOSAL | Status: AC

## 2022-01-29 MED ORDER — LACTATED RINGERS IV SOLN: INTRAVENOUS | Status: DC

## 2022-01-30 ENCOUNTER — Encounter
Admission: RE | Disposition: A | Discharge status: Home / Self Care | Payer: Self-pay | Source: Home / Self Care | Attending: Podiatry

## 2022-01-30 ENCOUNTER — Other Ambulatory Visit: Payer: Self-pay

## 2022-01-30 ENCOUNTER — Ambulatory Visit: Payer: Medicare HMO | Admitting: Urgent Care

## 2022-01-30 ENCOUNTER — Ambulatory Visit: Payer: Medicare HMO

## 2022-01-30 ENCOUNTER — Encounter: Payer: Self-pay | Admitting: Podiatry

## 2022-01-30 ENCOUNTER — Ambulatory Visit
Admission: RE | Admit: 2022-01-30 | Discharge: 2022-01-30 | Disposition: A | Discharge status: Home / Self Care | Payer: Medicare HMO | Attending: Podiatry | Admitting: Podiatry

## 2022-01-30 DIAGNOSIS — K219 Gastro-esophageal reflux disease without esophagitis: Secondary | ICD-10-CM | POA: Diagnosis not present

## 2022-01-30 DIAGNOSIS — M2021 Hallux rigidus, right foot: Secondary | ICD-10-CM | POA: Insufficient documentation

## 2022-01-30 DIAGNOSIS — M2011 Hallux valgus (acquired), right foot: Secondary | ICD-10-CM | POA: Insufficient documentation

## 2022-01-30 HISTORY — PX: CHEILECTOMY: SHX1336

## 2022-01-30 SURGERY — CHEILECTOMY
Anesthesia: General | Site: Foot | Laterality: Right

## 2022-01-30 MED ORDER — LIDOCAINE HCL (CARDIAC) PF 100 MG/5ML IV SOSY
PREFILLED_SYRINGE | INTRAVENOUS | Status: DC | PRN
  Administered 2022-01-30: 100 mg via INTRAVENOUS

## 2022-01-30 MED ORDER — ACETAMINOPHEN 10 MG/ML IV SOLN
INTRAVENOUS | Status: DC | PRN
  Administered 2022-01-30: 1000 mg via INTRAVENOUS

## 2022-01-30 MED ORDER — PHENYLEPHRINE HCL-NACL 20-0.9 MG/250ML-% IV SOLN
INTRAVENOUS | Status: AC
  Filled 2022-01-30: qty 250

## 2022-01-30 MED ORDER — LIDOCAINE HCL (PF) 1 % IJ SOLN
INTRAMUSCULAR | Status: AC
  Filled 2022-01-30: qty 30

## 2022-01-30 MED ORDER — EPHEDRINE SULFATE (PRESSORS) 50 MG/ML IJ SOLN
INTRAMUSCULAR | Status: DC | PRN
  Administered 2022-01-30: 5 mg via INTRAVENOUS

## 2022-01-30 MED ORDER — BUPIVACAINE HCL (PF) 0.25 % IJ SOLN
INTRAMUSCULAR | Status: AC
  Filled 2022-01-30: qty 30

## 2022-01-30 MED ORDER — MIDAZOLAM HCL 2 MG/2ML IJ SOLN
INTRAMUSCULAR | Status: DC | PRN
  Administered 2022-01-30 (×2): 1 mg via INTRAVENOUS

## 2022-01-30 MED ORDER — BUPIVACAINE LIPOSOME 1.3 % IJ SUSP
INTRAMUSCULAR | Status: DC | PRN
  Administered 2022-01-30: 20 mL

## 2022-01-30 MED ORDER — FENTANYL CITRATE (PF) 100 MCG/2ML IJ SOLN
INTRAMUSCULAR | Status: DC | PRN
  Administered 2022-01-30 (×4): 25 ug via INTRAVENOUS

## 2022-01-30 MED ORDER — OXYCODONE-ACETAMINOPHEN 5-325 MG PO TABS: 1.0000 | ORAL_TABLET | Freq: Four times a day (QID) | ORAL | 0 refills | Status: DC | PRN

## 2022-01-30 MED ORDER — ACETAMINOPHEN 10 MG/ML IV SOLN
INTRAVENOUS | Status: AC
  Filled 2022-01-30: qty 100

## 2022-01-30 MED ORDER — KETOROLAC TROMETHAMINE 30 MG/ML IJ SOLN
INTRAMUSCULAR | Status: AC
  Filled 2022-01-30: qty 1

## 2022-01-30 MED ORDER — PROPOFOL 10 MG/ML IV BOLUS
INTRAVENOUS | Status: DC | PRN
  Administered 2022-01-30: 180 mg via INTRAVENOUS

## 2022-01-30 MED ORDER — DEXAMETHASONE SODIUM PHOSPHATE 10 MG/ML IJ SOLN
INTRAMUSCULAR | Status: DC | PRN
  Administered 2022-01-30: 10 mg via INTRAVENOUS

## 2022-01-30 MED ORDER — PROPOFOL 10 MG/ML IV BOLUS
INTRAVENOUS | Status: AC
  Filled 2022-01-30: qty 20

## 2022-01-30 MED ORDER — MIDAZOLAM HCL 2 MG/2ML IJ SOLN
INTRAMUSCULAR | Status: AC
  Filled 2022-01-30: qty 2

## 2022-01-30 MED ORDER — GLYCOPYRROLATE 0.2 MG/ML IJ SOLN
INTRAMUSCULAR | Status: DC | PRN
  Administered 2022-01-30: .2 mg via INTRAVENOUS

## 2022-01-30 MED ORDER — PHENYLEPHRINE 80 MCG/ML (10ML) SYRINGE FOR IV PUSH (FOR BLOOD PRESSURE SUPPORT)
PREFILLED_SYRINGE | INTRAVENOUS | Status: AC
  Filled 2022-01-30: qty 10

## 2022-01-30 MED ORDER — LIDOCAINE HCL (PF) 2 % IJ SOLN
INTRAMUSCULAR | Status: AC
  Filled 2022-01-30: qty 5

## 2022-01-30 MED ORDER — ONDANSETRON HCL 4 MG/2ML IJ SOLN
INTRAMUSCULAR | Status: DC | PRN
  Administered 2022-01-30: 4 mg via INTRAVENOUS

## 2022-01-30 MED ORDER — BUPIVACAINE LIPOSOME 1.3 % IJ SUSP
INTRAMUSCULAR | Status: AC
  Filled 2022-01-30: qty 10

## 2022-01-30 MED ORDER — KETOROLAC TROMETHAMINE 30 MG/ML IJ SOLN
INTRAMUSCULAR | Status: DC | PRN
  Administered 2022-01-30: 15 mg via INTRAVENOUS

## 2022-01-30 MED ORDER — 0.9 % SODIUM CHLORIDE (POUR BTL) OPTIME
TOPICAL | Status: DC | PRN
  Administered 2022-01-30 (×2): 500 mL

## 2022-01-30 MED ORDER — CEFAZOLIN SODIUM-DEXTROSE 2-4 GM/100ML-% IV SOLN
INTRAVENOUS | Status: AC
  Filled 2022-01-30: qty 100

## 2022-01-30 MED ORDER — ONDANSETRON HCL 4 MG/2ML IJ SOLN
INTRAMUSCULAR | Status: AC
  Filled 2022-01-30: qty 2

## 2022-01-30 MED ORDER — PHENYLEPHRINE HCL-NACL 20-0.9 MG/250ML-% IV SOLN
INTRAVENOUS | Status: DC | PRN
  Administered 2022-01-30: 50 ug/min via INTRAVENOUS

## 2022-01-30 MED ORDER — PHENYLEPHRINE HCL (PRESSORS) 10 MG/ML IV SOLN
INTRAVENOUS | Status: DC | PRN
  Administered 2022-01-30: 160 ug via INTRAVENOUS
  Administered 2022-01-30: 240 ug via INTRAVENOUS
  Administered 2022-01-30: 120 ug via INTRAVENOUS
  Administered 2022-01-30 (×2): 80 ug via INTRAVENOUS
  Administered 2022-01-30: 40 ug via INTRAVENOUS
  Administered 2022-01-30: 80 ug via INTRAVENOUS
  Administered 2022-01-30: 160 ug via INTRAVENOUS

## 2022-01-30 MED ORDER — FENTANYL CITRATE (PF) 100 MCG/2ML IJ SOLN
INTRAMUSCULAR | Status: AC
  Filled 2022-01-30: qty 2

## 2022-01-30 MED ORDER — DEXAMETHASONE SODIUM PHOSPHATE 10 MG/ML IJ SOLN
INTRAMUSCULAR | Status: AC
  Filled 2022-01-30: qty 1

## 2022-01-30 MED ORDER — LIDOCAINE-EPINEPHRINE 1 %-1:100000 IJ SOLN
INTRAMUSCULAR | Status: AC
  Filled 2022-01-30: qty 1

## 2022-01-30 MED ORDER — CHLORHEXIDINE GLUCONATE 0.12 % MT SOLN
OROMUCOSAL | Status: AC
  Administered 2022-01-30: 15 mL via OROMUCOSAL
  Filled 2022-01-30: qty 15

## 2022-01-30 SURGICAL SUPPLY — 56 items
BLADE MED AGGRESSIVE (BLADE) ×1 IMPLANT
BLADE OSC/SAGITTAL MD 9X18.5 (BLADE) IMPLANT
BLADE SURG 15 STRL LF DISP TIS (BLADE) ×2 IMPLANT
BLADE SURG 15 STRL SS (BLADE) ×2
BLADE SURG MINI STRL (BLADE) ×1 IMPLANT
BNDG CMPR 75X21 PLY HI ABS (MISCELLANEOUS) ×1
BNDG CMPR STD VLCR NS LF 5.8X4 (GAUZE/BANDAGES/DRESSINGS) ×1
BNDG ELASTIC 4X5.8 VLCR NS LF (GAUZE/BANDAGES/DRESSINGS) ×1 IMPLANT
BNDG ESMARK 4X12 TAN STRL LF (GAUZE/BANDAGES/DRESSINGS) ×1 IMPLANT
BNDG GAUZE DERMACEA FLUFF 4 (GAUZE/BANDAGES/DRESSINGS) ×1 IMPLANT
BNDG GZE 12X3 1 PLY HI ABS (GAUZE/BANDAGES/DRESSINGS) ×1
BNDG GZE DERMACEA 4 6PLY (GAUZE/BANDAGES/DRESSINGS) ×1
BNDG STRETCH GAUZE 3IN X12FT (GAUZE/BANDAGES/DRESSINGS) ×1 IMPLANT
CLIP FIXATION STAPLE 10X10X10 (Staple) IMPLANT
CUFF TOURN SGL QUICK 12 (TOURNIQUET CUFF) IMPLANT
CUFF TOURN SGL QUICK 18X4 (TOURNIQUET CUFF) IMPLANT
DRAPE FLUOR MINI C-ARM 54X84 (DRAPES) ×1 IMPLANT
DURAPREP 26ML APPLICATOR (WOUND CARE) ×1 IMPLANT
ELECT REM PT RETURN 9FT ADLT (ELECTROSURGICAL) ×1
ELECTRODE REM PT RTRN 9FT ADLT (ELECTROSURGICAL) ×1 IMPLANT
GAUZE SPONGE 4X4 12PLY STRL (GAUZE/BANDAGES/DRESSINGS) ×1 IMPLANT
GAUZE STRETCH 2X75IN STRL (MISCELLANEOUS) ×1 IMPLANT
GAUZE XEROFORM 1X8 LF (GAUZE/BANDAGES/DRESSINGS) ×1 IMPLANT
GLOVE BIO SURGEON STRL SZ7.5 (GLOVE) ×1 IMPLANT
GLOVE SURG UNDER LTX SZ8 (GLOVE) ×1 IMPLANT
GOWN STRL REUS W/ TWL XL LVL3 (GOWN DISPOSABLE) ×2 IMPLANT
GOWN STRL REUS W/TWL XL LVL3 (GOWN DISPOSABLE) ×2
KIT PROCEDURE DRILL (DRILL) IMPLANT
KIT TURNOVER KIT A (KITS) ×1 IMPLANT
LABEL OR SOLS (LABEL) ×1 IMPLANT
MANIFOLD NEPTUNE II (INSTRUMENTS) ×1 IMPLANT
NDL FILTER BLUNT 18X1 1/2 (NEEDLE) ×1 IMPLANT
NDL HYPO 25X1 1.5 SAFETY (NEEDLE) ×2 IMPLANT
NEEDLE FILTER BLUNT 18X1 1/2 (NEEDLE) ×1 IMPLANT
NEEDLE HYPO 25X1 1.5 SAFETY (NEEDLE) ×2 IMPLANT
NS IRRIG 500ML POUR BTL (IV SOLUTION) ×1 IMPLANT
PACK EXTREMITY ARMC (MISCELLANEOUS) ×1 IMPLANT
RASP SM TEAR CROSS CUT (RASP) ×1 IMPLANT
STOCKINETTE IMPERVIOUS 9X36 MD (GAUZE/BANDAGES/DRESSINGS) ×1 IMPLANT
STOCKINETTE M/LG 89821 (MISCELLANEOUS) ×1 IMPLANT
STRIP CLOSURE SKIN 1/4X4 (GAUZE/BANDAGES/DRESSINGS) ×1 IMPLANT
SUT MNCRL 4-0 (SUTURE) ×1
SUT MNCRL 4-0 27XMFL (SUTURE) ×1
SUT MNCRL+ 5-0 UNDYED PC-3 (SUTURE) ×1 IMPLANT
SUT MONOCRYL 5-0 (SUTURE)
SUT VIC AB 3-0 SH 27 (SUTURE) ×1
SUT VIC AB 3-0 SH 27X BRD (SUTURE) ×1 IMPLANT
SUT VIC AB 4-0 FS2 27 (SUTURE) ×1 IMPLANT
SUT VIC AB 4-0 SH 27 (SUTURE) ×1
SUT VIC AB 4-0 SH 27XANBCTRL (SUTURE) IMPLANT
SUTURE MNCRL 4-0 27XMF (SUTURE) IMPLANT
SWABSTK COMLB BENZOIN TINCTURE (MISCELLANEOUS) ×1 IMPLANT
SYR 10ML LL (SYRINGE) ×1 IMPLANT
TRAP FLUID SMOKE EVACUATOR (MISCELLANEOUS) ×1 IMPLANT
WATER STERILE IRR 1000ML POUR (IV SOLUTION) ×1 IMPLANT
WATER STERILE IRR 500ML POUR (IV SOLUTION) ×1 IMPLANT

## 2022-01-30 NOTE — Op Note (Signed)
Operative note   Surgeon:Emylee Decelle Lawyer: None    Preop diagnosis: 1.  Hallux rigidus right first MTPJ 2.  Hallux valgus right great toe    Postop diagnosis: Same    Procedure: 1.  Cheilectomy right first MTPJ 2.  Akin proximal phalanx osteotomy right proximal phalanx 3.  Intraoperative fluoroscopy use right foot    EBL: Minimal    Anesthesia:local and general.  Local consisted of a total of 10 cc of 0.25% bupivacaine plain and 10 cc of Exparel long-acting anesthetic    Hemostasis: Mid calf tourniquet inflated to 200 mmHg for just under 60 minutes    Specimen: None    Complications: None    Operative indications:Deronda Zailee Vallely is an 61 y.o. that presents today for surgical intervention.  The risks/benefits/alternatives/complications have been discussed and consent has been given.    Procedure:  Patient was brought into the OR and placed on the operating table in thesupine position. After anesthesia was obtained theright lower extremity was prepped and draped in usual sterile fashion.  Attention was directed to the dorsomedial right first MTPJ where longitudinal incision was performed proximal to the MTPJ to the level of the interphalangeal joint.  Sharp and blunt dissection carried down to the capsule.  A longitudinal capsulotomy was performed.  The head of the metatarsal and base of the proximal phalanx were then exposed.  A large amount of para-articular spurring was noted to both sides of the joint and these were then removed with a rongeur.  Further contouring of the metatarsal head was performed with a power saw and power rasp.  Further contouring of the base of the proximal phalanx was performed with a power rasp.  The joint was evaluated.  Greater than 60% of the articular cartilage was intact on the metatarsal head though there was obvious chondromalacia with areas of osteochondral defect in the very central portion of the metatarsal head.  The base of the proximal  phalanx had about 75% residual articular cartilage at this time.  This wound was then flushed with copious amounts of irrigation.  Attention was then directed to the base of the proximal phalanx.  A K wire was used for a cut guide.  Next a small V osteotomy with the apex lateral was performed.  The lateral hand was left intact.  The osteotomy was closed down.  This was then stabilized with a 10 x 10 x 10 mm compression staple.  Good alignment was noted.  At this time the wound was flushed with copious amounts of irrigation.  Layered closure was then performed with a 3-0 Vicryl for the capsule, 4-0 Vicryl for the subcutaneous tissue and a 4-0 Monocryl for the skin.  Intraoperative fluoroscopy was used to evaluate all of the peritubular spurring that was removed as well as pre and post osteotomy of the proximal phalanx.    Patient tolerated the procedure and anesthesia well.  Was transported from the OR to the PACU with all vital signs stable and vascular status intact. To be discharged per routine protocol.  Will follow up in approximately 1 week in the outpatient clinic.

## 2022-01-30 NOTE — Anesthesia Preprocedure Evaluation (Signed)
Anesthesia Evaluation  Patient identified by MRN, date of birth, ID band Patient awake  General Assessment Comment:  Patient says she has pONV hx but says it was because she was given flagyl (she is allergic to it). Has done well with other anesthetics.  Reviewed: Allergy & Precautions, NPO status , Patient's Chart, lab work & pertinent test results  History of Anesthesia Complications Negative for: history of anesthetic complications  Airway Mallampati: III  TM Distance: >3 FB Neck ROM: Full    Dental no notable dental hx. (+) Teeth Intact   Pulmonary neg sleep apnea, neg COPD, Current Smoker and Patient abstained from smoking.,    Pulmonary exam normal breath sounds clear to auscultation       Cardiovascular Exercise Tolerance: Good METS(-) hypertension(-) CAD and (-) Past MI negative cardio ROS  (-) dysrhythmias  Rhythm:Regular Rate:Normal - Systolic murmurs    Neuro/Psych  Headaches, negative psych ROS   GI/Hepatic GERD  Medicated and Controlled,(+)     (-) substance abuse  ,   Endo/Other  neg diabetes  Renal/GU negative Renal ROS     Musculoskeletal  (+) Arthritis ,   Abdominal   Peds  Hematology   Anesthesia Other Findings Past Medical History: 09/04/2014: Abdominal wall pain in right lower quadrant No date: Arthritis No date: Family history of adverse reaction to anesthesia     Comment:  brother has hard time going to sleep No date: GERD (gastroesophageal reflux disease) No date: High cholesterol No date: Hyperlipemia No date: IBS (irritable bowel syndrome) No date: Lumbar facet joint syndrome 12/18/2014: Migraine headache No date: Pinched nerve     Comment:  pinched nerve in back  No date: Pinched nerve in neck No date: PONV (postoperative nausea and vomiting) No date: Sciatic leg pain No date: Vitamin D deficiency disease  Reproductive/Obstetrics                              Anesthesia Physical Anesthesia Plan  ASA: 2  Anesthesia Plan: General   Post-op Pain Management: Ofirmev IV (intra-op)*   Induction: Intravenous  PONV Risk Score and Plan: 3 and Ondansetron, Dexamethasone, Midazolam and Scopolamine patch - Pre-op  Airway Management Planned: LMA  Additional Equipment: None  Intra-op Plan:   Post-operative Plan: Extubation in OR  Informed Consent: I have reviewed the patients History and Physical, chart, labs and discussed the procedure including the risks, benefits and alternatives for the proposed anesthesia with the patient or authorized representative who has indicated his/her understanding and acceptance.     Dental advisory given  Plan Discussed with: CRNA and Surgeon  Anesthesia Plan Comments: (Discussed risks of anesthesia with patient, including PONV, sore throat, lip/dental/eye damage. Rare risks discussed as well, such as cardiorespiratory and neurological sequelae, and allergic reactions. Discussed the role of CRNA in patient's perioperative care. Patient understands. Patient counseled on benefits of smoking cessation, and increased perioperative risks associated with continued smoking. )       Anesthesia Quick Evaluation

## 2022-01-30 NOTE — Discharge Instructions (Addendum)
[facility]  POST OPERATIVE INSTRUCTIONS FOR DR. FOWLER AND [facility] PODIATRY DEPARTMENT   Take your medication as prescribed.  Pain medication should be taken only as needed.  Keep the dressing clean, dry and intact.  Keep your foot elevated above the heart level for the first 48 hours.  Walking to the bathroom and brief periods of walking are acceptable, unless we have instructed you to be non-weight bearing.  Always wear your post-op shoe when walking.  Always use your crutches if you are to be non-weight bearing.  Do not take a shower. Baths are permissible as long as the foot is kept out of the water.   Every hour you are awake:  Bend your knee 15 times. Flex foot 15 times Massage calf 15 times  Call [facility] ([phone]) if any of the following problems occur: You develop a temperature or fever. The bandage becomes saturated with blood. Medication does not stop your pain. Injury of the foot occurs. Any symptoms of infection including redness, odor, or red streaks running from wound.    AMBULATORY SURGERY  DISCHARGE INSTRUCTIONS   The drugs that you were given will stay in your system until tomorrow so for the next 24 hours you should not:  Drive an automobile Make any legal decisions Drink any alcoholic beverage   You may resume regular meals tomorrow.  Today it is better to start with liquids and gradually work up to solid foods.  You may eat anything you prefer, but it is better to start with liquids, then soup and crackers, and gradually work up to solid foods.   Please notify your doctor immediately if you have any unusual bleeding, trouble breathing, redness and pain at the surgery site, drainage, fever, or pain not relieved by medication.    Additional Instructions:        Please contact your physician with any problems or Same Day Surgery at [phone], Monday through  Friday 6 am to 4 pm, or [facility] number at [phone]. 

## 2022-01-30 NOTE — Anesthesia Postprocedure Evaluation (Signed)
Anesthesia Post Note  Patient: [name]  Procedure(s) Performed: RIGHT GREAT TOE CHEILECTOMY WITH AIKEN'S OSTEOTOMY (Right: Foot)  Patient location during evaluation: PACU Anesthesia Type: General Level of consciousness: awake and alert Pain management: pain level controlled Vital Signs Assessment: post-procedure vital signs reviewed and stable Respiratory status: spontaneous breathing, nonlabored ventilation, respiratory function stable and patient connected to nasal cannula oxygen Cardiovascular status: blood pressure returned to baseline and stable Postop Assessment: no apparent nausea or vomiting Anesthetic complications: no   No notable events documented.   Last Vitals:  Vitals:   01/30/22 1200 01/30/22 1215  BP: (!) 102/57 122/83  Pulse: 72 71  Resp: 15 15  Temp:    SpO2: 95% 92%    Last Pain:  Vitals:   01/30/22 1215  TempSrc:   PainSc: 0-No pain                 Arita Miss

## 2022-01-30 NOTE — Transfer of Care (Signed)
Immediate Anesthesia Transfer of Care Note  Patient: Shelly Hanson  Procedure(s) Performed: RIGHT GREAT TOE CHEILECTOMY WITH AIKEN'S OSTEOTOMY (Right: Foot)  Patient Location: PACU  Anesthesia Type:General  Level of Consciousness: awake, alert  and oriented  Airway & Oxygen Therapy: Patient Spontanous Breathing and Patient connected to face mask oxygen  Post-op Assessment: Report given to RN and Post -op Vital signs reviewed and stable  Post vital signs: Reviewed and stable  Last Vitals:  Vitals Value Taken Time  BP 136/94 01/30/22 1142  Temp 36.7 C 01/30/22 1142  Pulse 76 01/30/22 1144  Resp 12 01/30/22 1144  SpO2 100 % 01/30/22 1144  Vitals shown include unvalidated device data.  Last Pain:  Vitals:   01/30/22 0830  TempSrc: Temporal  PainSc: 5       Patients Stated Pain Goal: 3 (14/43/15 4008)  Complications: No notable events documented.

## 2022-01-30 NOTE — Anesthesia Procedure Notes (Signed)
Procedure Name: LMA Insertion Date/Time: 01/30/2022 10:14 AM  Performed by: Demetrius Charity, CRNAPre-anesthesia Checklist: Patient identified, Patient being monitored, Timeout performed, Emergency Drugs available and Suction available Patient Re-evaluated:Patient Re-evaluated prior to induction Oxygen Delivery Method: Circle system utilized Preoxygenation: Pre-oxygenation with 100% oxygen Induction Type: IV induction Ventilation: Mask ventilation without difficulty LMA: LMA inserted LMA Size: 4.0 Tube type: Oral Number of attempts: 1 Placement Confirmation: positive ETCO2 and breath sounds checked- equal and bilateral Tube secured with: Tape Dental Injury: Teeth and Oropharynx as per pre-operative assessment

## 2022-01-30 NOTE — H&P (Signed)
HISTORY AND PHYSICAL INTERVAL NOTE:  01/30/2022  9:41 AM  Shelly Hanson  has presented today for surgery, with the diagnosis of M79.761 - Acute foot pain, right M20.11 - Hallux valgus of right foot M20.21 - Hallux rigidus of right foot.  The various methods of treatment have been discussed with the patient.  No guarantees were given.  After consideration of risks, benefits and other options for treatment, the patient has consented to surgery.  I have reviewed the patients' chart and labs.     A history and physical examination was performed in my office.  The patient was reexamined.  There have been no changes to this history and physical examination.  Shelly Hanson A

## 2022-02-03 ENCOUNTER — Encounter: Payer: Self-pay | Admitting: Podiatry

## 2022-02-05 ENCOUNTER — Encounter: Payer: Self-pay | Admitting: Podiatry

## 2022-02-19 ENCOUNTER — Telehealth (INDEPENDENT_AMBULATORY_CARE_PROVIDER_SITE_OTHER): Payer: Self-pay | Admitting: *Deleted

## 2022-02-19 ENCOUNTER — Other Ambulatory Visit (INDEPENDENT_AMBULATORY_CARE_PROVIDER_SITE_OTHER): Payer: Self-pay | Admitting: Gastroenterology

## 2022-02-19 DIAGNOSIS — K219 Gastro-esophageal reflux disease without esophagitis: Secondary | ICD-10-CM

## 2022-02-19 DIAGNOSIS — Z8719 Personal history of other diseases of the digestive system: Secondary | ICD-10-CM

## 2022-02-19 MED ORDER — LINACLOTIDE 290 MCG PO CAPS: 290.0000 ug | ORAL_CAPSULE | Freq: Every day | ORAL | 3 refills | Status: DC

## 2022-02-19 MED ORDER — DICYCLOMINE HCL 10 MG PO CAPS: 10.0000 mg | ORAL_CAPSULE | Freq: Three times a day (TID) | ORAL | 3 refills | Status: DC

## 2022-02-19 NOTE — Telephone Encounter (Signed)
Pt requesting a refill on dicyclomine and linzess to [facility]. Last seen 11/24/21

## 2022-02-19 NOTE — Telephone Encounter (Signed)
Patient notified

## 2022-03-03 ENCOUNTER — Encounter (INDEPENDENT_AMBULATORY_CARE_PROVIDER_SITE_OTHER): Payer: Self-pay | Admitting: Gastroenterology

## 2022-05-04 ENCOUNTER — Other Ambulatory Visit (INDEPENDENT_AMBULATORY_CARE_PROVIDER_SITE_OTHER): Payer: Self-pay | Admitting: Gastroenterology

## 2022-05-04 DIAGNOSIS — K219 Gastro-esophageal reflux disease without esophagitis: Secondary | ICD-10-CM

## 2022-05-04 DIAGNOSIS — Z8719 Personal history of other diseases of the digestive system: Secondary | ICD-10-CM

## 2022-11-24 ENCOUNTER — Other Ambulatory Visit (HOSPITAL_COMMUNITY): Payer: Self-pay | Admitting: Nurse Practitioner

## 2022-11-24 DIAGNOSIS — Z1231 Encounter for screening mammogram for malignant neoplasm of breast: Secondary | ICD-10-CM

## 2022-11-26 ENCOUNTER — Ambulatory Visit (INDEPENDENT_AMBULATORY_CARE_PROVIDER_SITE_OTHER): Payer: Medicare HMO | Admitting: Gastroenterology

## 2022-12-07 ENCOUNTER — Encounter (HOSPITAL_COMMUNITY): Payer: Self-pay

## 2022-12-07 ENCOUNTER — Ambulatory Visit (HOSPITAL_COMMUNITY)
Admission: RE | Admit: 2022-12-07 | Discharge: 2022-12-07 | Disposition: A | Discharge status: Home / Self Care | Payer: Medicare HMO | Source: Ambulatory Visit | Attending: Nurse Practitioner | Admitting: Nurse Practitioner

## 2022-12-07 ENCOUNTER — Ambulatory Visit (INDEPENDENT_AMBULATORY_CARE_PROVIDER_SITE_OTHER): Payer: Medicare HMO | Admitting: Gastroenterology

## 2022-12-07 DIAGNOSIS — Z1231 Encounter for screening mammogram for malignant neoplasm of breast: Secondary | ICD-10-CM | POA: Insufficient documentation

## 2022-12-12 ENCOUNTER — Other Ambulatory Visit (INDEPENDENT_AMBULATORY_CARE_PROVIDER_SITE_OTHER): Payer: Self-pay | Admitting: Gastroenterology

## 2022-12-14 NOTE — Telephone Encounter (Signed)
Must keep September 2024 appointment for further refills.

## 2023-01-11 ENCOUNTER — Ambulatory Visit (INDEPENDENT_AMBULATORY_CARE_PROVIDER_SITE_OTHER): Payer: Medicare HMO | Admitting: Gastroenterology

## 2023-02-16 ENCOUNTER — Other Ambulatory Visit (INDEPENDENT_AMBULATORY_CARE_PROVIDER_SITE_OTHER): Payer: Self-pay | Admitting: Gastroenterology

## 2023-02-16 NOTE — Telephone Encounter (Signed)
Must keep 01/2023 appointment for further refills.

## 2023-02-18 ENCOUNTER — Encounter (INDEPENDENT_AMBULATORY_CARE_PROVIDER_SITE_OTHER): Payer: Self-pay | Admitting: Gastroenterology

## 2023-02-18 ENCOUNTER — Ambulatory Visit (INDEPENDENT_AMBULATORY_CARE_PROVIDER_SITE_OTHER): Payer: Medicare HMO | Admitting: Gastroenterology

## 2023-02-18 VITALS — BP 113/79 | HR 80 | Temp 97.7°F | Ht 65.0 in | Wt 212.8 lb

## 2023-02-18 DIAGNOSIS — Z8719 Personal history of other diseases of the digestive system: Secondary | ICD-10-CM | POA: Diagnosis not present

## 2023-02-18 DIAGNOSIS — K219 Gastro-esophageal reflux disease without esophagitis: Secondary | ICD-10-CM

## 2023-02-18 NOTE — Patient Instructions (Addendum)
Continue linzess daily  Increase water intake, aim for atleast 64 oz per day Increase fruits, veggies and whole grains, kiwi and prunes are especially good for constipation Be mindful of dicyclomine as this can worsen constipation  Start pantoprazole 40mg  daily, continue famotidine 20mg  at bedtime Avoid greasy, spicy, fried, citrus foods, and be mindful that caffeine, carbonated drinks, chocolate and alcohol can increase reflux symptoms Stay upright 2-3 hours after eating, prior to lying down and avoid eating late in the evenings.  Follow up 1 year   It was a pleasure to see you today. I want to create trusting relationships with patients and provide genuine, compassionate, and quality care. I truly value your feedback! please be on the lookout for a survey regarding your visit with me today. I appreciate your input about our visit and your time in completing this!    Puneet Masoner L. Jeanmarie Hubert, MSN, APRN, AGNP-C Adult-Gerontology Nurse Practitioner [facility] at [facility]

## 2023-02-18 NOTE — Progress Notes (Signed)
TCS noted in recall for 10/2023

## 2023-02-18 NOTE — Progress Notes (Addendum)
Referring Provider: Erasmo Downer, NP Primary Care Physician:  Erasmo Downer, NP Primary GI Physician: Dr. Levon Hedger   Chief Complaint  Patient presents with   Constipation    Follow up on constipation. States doing well since taking linzess daily    Gastroesophageal Reflux    Follow up on GERD. Saw pcp for acid reflux and med was changed to pantoprazole 40mg  yesterday and has not started it yet. And takes famotidine every day.    HPI:   Shelly Hanson is a 62 y.o. female  with past medical history of GERD, hyperlipidemia, IBS, chronic constipation   Patient presenting today for follow up of GERD and IBS-C.  Last seen August 2023, at that time time constipation well-managed taking Linzess 145 mcg twice daily.  Having a BM most days though occasionally going 2 days without having a BM port.  Occasionally having to strain or having small stool pellets.  Having some occasional right lower quadrant pain for many years.  Pain sometimes improves with a BM, taking dicyclomine 3 times daily which also helps her pain.  Did not complete previous recommended anorectal manometry and defecography as she did not feel that she needed further testing given Linzess is working.  She reported waking up a few times feel like she could not catch her breath with acid in her throat, this has occurred 3 times over the past few months.  Patient recommended to continue Linzess 290 mcg daily, increase water intake, continue omeprazole 40 mg daily, famotidine 20 mg nightly, Bentyl 3 times daily.   Present: Taking linzess daily, states on average having 1 BM per week which is normal for her, however, if she eats something she shouldn't have since she does not have a gallbladder, she will have more than 1 BM in a day but denies diarrhea. She states that even she only goes to the bathroom once a week she denies needing to strain to defecate. Notes that PCP encouraged her to increase her water intake.  Denies rectal bleeding or melena. She is taking dicyclomine TID which seems to work well for her.   She states she is on nortriptyline that seems to cause her to have nausea. She notes dose was recently changed from 50mg  to 75mg  and this is when nausea began. Appetite is good. She is trying to lose some weight.   She reports PCP switched her from omeprazole to pantoprazole as she was having acid regurgitation at night, she was having to increase her omeprazole to twice daily. She has not yet started pantoprazole yet but has plans to start today. She is still taking famotidine 20mg  around 1700.   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 This stricture was dilated with balloon dilator to 18 mm. Moderate size sliding hiatal hernia. Erosive antral gastritis  Last Colonoscopy: 2015 normal    Past Medical History:  Diagnosis Date   Abdominal wall pain in right lower quadrant 09/04/2014   Arthritis    Family history of adverse reaction to anesthesia    brother has hard time going to sleep   GERD (gastroesophageal reflux disease)    High cholesterol    Hyperlipemia    IBS (irritable bowel syndrome)    Lumbar facet joint syndrome    Migraine headache 12/18/2014   Pinched nerve    pinched nerve in back    Pinched nerve in neck    PONV (postoperative nausea and vomiting)    Sciatic  leg pain    Vitamin D deficiency disease     Past Surgical History:  Procedure Laterality Date   BALLOON DILATION N/A 11/17/2013   Procedure: BALLOON DILATION;  Surgeon: Malissa Hippo, MD;  [facility];  Service: Endoscopy;  Laterality: N/A;   CARPAL TUNNEL RELEASE     CHEILECTOMY Right 01/30/2022   Procedure: RIGHT GREAT TOE CHEILECTOMY WITH AIKEN'S OSTEOTOMY;  Surgeon: Gwyneth Revels, DPM;  Location: [facility];  Service: Podiatry;  Laterality: Right;   CHOLECYSTECTOMY     COLONOSCOPY N/A 11/17/2013   Procedure: COLONOSCOPY;  Surgeon: Malissa Hippo, MD;   Location: [facility];  Service: Endoscopy;  Laterality: N/A;  1030   DILATION AND CURETTAGE OF UTERUS     x 2    ESOPHAGOGASTRODUODENOSCOPY N/A 11/17/2013   Procedure: ESOPHAGOGASTRODUODENOSCOPY (EGD);  Surgeon: Malissa Hippo, MD;  Location: [facility];  Service: Endoscopy;  Laterality: N/A;   FOOT SURGERY     for a fx.    FRACTURE SURGERY     rt arm x 2 , left arm x1   [name] N/A 11/17/2013   Procedure: [name];  Surgeon: Malissa Hippo, MD;  Location: [facility];  Service: Endoscopy;  Laterality: N/A;   SAVORY DILATION N/A 11/17/2013   Procedure: SAVORY DILATION;  Surgeon: Malissa Hippo, MD;  Location: [facility];  Service: Endoscopy;  Laterality: N/A;   SKIN GRAFT      Current Outpatient Medications  Medication Sig Dispense Refill   linaclotide (LINZESS) 290 MCG CAPS capsule TAKE 1 CAPSULE EVERY DAY BEFORE BREAKFAST 90 capsule 0   pantoprazole sodium (PROTONIX) 40 mg Take 40 mg by mouth daily.     dicyclomine (BENTYL) 10 MG capsule TAKE 1 CAPSULE THREE TIMES DAILY BEFORE MEALS 270 capsule 3   docusate sodium (COLACE) 100 MG capsule Take 100 mg by mouth daily. Takes on M,W,F (Patient not taking: Reported on 11/24/2021)     famotidine (PEPCID) 20 MG tablet Take 1 tablet (20 mg total) by mouth at bedtime as needed for heartburn or indigestion. Take at 1700 (Patient taking differently: Take 20 mg by mouth daily. Take at 1700) 90 tablet 3   FENOFIBRATE PO Take 148 mg by mouth daily. Take 1/2 pill each day     gabapentin (NEURONTIN) 100 MG capsule Take 200 mg by mouth 3 (three) times daily.      meloxicam (MOBIC) 15 MG tablet Take 15 mg by mouth daily. Take at noon     Multiple Vitamins-Minerals (ICAPS AREDS 2 PO) Take by mouth. One po BID     nortriptyline (PAMELOR) 50 MG capsule Take 50 mg by mouth daily at 6 (six) AM.     omeprazole (PRILOSEC) 40 MG capsule Take 1 capsule (40 mg total) by mouth daily before breakfast. (Patient not taking: Reported on  02/18/2023) 90 capsule 3   ondansetron (ZOFRAN-ODT) 8 MG disintegrating tablet DISSOLVE 1 TABLET BY MOUTH ONCE EVERY 8 HOURS AS NEEDED FOR NAUSEA & VOMITING 30 tablet 2   oxyCODONE-acetaminophen (PERCOCET) 5-325 MG tablet Take 1-2 tablets by mouth every 6 (six) hours as needed for severe pain. Max 6 tabs per day 30 tablet 0   RIZATRIPTAN BENZOATE PO Take 10 mg by mouth as needed for migraine. May repeat in 2 hours if needed     rosuvastatin (CRESTOR) 20 MG tablet Take 20 mg by mouth daily.     Vitamin D, Ergocalciferol, (DRISDOL) 50000 UNITS CAPS capsule Take 50,000  Units by mouth every 7 (seven) days. Takes on Saturdays.     No current facility-administered medications for this visit.    Allergies as of 02/18/2023 - Review Complete 02/18/2023  Allergen Reaction Noted   Amoxicillin  06/02/2018   Codeine  10/12/2013   Elemental sulfur  05/25/2018   Flagyl [metronidazole]  10/12/2013   Naproxen  11/22/2014   Nitrofurantoin Other (See Comments) 04/27/2017   Sulfa antibiotics  10/12/2013    Family History  Problem Relation Age of Onset   Colon cancer Father    Hypertension Father    Seizures Father    Cancer Father    Depression Mother    Diabetes Mother    Hyperlipidemia Mother    Hyperlipidemia Brother    Heart disease Brother    Hyperlipidemia Brother     Social History   Socioeconomic History   Marital status: Married    Spouse name: Izora Gala   Number of children: Not on file   Years of education: Not on file   Highest education level: Not on file  Occupational History   Not on file  Tobacco Use   Smoking status: Every Day    Current packs/day: 0.25    Average packs/day: 0.3 packs/day for 10.0 years (2.5 ttl pk-yrs)    Types: Cigarettes   Smokeless tobacco: Never   Tobacco comments:    1 pack every 3 days greater than 10 yrs  Vaping Use   Vaping status: Never Used  Substance and Sexual Activity   Alcohol use: No   Drug use: No   Sexual activity: Yes    Birth  control/protection: Post-menopausal  Other Topics Concern   Not on file  Social History Narrative   Not on file   Social Determinants of Health   Financial Resource Strain: Not on file  Food Insecurity: Not on file  Transportation Needs: Not on file  Physical Activity: Not on file  Stress: Not on file  Social Connections: Not on file    Review of systems General: negative for malaise, night sweats, fever, chills, weight loss Neck: Negative for lumps, goiter, pain and significant neck swelling Resp: Negative for cough, wheezing, dyspnea at rest CV: Negative for chest pain, leg swelling, palpitations, orthopnea GI: denies melena, hematochezia, nausea, vomiting, diarrhea, constipation, dysphagia, odyonophagia, early satiety or unintentional weight loss.  MSK: Negative for joint pain or swelling, back pain, and muscle pain. Derm: Negative for itching or rash Psych: Denies depression, anxiety, memory loss, confusion. No homicidal or suicidal ideation.  Heme: Negative for prolonged bleeding, bruising easily, and swollen nodes. Endocrine: Negative for cold or heat intolerance, polyuria, polydipsia and goiter. Neuro: negative for tremor, gait imbalance, syncope and seizures. The remainder of the review of systems is noncontributory.  Physical Exam: There were no vitals taken for this visit. General:   Alert and oriented. No distress noted. Pleasant and cooperative.  Head:  Normocephalic and atraumatic. Eyes:  Conjuctiva clear without scleral icterus. Mouth:  Oral mucosa pink and moist. Good dentition. No lesions. Heart: Normal rate and rhythm, s1 and s2 heart sounds present.  Lungs: Clear lung sounds in all lobes. Respirations equal and unlabored. Abdomen:  +BS, soft, non-tender and non-distended. No rebound or guarding. No HSM or masses noted. Derm: No palmar erythema or jaundice Msk:  Symmetrical without gross deformities. Normal posture. Extremities:  Without edema. Neurologic:   Alert and  oriented x4 Psych:  Alert and cooperative. Normal mood and affect.  Invalid input(s): "6 MONTHS"  ASSESSMENT: Shelly Hanson is a 62 y.o. female presenting today for follow up of GERD and IBS  GERD: She has been on omeprazole 40 mg daily, famotidine 20 mg in the evening.  Notes she was having more acid regurgitation waking her up at night.  PCP prescribed her pantoprazole 40 mg daily which she is planning to start today.  She can continue famotidine 20 mg nightly.  Instructed on good reflux precautions.  She should let me know if pantoprazole is not controlling her GERD symptoms.  Long history of constipation, currently on Linzess 290 mcg daily.  Notes she is having a bowel movement usually once per week, however denies straining, abdominal pain or feeling constipated.  She been previously recommended to have anorectal manometry and defecography which she declined to do as she was feeling better on Linzess.  At this time she feels her current regimen is keeping things well-controlled she does not wish to add any further laxatives to her bowel regimen.  Will continue with Linzess 290 mcg daily, advised her to be mindful that frequent use of dicyclomine can worsen her constipation.  She should try to increase water intake to at least 64 ounces per day, increase fruits, veggies, whole grains in her diet.  She will let me know if she has further issues with constipation.  Last colonoscopy in 2015 was normal. Per chart review, supposed to repeat Colonoscopy in 5 years due to personal history of polyps and family history of CRC, however, patient denies any family history of CRC or personal history of polyps. therefore will be due for colonoscopy in July 2025.    PLAN:  Continue linzess daily  2. Increase water intake, aim for atleast 64 oz per day Increase fruits, veggies and whole grains, kiwi and prunes are especially good for constipation 3. Start pantoprazole 40mg  daily,  continue famotidine 20mg  at bedtime 4. Good reflux precautions 5. Be mindful of dicyclomine as this can worsen constipation.   All questions were answered, patient verbalized understanding and is in agreement with plan as outlined above.    Follow Up: 1 year  Daeshawn Redmann L. Jeanmarie Hubert, MSN, APRN, AGNP-C Adult-Gerontology Nurse Practitioner [facility]  I have reviewed the note and agree with the APP's assessment as described in this progress note  If presenting persistent constipation, can consider adding up MiraLAX to current Linzess regimen.  Katrinka Blazing, MD Gastroenterology and Hepatology [facility]

## 2023-02-25 ENCOUNTER — Other Ambulatory Visit (INDEPENDENT_AMBULATORY_CARE_PROVIDER_SITE_OTHER): Payer: Self-pay | Admitting: Gastroenterology

## 2023-02-25 DIAGNOSIS — K219 Gastro-esophageal reflux disease without esophagitis: Secondary | ICD-10-CM

## 2023-02-25 DIAGNOSIS — Z8719 Personal history of other diseases of the digestive system: Secondary | ICD-10-CM

## 2023-02-25 NOTE — Telephone Encounter (Signed)
Last visit 02/18/23

## 2023-05-31 ENCOUNTER — Other Ambulatory Visit (INDEPENDENT_AMBULATORY_CARE_PROVIDER_SITE_OTHER): Payer: Self-pay | Admitting: Gastroenterology

## 2023-09-24 ENCOUNTER — Encounter (INDEPENDENT_AMBULATORY_CARE_PROVIDER_SITE_OTHER): Payer: Self-pay | Admitting: *Deleted

## 2023-12-14 ENCOUNTER — Other Ambulatory Visit (HOSPITAL_COMMUNITY): Payer: Self-pay | Admitting: Nurse Practitioner

## 2023-12-14 DIAGNOSIS — Z1231 Encounter for screening mammogram for malignant neoplasm of breast: Secondary | ICD-10-CM

## 2023-12-16 ENCOUNTER — Ambulatory Visit (HOSPITAL_COMMUNITY)
Admission: RE | Admit: 2023-12-16 | Discharge: 2023-12-16 | Disposition: A | Discharge status: Home / Self Care | Source: Ambulatory Visit | Attending: Nurse Practitioner | Admitting: Nurse Practitioner

## 2023-12-16 ENCOUNTER — Encounter (HOSPITAL_COMMUNITY): Payer: Self-pay

## 2023-12-16 DIAGNOSIS — Z1231 Encounter for screening mammogram for malignant neoplasm of breast: Secondary | ICD-10-CM | POA: Diagnosis present

## 2024-02-02 ENCOUNTER — Encounter (INDEPENDENT_AMBULATORY_CARE_PROVIDER_SITE_OTHER): Payer: Self-pay | Admitting: Gastroenterology

## 2024-02-09 ENCOUNTER — Telehealth (INDEPENDENT_AMBULATORY_CARE_PROVIDER_SITE_OTHER): Payer: Self-pay

## 2024-02-09 NOTE — Telephone Encounter (Signed)
 Patient states she received a letter from us  stating it was time to do a TCS. She has perform a stool testing, which she says was a home colonoscopy and it was negative.I asked her to bring those results by the office for our review and we will let her know if it is still recommended to have a Colonoscopy. Patient states understanding and says she will bring the test results by for our review. (Patient not sure if this was a cologuard).   Letter below:  09/24/23  Shantella Blubaugh   [address] [place] [address]  [phone]  Our records indicate it is time for your repeat colonoscopy.   Please fill out the questionnaire and mail back to our office at [address] [place] [address].  We will call you to schedule after we received questionnaire.  We cannot schedule the colonoscopy with out this information      [facility]enter[facility] at [facility] WILL NEED TO CHECK WITH YOUR INSURANCE COMPANY FOR THE BENEFITS OF COVERAGE YOU HAVE FOR THIS PROCEDURE.  UNFORTUNATELY, NOT ALL INSURANCE COMPANIES HAVE BENEFITS TO COVER ALL OR PART OF THESE TYPES OF PROCEDURES.  IT IS YOUR RESPONSIBILITY TO CHECK YOUR BENEFITS, HOWEVER, WE WILL BE GLAD TO ASSIST YOU WITH ANY CODES YOUR INSURANCE COMPANY MAY NEED.    PLEASE NOTE THAT MOST INSURANCE COMPANIES WILL NOT COVER A SCREENING COLONOSCOPY FOR PEOPLE UNDER THE AGE OF 50  IF YOU HAVE [employer] INSURANCE, YOU MAY HAVE BENEFITS FOR A SCREENING COLONOSCOPY BUT IF POLYPS ARE FOUND THE DIAGNOSIS WILL CHANGE AND THEN YOU MAY HAVE A DEDUCTIBLE THAT WILL NEED TO BE MET. SO PLEASE MAKE SURE YOU CHECK YOUR BENEFITS FOR A SCREENING COLONOSCOPY AS WELL AS A DIAGNOSTIC COLONOSCOPY.

## 2024-02-09 NOTE — Telephone Encounter (Signed)
 I spoke with the patient and made her aware per [facility], I can review the cologuard however, she has a family history of CRC in her father at age 65, therefore she is at higher risk for possibly developing CRC, cologuard is actually not recommended in patients with above average risk (personal/family history of CRC or personal polyp history) due to the high rate of inaccuracy. I would recommend a colonoscopy either way in her case.  Patient states understanding and states she will call the office back to schedule the Colonoscopy once she gets her schedule straight.

## 2024-02-21 ENCOUNTER — Ambulatory Visit (INDEPENDENT_AMBULATORY_CARE_PROVIDER_SITE_OTHER): Payer: Medicare HMO | Admitting: Gastroenterology

## 2024-03-02 ENCOUNTER — Other Ambulatory Visit (INDEPENDENT_AMBULATORY_CARE_PROVIDER_SITE_OTHER): Payer: Self-pay | Admitting: Gastroenterology

## 2024-03-02 DIAGNOSIS — K219 Gastro-esophageal reflux disease without esophagitis: Secondary | ICD-10-CM

## 2024-03-02 DIAGNOSIS — Z8719 Personal history of other diseases of the digestive system: Secondary | ICD-10-CM

## 2024-03-27 ENCOUNTER — Ambulatory Visit (INDEPENDENT_AMBULATORY_CARE_PROVIDER_SITE_OTHER): Admitting: Gastroenterology

## 2024-05-09 ENCOUNTER — Encounter (INDEPENDENT_AMBULATORY_CARE_PROVIDER_SITE_OTHER): Payer: Self-pay | Admitting: Gastroenterology

## 2024-05-09 ENCOUNTER — Ambulatory Visit (INDEPENDENT_AMBULATORY_CARE_PROVIDER_SITE_OTHER): Admitting: Gastroenterology

## 2024-05-09 VITALS — BP 104/70 | HR 94 | Temp 96.8°F | Ht 65.0 in | Wt 202.0 lb

## 2024-05-09 DIAGNOSIS — K581 Irritable bowel syndrome with constipation: Secondary | ICD-10-CM

## 2024-05-09 DIAGNOSIS — K219 Gastro-esophageal reflux disease without esophagitis: Secondary | ICD-10-CM | POA: Diagnosis not present

## 2024-05-09 DIAGNOSIS — Z1211 Encounter for screening for malignant neoplasm of colon: Secondary | ICD-10-CM

## 2024-05-09 MED ORDER — LINACLOTIDE 290 MCG PO CAPS: 290.0000 ug | ORAL_CAPSULE | Freq: Every day | ORAL | 3 refills | Status: AC

## 2024-05-09 NOTE — Progress Notes (Signed)
 "  Referring Provider: Johnson Morna FALCON, NP Primary Care Physician:  Johnson Morna FALCON, NP Primary GI Physician: Previously Dr. Eartha (Dr. Cinderella)  Chief Complaint  Patient presents with   Follow-up    Pt arrives for follow up. Pt states no issues at this time.    HPI:   Shelly Hanson is a 64 y.o. female with past medical history of GERD, hyperlipidemia, IBS, chronic constipation   Patient presenting today for:  Follow up of GERD and IBS-C Encounter for screening colonoscopy   Last seen October 2024, at that time taking linzess  290mcg daily, 1 BM per week, sometimes more if she eats something greasy given previous cholecystectomy. Reporting nausea with recent dose increase of nortriptyline, PCP changed PPI to protonix  as she had more regurgitation on omeprazole  but had not yet started it, taking famotidine  20mg  at bedtime.  Recommended to continue linzess , increase water  intake, start protonix  and continue famotidine , be mindful dicyclomine  can worsen constipation, consider addition of miralax if needed   Present:  Taking linzess  in the evenings after 5pm. She takes dicyclomine  3 times per day. Has looser stools sometimes on linzess , having on average a few stools per week. she tries to eat a lot of greens and salads which seems to help.  No abdominal pain. Appetite is good.   She did a fecal occult stool test that was negative in September. Her husband was in an accident in June and she was unable to do colonoscopy that was due last July. Denies rectal bleeding or melena.   GERD doing well on protonix  40mg  daily and famotidine  20mg  in the evenings. No dysphagia or odynophagia at current but states she had a new medication started recently for pain which caused her to have issues with swallowing but this has improved since she stopped it.    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 This stricture was dilated with balloon dilator to 18  mm. Moderate size sliding hiatal hernia. Erosive antral gastritis Last Colonoscopy: 10/2013 normal   Filed Weights   05/09/24 1107  Weight: 202 lb (91.6 kg)     Past Medical History:  Diagnosis Date   Abdominal wall pain in right lower quadrant 09/04/2014   Arthritis    Family history of adverse reaction to anesthesia    brother has hard time going to sleep   GERD (gastroesophageal reflux disease)    High cholesterol    Hyperlipemia    IBS (irritable bowel syndrome)    Lumbar facet joint syndrome    Migraine headache 12/18/2014   Pinched nerve    pinched nerve in back    Pinched nerve in neck    PONV (postoperative nausea and vomiting)    Sciatic leg pain    Vitamin D  deficiency disease     Past Surgical History:  Procedure Laterality Date   BALLOON DILATION N/A 11/17/2013   Procedure: BALLOON DILATION;  Surgeon: Claudis RAYMOND Rivet, MD;  Location: [facility];  Service: Endoscopy;  Laterality: N/A;   CARPAL TUNNEL RELEASE     CHEILECTOMY Right 01/30/2022   Procedure: RIGHT GREAT TOE CHEILECTOMY WITH AIKEN'S OSTEOTOMY;  Surgeon: Ashley Soulier, DPM;  Location: [facility];  Service: Podiatry;  Laterality: Right;   CHOLECYSTECTOMY     COLONOSCOPY N/A 11/17/2013   Procedure: COLONOSCOPY;  Surgeon: Claudis RAYMOND Rivet, MD;  Location: [facility];  Service: Endoscopy;  Laterality: N/A;  1030   DILATION AND CURETTAGE OF UTERUS  x 2    ESOPHAGOGASTRODUODENOSCOPY N/A 11/17/2013   Procedure: ESOPHAGOGASTRODUODENOSCOPY (EGD);  Surgeon: Claudis RAYMOND Rivet, MD;  Location: [facility];  Service: Endoscopy;  Laterality: N/A;   FOOT SURGERY     for a fx.    FRACTURE SURGERY     rt arm x 2 , left arm x1   [name] N/A 11/17/2013   Procedure: [name];  Surgeon: Claudis RAYMOND Rivet, MD;  Location: [facility];  Service: Endoscopy;  Laterality: N/A;   SAVORY DILATION N/A 11/17/2013   Procedure: SAVORY DILATION;  Surgeon: Claudis RAYMOND Rivet, MD;  Location: [facility];   Service: Endoscopy;  Laterality: N/A;   SKIN GRAFT      Current Outpatient Medications  Medication Sig Dispense Refill   cyclobenzaprine  (FLEXERIL ) 10 MG tablet Take 10 mg by mouth as needed for muscle spasms.     dicyclomine  (BENTYL ) 10 MG capsule TAKE 1 CAPSULE THREE TIMES DAILY BEFORE MEALS 270 capsule 0   famotidine  (PEPCID ) 20 MG tablet Take 1 tablet (20 mg total) by mouth at bedtime as needed for heartburn or indigestion. Take at 1700 90 tablet 3   FENOFIBRATE PO Take 145 mg by mouth daily. Take 1/2 pill each day     gabapentin  (NEURONTIN ) 100 MG capsule Take 200 mg by mouth 3 (three) times daily.      LINZESS  290 MCG CAPS capsule TAKE 1 CAPSULE EVERY DAY BEFORE BREAKFAST 90 capsule 3   meloxicam  (MOBIC ) 15 MG tablet Take 15 mg by mouth daily. Take at noon     nortriptyline (PAMELOR) 75 MG capsule Take 75 mg by mouth daily at 6 (six) AM.     ondansetron  (ZOFRAN -ODT) 8 MG disintegrating tablet DISSOLVE 1 TABLET BY MOUTH ONCE EVERY 8 HOURS AS NEEDED FOR NAUSEA & VOMITING 30 tablet 2   pantoprazole  sodium (PROTONIX ) 40 mg Take 40 mg by mouth daily.     RIZATRIPTAN BENZOATE PO Take 5 mg by mouth as needed for migraine. May repeat in 2 hours if needed     rosuvastatin (CRESTOR) 20 MG tablet Take 20 mg by mouth daily.     Vitamin D , Ergocalciferol , (DRISDOL) 50000 UNITS CAPS capsule Take 50,000 Units by mouth every 7 (seven) days. Takes on Saturdays.     Multiple Vitamins-Minerals (ICAPS AREDS 2 PO) Take by mouth. One po BID (Patient not taking: Reported on 05/09/2024)     No current facility-administered medications for this visit.    Allergies as of 05/09/2024 - Review Complete 05/09/2024  Allergen Reaction Noted   Amoxicillin   06/02/2018   Codeine  10/12/2013   Elemental sulfur  05/25/2018   Flagyl [metronidazole]  10/12/2013   Naproxen  11/22/2014   Nitrofurantoin Other (See Comments) 04/27/2017   Sulfa antibiotics  10/12/2013    Social History   Socioeconomic History    Marital status: Married    Spouse name: Arlon   Number of children: Not on file   Years of education: Not on file   Highest education level: Not on file  Occupational History   Not on file  Tobacco Use   Smoking status: Every Day    Current packs/day: 0.25    Average packs/day: 0.3 packs/day for 10.0 years (2.5 ttl pk-yrs)    Types: Cigarettes   Smokeless tobacco: Never   Tobacco comments:    1 pack every 3 days greater than 10 yrs  Vaping Use   Vaping status: Never Used  Substance and Sexual Activity  Alcohol use: No   Drug use: No   Sexual activity: Yes    Birth control/protection: Post-menopausal  Other Topics Concern   Not on file  Social History Narrative   Not on file   Social Drivers of Health   Tobacco Use: High Risk (05/09/2024)   Patient History    Smoking Tobacco Use: Every Day    Smokeless Tobacco Use: Never    Passive Exposure: Not on file  Financial Resource Strain: Not on file  Food Insecurity: Not on file  Transportation Needs: Not on file  Physical Activity: Not on file  Stress: Not on file  Social Connections: Not on file  Depression (EYV7-0): Not on file  Alcohol Screen: Not on file  Housing: Not on file  Utilities: Not on file  Health Literacy: Not on file    Review of systems General: negative for malaise, night sweats, fever, chills, weight loss Neck: Negative for lumps, goiter, pain and significant neck swelling Resp: Negative for cough, wheezing, dyspnea at rest CV: Negative for chest pain, leg swelling, palpitations, orthopnea GI: denies melena, hematochezia, nausea, vomiting, diarrhea, dysphagia, odyonophagia, early satiety or unintentional weight loss. +constipation  MSK: Negative for joint pain or swelling, back pain, and muscle pain. Derm: Negative for itching or rash Psych: Denies depression, anxiety, memory loss, confusion. No homicidal or suicidal ideation.  Heme: Negative for prolonged bleeding, bruising easily, and swollen  nodes. Endocrine: Negative for cold or heat intolerance, polyuria, polydipsia and goiter. Neuro: negative for tremor, gait imbalance, syncope and seizures. The remainder of the review of systems is noncontributory.  Physical Exam: BP 104/70   Pulse 94   Temp (!) 96.8 F (36 C)   Ht 5' 5 (1.651 m)   Wt 202 lb (91.6 kg)   BMI 33.61 kg/m  General:   Alert and oriented. No distress noted. Pleasant and cooperative.  Head:  Normocephalic and atraumatic. Eyes:  Conjuctiva clear without scleral icterus. Mouth:  Oral mucosa pink and moist. Good dentition. No lesions. Heart: Normal rate and rhythm, s1 and s2 heart sounds present.  Lungs: Clear lung sounds in all lobes. Respirations equal and unlabored. Abdomen:  +BS, soft, non-tender and non-distended. No rebound or guarding. No HSM or masses noted. Derm: No palmar erythema or jaundice Msk:  Symmetrical without gross deformities. Normal posture. Extremities:  Without edema. Neurologic:  Alert and  oriented x4 Psych:  Alert and cooperative. Normal mood and affect.  Invalid input(s): 6 MONTHS   ASSESSMENT: Cambreigh Dearing is a 64 y.o. female presenting today for follow up of GERD, IBS-C and encounter for screening colonoscopy   IBS-C: doing pretty well on linzess  290mcg daily, bentyl  TID before meals. No abdominal pain. Having a few stools per week, sometimes looser. She tries to eat more greens and salads which help her move her bowels. Taking linzess  in the evening, recommended she try taking in the morning on empty stomach for best results. Will continue with current bowel regimen for now, encouraged good water  intake.   GERD: well managed on protonix  40mg  and famotidine  20mg  daily without breakthrough or dysphagia. Will continue with current regimen.   Encounter for screening colonoscopy: last TCS in 2015 which was normal, due last July but was unable to schedule to her husband being involved in an accident. She did an occult stool  test which was negative in September. She is agreeable to get colonoscopy scheduled once she finds a ride to drive her home afterwards, she will make me aware  when she is ready to schedule.   PLAN:  -continue linzess  , try taking in the morning before breakfast on empty stomach  -can continue with dicyclomine  10mg  TID before meals -Increase water  intake, aim for atleast 64 oz per day -Increase fruits, veggies and whole grains, kiwi and prunes are especially good for constipation -continue protonix  40mg  daily, famotidine  20mg  QHS -schedule colonoscopy, pt will let me know when she is able to schedule   All questions were answered, patient verbalized understanding and is in agreement with plan as outlined above.   Follow Up: 1 year   [place] Giammona L. Leandria Thier, MSN, APRN, AGNP-C Adult-Gerontology Nurse Practitioner [facility]  "

## 2024-05-09 NOTE — Patient Instructions (Signed)
-  continue linzess  290mcg, try taking in the morning before breakfast -can continue with dicyclomine  10mg  up to three times daily before meals -Increase water  intake, aim for atleast 64 oz per day Increase fruits, veggies and whole grains, kiwi and prunes are especially good for constipation -continue protonix  40mg  daily -schedule colonoscopy, let me know when she is ready to schedule   Follow up 1 year

## 2024-05-18 ENCOUNTER — Other Ambulatory Visit (INDEPENDENT_AMBULATORY_CARE_PROVIDER_SITE_OTHER): Payer: Self-pay | Admitting: Gastroenterology

## 2024-05-18 DIAGNOSIS — K219 Gastro-esophageal reflux disease without esophagitis: Secondary | ICD-10-CM

## 2024-05-18 DIAGNOSIS — Z8719 Personal history of other diseases of the digestive system: Secondary | ICD-10-CM

## 2024-05-23 ENCOUNTER — Telehealth (INDEPENDENT_AMBULATORY_CARE_PROVIDER_SITE_OTHER): Payer: Self-pay | Admitting: Gastroenterology

## 2024-05-23 NOTE — Telephone Encounter (Signed)
 Prior authorization started for Dicyclomine  10 mg capsule. Will need provider assistance with filling out

## 2024-05-24 ENCOUNTER — Other Ambulatory Visit (INDEPENDENT_AMBULATORY_CARE_PROVIDER_SITE_OTHER): Payer: Self-pay | Admitting: Gastroenterology

## 2024-05-24 NOTE — Telephone Encounter (Signed)
 Fax received from [facility] with approval of Dicyclomine  10 mg capsule. Authorization is good until 04/19/2025

## 2024-05-24 NOTE — Telephone Encounter (Signed)
PA completed and sent to plan.
